# Patient Record
Sex: Female | Born: 1975 | Race: White | Hispanic: No | Smoking: Current every day smoker
Health system: Southern US, Community
[De-identification: ages and names within clinical notes are randomized; demographics above are authoritative.]

## PROBLEM LIST (undated history)

## (undated) DIAGNOSIS — M51369 Other intervertebral disc degeneration, lumbar region without mention of lumbar back pain or lower extremity pain: Secondary | ICD-10-CM

## (undated) DIAGNOSIS — J309 Allergic rhinitis, unspecified: Secondary | ICD-10-CM

## (undated) DIAGNOSIS — M5030 Other cervical disc degeneration, unspecified cervical region: Secondary | ICD-10-CM

## (undated) DIAGNOSIS — F419 Anxiety disorder, unspecified: Secondary | ICD-10-CM

## (undated) DIAGNOSIS — F329 Major depressive disorder, single episode, unspecified: Secondary | ICD-10-CM

## (undated) DIAGNOSIS — I456 Pre-excitation syndrome: Secondary | ICD-10-CM

## (undated) DIAGNOSIS — IMO0002 Reserved for concepts with insufficient information to code with codable children: Secondary | ICD-10-CM

## (undated) DIAGNOSIS — M419 Scoliosis, unspecified: Secondary | ICD-10-CM

## (undated) DIAGNOSIS — M5136 Other intervertebral disc degeneration, lumbar region: Secondary | ICD-10-CM

## (undated) DIAGNOSIS — F32A Depression, unspecified: Secondary | ICD-10-CM

## (undated) DIAGNOSIS — E785 Hyperlipidemia, unspecified: Secondary | ICD-10-CM

## (undated) DIAGNOSIS — G43909 Migraine, unspecified, not intractable, without status migrainosus: Secondary | ICD-10-CM

## (undated) DIAGNOSIS — M797 Fibromyalgia: Secondary | ICD-10-CM

## (undated) HISTORY — DX: Allergic rhinitis, unspecified: J30.9

## (undated) HISTORY — PX: OTHER SURGICAL HISTORY: SHX169

## (undated) HISTORY — DX: Major depressive disorder, single episode, unspecified: F32.9

## (undated) HISTORY — DX: Hyperlipidemia, unspecified: E78.5

## (undated) HISTORY — DX: Anxiety disorder, unspecified: F41.9

## (undated) HISTORY — PX: KNEE SURGERY: SHX244

## (undated) HISTORY — PX: HERNIA REPAIR: SHX51

## (undated) HISTORY — DX: Depression, unspecified: F32.A

## (undated) HISTORY — PX: EYE SURGERY: SHX253

## (undated) HISTORY — PX: TUBAL LIGATION: SHX77

---

## 2012-07-11 ENCOUNTER — Encounter (HOSPITAL_COMMUNITY): Payer: Self-pay | Admitting: Emergency Medicine

## 2012-07-11 ENCOUNTER — Emergency Department (HOSPITAL_COMMUNITY): Payer: Medicaid Other

## 2012-07-11 DIAGNOSIS — IMO0002 Reserved for concepts with insufficient information to code with codable children: Secondary | ICD-10-CM | POA: Insufficient documentation

## 2012-07-11 DIAGNOSIS — Y929 Unspecified place or not applicable: Secondary | ICD-10-CM | POA: Insufficient documentation

## 2012-07-11 DIAGNOSIS — R51 Headache: Secondary | ICD-10-CM | POA: Insufficient documentation

## 2012-07-11 DIAGNOSIS — S8990XA Unspecified injury of unspecified lower leg, initial encounter: Secondary | ICD-10-CM | POA: Insufficient documentation

## 2012-07-11 DIAGNOSIS — S99929A Unspecified injury of unspecified foot, initial encounter: Secondary | ICD-10-CM | POA: Insufficient documentation

## 2012-07-11 DIAGNOSIS — Z9889 Other specified postprocedural states: Secondary | ICD-10-CM | POA: Insufficient documentation

## 2012-07-11 DIAGNOSIS — F17200 Nicotine dependence, unspecified, uncomplicated: Secondary | ICD-10-CM | POA: Insufficient documentation

## 2012-07-11 DIAGNOSIS — R209 Unspecified disturbances of skin sensation: Secondary | ICD-10-CM | POA: Insufficient documentation

## 2012-07-11 DIAGNOSIS — Z91040 Latex allergy status: Secondary | ICD-10-CM | POA: Insufficient documentation

## 2012-07-11 DIAGNOSIS — G43909 Migraine, unspecified, not intractable, without status migrainosus: Secondary | ICD-10-CM | POA: Insufficient documentation

## 2012-07-11 DIAGNOSIS — Z8739 Personal history of other diseases of the musculoskeletal system and connective tissue: Secondary | ICD-10-CM | POA: Insufficient documentation

## 2012-07-11 DIAGNOSIS — Y9389 Activity, other specified: Secondary | ICD-10-CM | POA: Insufficient documentation

## 2012-07-11 DIAGNOSIS — Z8679 Personal history of other diseases of the circulatory system: Secondary | ICD-10-CM | POA: Insufficient documentation

## 2012-07-11 NOTE — ED Notes (Signed)
PT. REPORTS SON SAT ON HER LEFT KNEE THIS EVENING FELT SUDDEN PAIN AT LEFT KNEE WITH SLIGHT SWELLING .

## 2012-07-12 ENCOUNTER — Emergency Department (HOSPITAL_COMMUNITY)
Admission: EM | Admit: 2012-07-12 | Discharge: 2012-07-12 | Disposition: A | Discharge status: Home / Self Care | Payer: Medicaid Other | Attending: Emergency Medicine | Admitting: Emergency Medicine

## 2012-07-12 DIAGNOSIS — M25562 Pain in left knee: Secondary | ICD-10-CM

## 2012-07-12 DIAGNOSIS — G43909 Migraine, unspecified, not intractable, without status migrainosus: Secondary | ICD-10-CM

## 2012-07-12 HISTORY — DX: Fibromyalgia: M79.7

## 2012-07-12 HISTORY — DX: Migraine, unspecified, not intractable, without status migrainosus: G43.909

## 2012-07-12 HISTORY — DX: Pre-excitation syndrome: I45.6

## 2012-07-12 HISTORY — DX: Other cervical disc degeneration, unspecified cervical region: M50.30

## 2012-07-12 MED ORDER — HYDROCODONE-ACETAMINOPHEN 5-325 MG PO TABS: 1.0000 | ORAL_TABLET | ORAL | Status: DC | PRN

## 2012-07-12 MED ORDER — PROMETHAZINE HCL 25 MG/ML IJ SOLN
25.0000 mg | Freq: Once | INTRAMUSCULAR | Status: AC
  Administered 2012-07-12: 25 mg via INTRAMUSCULAR
  Filled 2012-07-12: qty 1

## 2012-07-12 MED ORDER — HYDROCODONE-ACETAMINOPHEN 5-325 MG PO TABS
2.0000 | ORAL_TABLET | Freq: Once | ORAL | Status: AC
  Administered 2012-07-12: 2 via ORAL
  Filled 2012-07-12: qty 2

## 2012-07-12 NOTE — ED Notes (Signed)
Pt dc to home.  Pt sts understanding to dc instructions. Pt ambulatory to exit without difficulty. Denies need for w/c. 

## 2012-07-12 NOTE — ED Provider Notes (Signed)
History     CSN: [id number]  Arrival date & time 07/11/12  2319   None     Chief Complaint  Patient presents with  . Knee Injury    (Consider location/radiation/quality/duration/timing/severity/associated sxs/prior treatment) HPI History provided by pt.   Pt presents w/ multiple complaints.  Has h/o 3 L knee surgeries in [place], where she recently relocated from.  Her son jumped on her while she was laying on her side with her knees together last night and it felt as though her L patella dislocated.  Has had severe pain ever since and is unable to bear weight.  Associated w/ tingling of lower leg.   Also c/o severe pain in top of head since yesterday.  Typical of her migraines.  Associated w/ photophobia.  Denies fever, dizziness, vision changes, N/V.  No relief w/ ibuprofen.  Denies head trauma.    Past Medical History  Diagnosis Date  . Fibromyalgia   . DDD (degenerative disc disease), cervical   . Migraine   . WPW (Wolff-Parkinson-White syndrome)     Past Surgical History  Procedure Laterality Date  . Knee surgery    . Cesarean section      No family history on file.  History  Substance Use Topics  . Smoking status: Current Every Day Smoker  . Smokeless tobacco: Not on file  . Alcohol Use: No    OB History   Grav Para Term Preterm Abortions TAB SAB Ect Mult Living                  Review of Systems  All other systems reviewed and are negative.    Allergies  Digoxin and related; Latex; and Toradol  Home Medications   Current Outpatient Rx  Name  Route  Sig  Dispense  Refill  . acetaminophen (TYLENOL) 325 MG tablet   Oral   Take 650 mg by mouth every 6 (six) hours as needed for pain (headache).         Marland Kitchen ibuprofen (ADVIL,MOTRIN) 200 MG tablet   Oral   Take 800 mg by mouth every 6 (six) hours as needed for pain or headache.         Marland Kitchen HYDROcodone-acetaminophen (NORCO/VICODIN) 5-325 MG per tablet   Oral   Take 1 tablet by mouth every 4 (four)  hours as needed for pain.   20 tablet   0     BP 140/91  Pulse 103  Temp(Src) 99.2 F (37.3 C) (Oral)  Resp 14  SpO2 99%  Physical Exam  Nursing note and vitals reviewed. Constitutional: She is oriented to person, place, and time. She appears well-developed and well-nourished.  Uncomfortable appearing  HENT:  Head: Normocephalic and atraumatic.  No tenderness of sinuses or temples.   Eyes:  Normal appearance  Neck: Normal range of motion. Neck supple. No rigidity. No Brudzinski's sign and no Kernig's sign noted.  No meningeal signs  Cardiovascular: Normal rate, regular rhythm and intact distal pulses.   Pulmonary/Chest: Effort normal and breath sounds normal.  Musculoskeletal: Normal range of motion.  L knee w/ multiple surgical scars anteriorly.  No edema, erythema or warmth of joint.  Diffuse ttp.  Pain w/ flexion past 30 deg.  No pain in knee w/ ROM of ankle.  2+ DP pulse and distal sensation intact.    Neurological: She is alert and oriented to person, place, and time. No sensory deficit. Coordination normal.  CN 3-12 intact.  No nystagmus.  5/5 and equal  upper and lower extremity strength.  No past pointing.    Skin: Skin is warm and dry. No rash noted.  Psychiatric: She has a normal mood and affect. Her behavior is normal.    ED Course  Procedures (including critical care time)  Labs Reviewed - No data to display Dg Knee Complete 4 Views Left  07/11/2012   *RADIOLOGY REPORT*  Clinical Data: Injured left knee.  History of prior surgery.  LEFT KNEE - COMPLETE 4+ VIEW  Comparison: None.  Findings: There are surgical changes with two screws in the proximal tibia.  The joint spaces are maintained.  Minimal degenerative changes.  No acute fracture or joint effusion.  IMPRESSION: No acute bony findings or significant degenerative changes. Postoperative changes involving the proximal tibia.   Original Report Authenticated By: Rudie Meyer, M.D.     1. Pain in left knee   2.  Migraine       MDM  36yo F, recently relocated from [place] where she was seeing a pain specialist, presents w/ multiple complaints.  Is experiencing a non-traumatic headache that is typical of her migraines.  On exam, well-appearing, afebrile, no focal neuro deficits or meningeal signs.  Also c/o injury to L knee.  H/o three surgeries on this knee.  No signs of joint infection, NV intact, xray neg for acute fx/dislocation.  Ortho tech placed in knee immobilizer and provided her with crutches.  Prescribed 20 vicodin and referred to ortho.  Return precautions discussed.           Otilio Miu, PA-C 07/12/12 [phone]

## 2012-07-12 NOTE — ED Notes (Signed)
PA at bedside.

## 2012-07-14 NOTE — ED Provider Notes (Signed)
Medical screening examination/treatment/procedure(s) were performed by non-physician practitioner and as supervising physician I was immediately available for consultation/collaboration.  Leetta Hendriks K Kylinn Shropshire-Rasch, MD 07/14/12 0240 

## 2012-09-11 ENCOUNTER — Inpatient Hospital Stay (HOSPITAL_COMMUNITY)
Admission: EM | Admit: 2012-09-11 | Discharge: 2012-09-15 | DRG: 103 | Disposition: A | Discharge status: Home / Self Care | Payer: Medicaid Other | Attending: Family Medicine | Admitting: Family Medicine

## 2012-09-11 ENCOUNTER — Encounter (HOSPITAL_COMMUNITY): Payer: Self-pay | Admitting: Emergency Medicine

## 2012-09-11 DIAGNOSIS — D72829 Elevated white blood cell count, unspecified: Secondary | ICD-10-CM | POA: Diagnosis present

## 2012-09-11 DIAGNOSIS — G43909 Migraine, unspecified, not intractable, without status migrainosus: Principal | ICD-10-CM | POA: Diagnosis present

## 2012-09-11 DIAGNOSIS — Z888 Allergy status to other drugs, medicaments and biological substances status: Secondary | ICD-10-CM

## 2012-09-11 DIAGNOSIS — R110 Nausea: Secondary | ICD-10-CM | POA: Diagnosis present

## 2012-09-11 DIAGNOSIS — F17200 Nicotine dependence, unspecified, uncomplicated: Secondary | ICD-10-CM | POA: Diagnosis present

## 2012-09-11 DIAGNOSIS — I456 Pre-excitation syndrome: Secondary | ICD-10-CM | POA: Diagnosis present

## 2012-09-11 DIAGNOSIS — IMO0001 Reserved for inherently not codable concepts without codable children: Secondary | ICD-10-CM | POA: Diagnosis present

## 2012-09-11 DIAGNOSIS — R52 Pain, unspecified: Secondary | ICD-10-CM

## 2012-09-11 DIAGNOSIS — Z720 Tobacco use: Secondary | ICD-10-CM

## 2012-09-11 MED ORDER — DIPHENHYDRAMINE HCL 50 MG/ML IJ SOLN
50.0000 mg | Freq: Once | INTRAMUSCULAR | Status: AC
  Administered 2012-09-11: 50 mg via INTRAVENOUS
  Filled 2012-09-11: qty 1

## 2012-09-11 MED ORDER — METOCLOPRAMIDE HCL 5 MG/ML IJ SOLN
10.0000 mg | Freq: Once | INTRAMUSCULAR | Status: AC
  Administered 2012-09-11: 10 mg via INTRAVENOUS
  Filled 2012-09-11: qty 2

## 2012-09-11 MED ORDER — SODIUM CHLORIDE 0.9 % IV BOLUS (SEPSIS)
1000.0000 mL | Freq: Once | INTRAVENOUS | Status: AC
  Administered 2012-09-11: 1000 mL via INTRAVENOUS

## 2012-09-11 MED ORDER — PROCHLORPERAZINE EDISYLATE 5 MG/ML IJ SOLN
10.0000 mg | Freq: Once | INTRAMUSCULAR | Status: AC
  Administered 2012-09-11: 10 mg via INTRAVENOUS
  Filled 2012-09-11: qty 2

## 2012-09-11 MED ORDER — MAGNESIUM SULFATE 40 MG/ML IJ SOLN
2.0000 g | Freq: Once | INTRAMUSCULAR | Status: AC
  Administered 2012-09-11: 2 g via INTRAVENOUS
  Filled 2012-09-11: qty 50

## 2012-09-11 MED ORDER — HYDROMORPHONE HCL PF 1 MG/ML IJ SOLN
1.0000 mg | Freq: Once | INTRAMUSCULAR | Status: AC
  Administered 2012-09-11: 1 mg via INTRAVENOUS
  Filled 2012-09-11: qty 1

## 2012-09-11 MED ORDER — ONDANSETRON HCL 4 MG/2ML IJ SOLN
4.0000 mg | Freq: Once | INTRAMUSCULAR | Status: AC
  Administered 2012-09-11: 4 mg via INTRAVENOUS
  Filled 2012-09-11: qty 2

## 2012-09-11 NOTE — ED Notes (Signed)
EDP at bedside  

## 2012-09-11 NOTE — ED Provider Notes (Signed)
History    CSN: [id number] Arrival date & time 09/11/12  Avon Gully  First MD Initiated Contact with Patient 09/11/12 1912     Chief Complaint  Patient presents with  . Headache   (Consider location/radiation/quality/duration/timing/severity/associated sxs/prior Treatment) HPI Comments: 37 yo female from [place], recently moved, hx of migraines, fibromyalgia, on pain management in [place], WPW presents with migraine headache for 5 days.  Similar to multiple previous, various triggers.  HA took over 12 hrs to reach maximal onset.  No fevers/ neck stiffness or injury.  Nothing improves, tried caffeine/ aleve.    Patient is a 37 y.o. female presenting with headaches. The history is provided by the patient.  Headache  Past Medical History  Diagnosis Date  . Fibromyalgia   . DDD (degenerative disc disease), cervical   . Migraine   . WPW (Wolff-Parkinson-White syndrome)    Past Surgical History  Procedure Laterality Date  . Knee surgery    . Cesarean section     History reviewed. No pertinent family history. History  Substance Use Topics  . Smoking status: Current Every Day Smoker  . Smokeless tobacco: Not on file  . Alcohol Use: No   OB History   Grav Para Term Preterm Abortions TAB SAB Ect Mult Living                 Review of Systems  Neurological: Positive for headaches.    Allergies  Digoxin and related; Latex; and Toradol  Home Medications   Current Outpatient Rx  Name  Route  Sig  Dispense  Refill  . DiphenhydrAMINE HCl (BENADRYL PO)   Oral   Take 1 tablet by mouth every 6 (six) hours as needed (itching).          BP 136/70  Pulse 82  Temp(Src) 98.4 F (36.9 C) (Oral)  Resp 16  SpO2 97% Physical Exam  Nursing note and vitals reviewed. Constitutional: She is oriented to person, place, and time. She appears well-developed and well-nourished.  HENT:  Head: Normocephalic and atraumatic.  Eyes: Conjunctivae are normal. Right eye exhibits no  discharge. Left eye exhibits no discharge.  Neck: Normal range of motion. Neck supple. No tracheal deviation present.  Cardiovascular: Normal rate and regular rhythm.   Pulmonary/Chest: Effort normal and breath sounds normal.  Abdominal: Soft. She exhibits no distension. There is no tenderness. There is no guarding.  Musculoskeletal: She exhibits no edema.  Neurological: She is alert and oriented to person, place, and time. She has normal strength. No cranial nerve deficit or sensory deficit. GCS eye subscore is 4. GCS verbal subscore is 5. GCS motor subscore is 6.  5+ strength in UE and LE with f/e at major joints. Sensation to palpation intact in UE and LE. CNs 2-12 grossly intact.  EOMFI.  PERRL.   Finger nose and coordination intact bilateral.   Visual fields intact to finger testing.   Skin: Skin is warm. No rash noted.  Psychiatric: She has a normal mood and affect.    ED Course  Procedures (including critical care time) Difficult IV, multiple attempts-- US guided. Emergency Ultrasound Study:   Angiocath insertion Performed by: Enid Skeens  Consent: Verbal consent obtained. Risks and benefits: risks, benefits and alternatives were discussed Time out: Immediately prior to procedure a "time out" was called to verify the correct patient, procedure, equipment, support staff and site/side marked as required.  Preparation: Patient was prepped and draped in the usual sterile fashion.  Vein Location: left deep  brachial/ AC patent, visualized Ultrasound Guided  Gauge: long 20 g  Normal blood return and flush without difficulty Patient tolerance: Patient tolerated the procedure well with no immediate complications.     Labs Reviewed  CBC WITH DIFFERENTIAL  BASIC METABOLIC PANEL  PREGNANCY, URINE   No results found. 1. Intractable headache   2. Inadequate pain control     MDM  HPI and exam not consistent with SAH or infectious.  Difficulty controlling pain in  ED. Pt explained she normally requires 5-6 mg dilaudid for HA.  Explained narcotics can decrease respirations And cause rebound HA.   Recheck, mild improvement.   Discussed with Dr Toniann Fail, observation placed, labs added.   Enid Skeens, MD 09/12/12 [phone]

## 2012-09-11 NOTE — ED Notes (Signed)
Pt c/o migraine HA x 1 week with nausea and photophobia

## 2012-09-12 ENCOUNTER — Encounter (HOSPITAL_COMMUNITY): Payer: Self-pay | Admitting: Internal Medicine

## 2012-09-12 ENCOUNTER — Inpatient Hospital Stay (HOSPITAL_COMMUNITY): Payer: Medicaid Other

## 2012-09-12 DIAGNOSIS — I456 Pre-excitation syndrome: Secondary | ICD-10-CM | POA: Diagnosis present

## 2012-09-12 DIAGNOSIS — R51 Headache: Secondary | ICD-10-CM

## 2012-09-12 DIAGNOSIS — Z720 Tobacco use: Secondary | ICD-10-CM | POA: Diagnosis present

## 2012-09-12 LAB — COMPREHENSIVE METABOLIC PANEL
ALT: 31 U/L (ref 0–35)
AST: 28 U/L (ref 0–37)
CO2: 22 mEq/L (ref 19–32)
Chloride: 102 mEq/L (ref 96–112)
Creatinine, Ser: 0.61 mg/dL (ref 0.50–1.10)
GFR calc non Af Amer: >90 mL/min (ref >90)
Glucose, Bld: 104 mg/dL — ABNORMAL HIGH (ref 70–99)
Sodium: 136 mEq/L (ref 135–145)
Total Bilirubin: 0.3 mg/dL (ref 0.3–1.2)

## 2012-09-12 LAB — CBC WITH DIFFERENTIAL/PLATELET
Basophils Absolute: 0.1 10*3/uL (ref 0.0–0.1)
Basophils Relative: 1 % (ref 0–1)
Hemoglobin: 13 g/dL (ref 12.0–15.0)
MCHC: 33.9 g/dL (ref 30.0–36.0)
Monocytes Relative: 4 % (ref 3–12)
Neutro Abs: 5.3 10*3/uL (ref 1.7–7.7)
Neutrophils Relative %: 58 % (ref 43–77)
Platelets: 208 10*3/uL (ref 150–400)

## 2012-09-12 LAB — BASIC METABOLIC PANEL
BUN: 4 mg/dL — ABNORMAL LOW (ref 6–23)
Chloride: 102 mEq/L (ref 96–112)
GFR calc Af Amer: >90 mL/min (ref >90)
Potassium: 4 mEq/L (ref 3.5–5.1)
Sodium: 136 mEq/L (ref 135–145)

## 2012-09-12 LAB — CBC
MCH: 29.3 pg (ref 26.0–34.0)
MCHC: 32.9 g/dL (ref 30.0–36.0)
Platelets: 199 10*3/uL (ref 150–400)

## 2012-09-12 LAB — POCT PREGNANCY, URINE: Preg Test, Ur: NEGATIVE

## 2012-09-12 MED ORDER — ONDANSETRON HCL 4 MG/2ML IJ SOLN
4.0000 mg | Freq: Four times a day (QID) | INTRAMUSCULAR | Status: DC | PRN
  Administered 2012-09-12 – 2012-09-14 (×8): 4 mg via INTRAVENOUS
  Filled 2012-09-12 (×9): qty 2

## 2012-09-12 MED ORDER — VALPROATE SODIUM 500 MG/5ML IV SOLN
500.0000 mg | Freq: Two times a day (BID) | INTRAVENOUS | Status: DC
  Filled 2012-09-12 (×2): qty 5

## 2012-09-12 MED ORDER — ONDANSETRON HCL 4 MG PO TABS
4.0000 mg | ORAL_TABLET | Freq: Four times a day (QID) | ORAL | Status: DC | PRN
  Administered 2012-09-14 – 2012-09-15 (×2): 4 mg via ORAL
  Filled 2012-09-12 (×2): qty 1

## 2012-09-12 MED ORDER — HYDROMORPHONE HCL PF 1 MG/ML IJ SOLN
1.0000 mg | INTRAMUSCULAR | Status: DC | PRN
  Administered 2012-09-12 – 2012-09-13 (×8): 1 mg via INTRAVENOUS
  Filled 2012-09-12 (×8): qty 1

## 2012-09-12 MED ORDER — ACETAMINOPHEN 650 MG RE SUPP: 650.0000 mg | Freq: Four times a day (QID) | RECTAL | Status: DC | PRN

## 2012-09-12 MED ORDER — BUTALBITAL-APAP-CAFFEINE 50-325-40 MG PO TABS
1.0000 | ORAL_TABLET | Freq: Once | ORAL | Status: AC
  Administered 2012-09-12: 1 via ORAL
  Filled 2012-09-12: qty 1

## 2012-09-12 MED ORDER — BUTALBITAL-APAP-CAFFEINE 50-325-40 MG PO TABS
1.0000 | ORAL_TABLET | Freq: Four times a day (QID) | ORAL | Status: DC | PRN
  Administered 2012-09-12 – 2012-09-15 (×9): 1 via ORAL
  Filled 2012-09-12 (×9): qty 1

## 2012-09-12 MED ORDER — ACETAMINOPHEN 325 MG PO TABS: 650.0000 mg | ORAL_TABLET | Freq: Four times a day (QID) | ORAL | Status: DC | PRN

## 2012-09-12 MED ORDER — VALPROATE SODIUM 500 MG/5ML IV SOLN
1000.0000 mg | Freq: Once | INTRAVENOUS | Status: AC
  Administered 2012-09-12: 1000 mg via INTRAVENOUS
  Filled 2012-09-12: qty 10

## 2012-09-12 MED ORDER — SODIUM CHLORIDE 0.9 % IV SOLN
INTRAVENOUS | Status: AC
  Administered 2012-09-12 (×2): via INTRAVENOUS

## 2012-09-12 NOTE — Progress Notes (Signed)
   CARE MANAGEMENT NOTE 09/12/2012  Patient:  Brooke [name], Brooke [name]   Account Number:  [id number]  Date Initiated:  09/12/2012  Documentation initiated by:  Jiles Crocker  Subjective/Objective Assessment:   ADMITTED WITH MIGRAINES/ NAUSEA     Action/Plan:   INDEPENDENT PRIOR TO ADMISSION; CM FOLLOWING FOR DCP   Anticipated DC Date:  09/14/2012   Anticipated DC Plan:  HOME/SELF CARE  In-house referral  Financial Counselor      DC Planning Services  CM consult          Status of service:  In process, will continue to follow Medicare Important Message given?  NA - LOS <3 / Initial given by admissions (If response is "NO", the following Medicare IM given date fields will be blank)  Per UR Regulation:  Reviewed for med. necessity/level of care/duration of stay  Comments:  09/12/2012- B Abid Bolla RN,BSN,MHA

## 2012-09-12 NOTE — ED Notes (Signed)
Pt returned from CT °

## 2012-09-12 NOTE — Progress Notes (Addendum)
Patient was seen and evaluated earlier this AM by my associate.  Patient reports feeling better at this moment.  Please refer to my associates H and P for further details regarding assessment and plan.  CT of head negative per reports  Discussed briefly with neurology. If patient is still having migraine headaches refractory to current pain regimen may consider Depakote.  Will reassess next am  Parry Po, Pamala Hurry  Was called in the evening as patient was still complaining of pain.  Depakote was administered per my discussion with Neurologist.  Should pain be persistent despite depakote administration will consult Neurologist in the AM.

## 2012-09-12 NOTE — ED Notes (Signed)
Pt waiting for CT before going to the floor.

## 2012-09-12 NOTE — H&P (Signed)
[facility] History and Physical  Brooke [name] ZOX:[id number] DOB: [birth year] DOA: 09/11/2012  Referring physician: ER physician. PCP: No PCP Per Patient  Chief Complaint: Headache.  HPI: Brooke [name] is a 37 y.o. female with history of migraine and fibromyalgia and WPW syndrome who has not been having her medications for last 9 months due to insurance issues presented to the ER because of persistent headache for the last 3 days with photophobia and nausea. Headache is mostly in the vertex of her head and patient states it is typical of her migraine. Despite giving patient multiple does of pain in his medications including Compazine and Reglan patient still has headache. Patient has been admitted for further management. Patient denies any blurred vision focal deficits any difficulty swallowing or speaking.  Review of Systems: As presented in the history of presenting illness, rest negative.  Past Medical History  Diagnosis Date  . Fibromyalgia   . DDD (degenerative disc disease), cervical   . Migraine   . WPW (Wolff-Parkinson-White syndrome)    Past Surgical History  Procedure Laterality Date  . Knee surgery    . Cesarean section     Social History:  reports that she has been smoking.  She does not have any smokeless tobacco history on file. She reports that she does not drink alcohol or use illicit drugs. Home. where does patient live-- Can do ADLs. Can patient participate in ADLs?  Allergies  Allergen Reactions  . Digoxin And Related Nausea And Vomiting    Elevated 'white count'  . Latex Hives and Swelling  . Toradol (Ketorolac Tromethamine) Other (See Comments)    Uncontrollable 'shakes'    Family History  Problem Relation Age of Onset  . Migraines Mother   . CAD Father   . Migraines Daughter       Prior to Admission medications   Medication Sig Start Date End Date Taking? Authorizing Provider  DiphenhydrAMINE HCl (BENADRYL PO) Take 1 tablet by mouth every  6 (six) hours as needed (itching).   Yes Historical Provider, MD   Physical Exam: Filed Vitals:   09/11/12 1855 09/11/12 2036 09/11/12 2230 09/11/12 2343  BP: 155/102 127/88 129/86 136/70  Pulse: 114 86 66 82  Temp: 98.4 F (36.9 C)     TempSrc: Oral     Resp: 20 16 16 16   SpO2: 98% 99% 97% 97%     General:  Well-developed and nourished.  Eyes: Anicteric no pallor.  ENT: No discharge from ears eyes nose mouth.  Neck: No mass felt. No neck rigidity.  Cardiovascular: S1-S2 heard.  Respiratory: No rhonchi or crepitations.  Abdomen: Soft nontender bowel sounds present.  Skin: No rash.  Musculoskeletal: No edema.  Psychiatric: Is normal.  Neurologic: Alert awake oriented to time place and person. Moves all extremities 5 x 5. No facial asymmetry. Tongue is midline.  Labs on Admission:  Basic Metabolic Panel: No results found for this basename: NA, K, CL, CO2, GLUCOSE, BUN, CREATININE, CALCIUM, MG, PHOS,  in the last 168 hours Liver Function Tests: No results found for this basename: AST, ALT, ALKPHOS, BILITOT, PROT, ALBUMIN,  in the last 168 hours No results found for this basename: LIPASE, AMYLASE,  in the last 168 hours No results found for this basename: AMMONIA,  in the last 168 hours CBC: No results found for this basename: WBC, NEUTROABS, HGB, HCT, MCV, PLT,  in the last 168 hours Cardiac Enzymes: No results found for this basename: CKTOTAL, CKMB, CKMBINDEX, TROPONINI,  in the  last 168 hours  BNP (last 3 results) No results found for this basename: PROBNP,  in the last 8760 hours CBG: No results found for this basename: GLUCAP,  in the last 168 hours  Radiological Exams on Admission: No results found.  Assessment/Plan Principal Problem:   Headache Active Problems:   WPW syndrome   Tobacco abuse   1. Headache - with history of migraine. CT head is pending. I have written one dose of Fioricet. Patient has been also placed on when necessary Dilaudid. If  headache persist may consult neurologist in a.m. 2. History of WPW syndrome status post failed ablation as per patient - presently rate controlled. 3. Tobacco abuse - tobacco cessation counseling requested. 4. History of chronic pain and fibromyalgia.    Code Status: Full code.  Family Communication: None.  Disposition Plan: Admit to inpatient.    KAKRAKANDY,ARSHAD N. [facility] Pager [phone].  If 7PM-7AM, please contact night-coverage [email or web address] Password [facility] 09/12/2012, 12:31 AM

## 2012-09-12 NOTE — ED Notes (Signed)
Pt transported to CT ?

## 2012-09-13 DIAGNOSIS — F17200 Nicotine dependence, unspecified, uncomplicated: Secondary | ICD-10-CM

## 2012-09-13 DIAGNOSIS — R52 Pain, unspecified: Secondary | ICD-10-CM

## 2012-09-13 DIAGNOSIS — I456 Pre-excitation syndrome: Secondary | ICD-10-CM

## 2012-09-13 MED ORDER — METHOCARBAMOL 100 MG/ML IJ SOLN
500.0000 mg | Freq: Four times a day (QID) | INTRAVENOUS | Status: DC | PRN
  Administered 2012-09-13 – 2012-09-14 (×3): 500 mg via INTRAVENOUS
  Filled 2012-09-13 (×5): qty 5

## 2012-09-13 MED ORDER — LORATADINE 10 MG PO TABS
10.0000 mg | ORAL_TABLET | Freq: Every day | ORAL | Status: DC | PRN
  Administered 2012-09-13: 10 mg via ORAL
  Filled 2012-09-13 (×2): qty 1

## 2012-09-13 MED ORDER — PROMETHAZINE HCL 25 MG/ML IJ SOLN
12.5000 mg | Freq: Once | INTRAMUSCULAR | Status: AC
  Administered 2012-09-13: 12.5 mg via INTRAVENOUS
  Filled 2012-09-13: qty 1

## 2012-09-13 MED ORDER — HYDROMORPHONE HCL PF 1 MG/ML IJ SOLN
INTRAMUSCULAR | Status: AC
  Filled 2012-09-13: qty 2

## 2012-09-13 MED ORDER — NAPROXEN 500 MG PO TABS
500.0000 mg | ORAL_TABLET | Freq: Three times a day (TID) | ORAL | Status: DC
  Administered 2012-09-13 – 2012-09-15 (×6): 500 mg via ORAL
  Filled 2012-09-13 (×10): qty 1

## 2012-09-13 MED ORDER — HYDROMORPHONE HCL PF 1 MG/ML IJ SOLN
2.0000 mg | INTRAMUSCULAR | Status: DC | PRN
  Administered 2012-09-13 – 2012-09-15 (×10): 2 mg via INTRAVENOUS
  Filled 2012-09-13 (×9): qty 2

## 2012-09-13 MED ORDER — MORPHINE SULFATE 2 MG/ML IJ SOLN
2.0000 mg | INTRAMUSCULAR | Status: DC | PRN
  Administered 2012-09-13: 2 mg via INTRAVENOUS
  Filled 2012-09-13: qty 1

## 2012-09-13 MED ORDER — LORATADINE 10 MG PO TABS: 10.0000 mg | ORAL_TABLET | Freq: Every day | ORAL | Status: DC

## 2012-09-13 NOTE — Progress Notes (Signed)
[facility] PROGRESS NOTE  Brooke [name] WUJ:[id number] DOB: 11-May-1975 DOA: 09/11/2012 PCP: No PCP Per Patient  Assessment/Plan: 1. Headache - with history of migraine. CT head is negative. Discussed with Neurologist yesterday and tried dose of depakote. Patient still complaining of HA as such consulted Neurology today 7/24 for further evaluation and recommendations.  2. History of WPW syndrome status post failed ablation as per patient - presently rate controlled. 3. Tobacco abuse - tobacco cessation counseling requested. 4. History of chronic pain and fibromyalgia. May be contributing to # 1. 5. Leukocytosis: suspect is from stress demarginalization, no fevers currently  Code Status: full Family Communication: No familiy at bedside. Disposition Plan: Pending further evaluation and recommendations from Neurology  Consultants:  Neurology  Procedures:  CT of head  Antibiotics:  None  HPI/Subjective: Patient had depakote yesterday but she reports that this did not help her pain out.  Still complaining of discomfort.  Fioricet and dilaudid on admission. Dilaudid discontinued today.   Objective: Filed Vitals:   09/12/12 1900 09/12/12 2141 09/13/12 0124 09/13/12 0605  BP: 115/78 141/82 150/78 113/62  Pulse: 80 78 69 67  Temp: 98.2 F (36.8 C) 98.5 F (36.9 C) 98.1 F (36.7 C) 97.7 F (36.5 C)  TempSrc: Oral Oral Oral Oral  Resp: 18 16 18 18   Height:      Weight:      SpO2: 97% 95% 97% 97%   No intake or output data in the 24 hours ending 09/13/12 0919 Filed Weights   09/12/12 0203  Weight: 86.274 kg (190 lb 3.2 oz)    Exam:   General:  Pt in NAD, looks uncomfortable  Cardiovascular: RRR, no MRG  Respiratory: CTA BL, no wheezes  Abdomen: soft, NT, ND  Musculoskeletal: no cyanosis or clubbing.   Data Reviewed: Basic Metabolic Panel:  Recent Labs Lab 09/11/12 2348 09/12/12 0535  NA 136 136  K 4.0 4.0  CL 102 102  CO2 24 22  GLUCOSE 86 104*   BUN 4* 4*  CREATININE 0.67 0.61  CALCIUM 8.6 8.8   Liver Function Tests:  Recent Labs Lab 09/12/12 0535  AST 28  ALT 31  ALKPHOS 90  BILITOT 0.3  PROT 7.0  ALBUMIN 3.4*   No results found for this basename: LIPASE, AMYLASE,  in the last 168 hours No results found for this basename: AMMONIA,  in the last 168 hours CBC:  Recent Labs Lab 09/11/12 2348 09/12/12 0535  WBC 9.0 11.1*  NEUTROABS 5.3  --   HGB 13.0 12.8  HCT 38.4 38.9  MCV 88.9 89.0  PLT 208 199   Cardiac Enzymes: No results found for this basename: CKTOTAL, CKMB, CKMBINDEX, TROPONINI,  in the last 168 hours BNP (last 3 results) No results found for this basename: PROBNP,  in the last 8760 hours CBG: No results found for this basename: GLUCAP,  in the last 168 hours  No results found for this or any previous visit (from the past 240 hour(s)).   Studies: Ct Head Wo Contrast  09/12/2012   *RADIOLOGY REPORT*  Clinical Data: Migraine headaches for 5 days.  History migraine headaches.  CT HEAD WITHOUT CONTRAST  Technique:  Contiguous axial images were obtained from the base of the skull through the vertex without contrast.  Comparison: None.  Findings: No acute to cortical infarct, hemorrhage, or mass lesion is present.  The ventricles are of normal size.  No significant extra-axial fluid collection is present.  Leftward nasal septal deviation is present.  The paranasal sinuses and mastoid air cells are clear.  IMPRESSION: Negative CT of the head.   Original Report Authenticated By: Marin Roberts, M.D.    Scheduled Meds:  Continuous Infusions:   Principal Problem:   Headache Active Problems:   WPW syndrome   Tobacco abuse    Time spent: > 35 minutes    Penny Pia  [facility] Pager [phone] If 7PM-7AM, please contact night-coverage at [email or web address], password [facility] 09/13/2012, 9:19 AM  LOS: 2 days

## 2012-09-13 NOTE — Progress Notes (Signed)
Pt. Upset; crying. Rates pain 10 out of 10; still c/o nausea. Not time for Fiorcet, Morphine, or Zofran; awaiting Robaxin from pharmacy. Per pt. Morphine did "nothing." Paged Onalee Hua, [place] with no response. Paged Dr. Cena Benton who requested Neuro be re-paged. Call placed to Dr. Roseanne Reno, new orders received. Dilaudid 2mg  given IVP. Pt. Remains upset about nausea situation; explained no orders received but will give Zofran as soon as it is time. Charge RN also in to speak with patient. Will monitor.

## 2012-09-13 NOTE — Consult Note (Signed)
NEURO HOSPITALIST CONSULT NOTE    Reason for Consult:Migraine headache  HPI:                                                                                                                                          Brooke [name] is an 37 y.o. female with a history of migraine headaches for >12 years who complains of having a headache for 4 days that she describes as similar to her past migraines .  The headache has been constant for 4 days and she describes it as pulsating on the top of her head, non-radiating, with light sensitivity and nausea.  She states prior to the onset of her headache she had an aura which she says was yawning, which is typical for her.  She recently moved here from [place] and was seen in [place] at the pain clinic where she was prescibed "morphine, Opana, zanaflex, mobic and topamax".  She has also tried "demerol and dilaudid" in the past, with no relief.  She states she has not been seen by a neurologist in [place] but has been seen by one in [place] "in the pain clinic".  She states in the past she has tried "botox, trigger point injections, birth control and caffeine" to prevent her headaches, and that they were not affective.  She states she stopped talking all of her medications this past December because she moved and did not have a doctor here. At this point has towel over eyes and stating only narcotics usually help her HA.   Past Medical History  Diagnosis Date  . Fibromyalgia   . DDD (degenerative disc disease), cervical   . Migraine   . WPW (Wolff-Parkinson-White syndrome)     Past Surgical History  Procedure Laterality Date  . Knee surgery    . Cesarean section      Family History  Problem Relation Age of Onset  . Migraines Mother   . CAD Father   . Migraines Daughter       Social History:  reports that she has been smoking.  She does not have any smokeless tobacco history on file. She reports that she does not drink alcohol or  use illicit drugs.  Allergies  Allergen Reactions  . Digoxin And Related Nausea And Vomiting    Elevated 'white count'  . Latex Hives and Swelling  . Toradol (Ketorolac Tromethamine) Other (See Comments)    Uncontrollable 'shakes'    MEDICATIONS:  Prior to Admission:  Prescriptions prior to admission  Medication Sig Dispense Refill  . DiphenhydrAMINE HCl (BENADRYL PO) Take 1 tablet by mouth every 6 (six) hours as needed (itching).       Scheduled:    ROS:                                                                                                                                       History obtained from the patient  General ROS: negative for - chills, fatigue, fever, night sweats, weight gain or weight loss Psychological ROS: negative for - behavioral disorder, hallucinations, memory difficulties, mood swings or suicidal ideation Ophthalmic ROS: negative for - blurry vision, double vision, eye pain or loss of vision ENT ROS: negative for - epistaxis, nasal discharge, oral lesions, sore throat, tinnitus or vertigo Allergy and Immunology ROS: negative for - hives or itchy/watery eyes Hematological and Lymphatic ROS: negative for - bleeding problems, bruising or swollen lymph nodes Endocrine ROS: negative for - galactorrhea, hair pattern changes, polydipsia/polyuria or temperature intolerance Respiratory ROS: negative for - cough, hemoptysis, shortness of breath or wheezing Cardiovascular ROS: negative for - chest pain, dyspnea on exertion, edema or irregular heartbeat Gastrointestinal ROS: negative for - abdominal pain, diarrhea, hematemesis, nausea/vomiting or stool incontinence Genito-Urinary ROS: negative for - dysuria, hematuria, incontinence or urinary frequency/urgency Musculoskeletal ROS: negative for - joint swelling or muscular weakness Neurological ROS: as  noted in HPI Dermatological ROS: negative for rash and skin lesion changes   Blood pressure 126/84, pulse 80, temperature 98.2 F (36.8 C), temperature source Oral, resp. rate 18, height 5\' 4"  (1.626 m), weight 190 lb 3.2 oz (86.274 kg), SpO2 99.00%.   Neurologic Examination:                                                                                                      Mental Status: Alert, oriented, thought content appropriate.  Speech fluent without evidence of aphasia.  Able to follow 3 step commands without difficulty. Cranial Nerves: II: Discs flat bilaterally; Visual fields grossly normal, pupils equal, round, reactive to light and accommodation III,IV, VI: ptosis not present, extra-ocular motions intact bilaterally V,VII: smile symmetric, facial light touch sensation normal bilaterally VIII: hearing normal bilaterally IX,X: gag reflex present XI: bilateral shoulder shrug XII: midline tongue extension Motor: Right : Upper extremity   5/5    Left:     Upper extremity   5/5  Lower extremity   5/5     Lower  extremity   5/5 Tone and bulk:normal tone throughout; no atrophy noted Sensory: Pinprick and light touch intact throughout, bilaterally Deep Tendon Reflexes:  Right: Upper Extremity   Left: Upper extremity   biceps (C-5 to C-6) 2/4   biceps (C-5 to C-6) 2/4 tricep (C7) 2/4    triceps (C7) 2/4 Brachioradialis (C6) 2/4  Brachioradialis (C6) 2/4  Lower Extremity Lower Extremity  quadriceps (L-2 to L-4) 2/4   quadriceps (L-2 to L-4) 2/4 Achilles (S1) 2/4   Achilles (S1) 2/4  Plantars: Right: downgoing   Left: downgoing Cerebellar: normal finger-to-nose,  normal heel-to-shin test Gait: Deferred CV: pulses palpable throughout    No results found for this basename: cbc, bmp, coags, chol, tri, ldl, hga1c    Results for orders placed during the hospital encounter of 09/11/12 (from the past 48 hour(s))  CBC WITH DIFFERENTIAL     Status: None   Collection Time     09/11/12 11:48 PM      Result Value Range   WBC 9.0  4.0 - 10.5 K/uL   RBC 4.32  3.87 - 5.11 MIL/uL   Hemoglobin 13.0  12.0 - 15.0 g/dL   HCT 16.1  09.6 - 04.5 %   MCV 88.9  78.0 - 100.0 fL   MCH 30.1  26.0 - 34.0 pg   MCHC 33.9  30.0 - 36.0 g/dL   RDW 40.9  81.1 - 91.4 %   Platelets 208  150 - 400 K/uL   Neutrophils Relative % 58  43 - 77 %   Neutro Abs 5.3  1.7 - 7.7 K/uL   Lymphocytes Relative 36  12 - 46 %   Lymphs Abs 3.3  0.7 - 4.0 K/uL   Monocytes Relative 4  3 - 12 %   Monocytes Absolute 0.4  0.1 - 1.0 K/uL   Eosinophils Relative 1  0 - 5 %   Eosinophils Absolute 0.1  0.0 - 0.7 K/uL   Basophils Relative 1  0 - 1 %   Basophils Absolute 0.1  0.0 - 0.1 K/uL  BASIC METABOLIC PANEL     Status: Abnormal   Collection Time    09/11/12 11:48 PM      Result Value Range   Sodium 136  135 - 145 mEq/L   Potassium 4.0  3.5 - 5.1 mEq/L   Chloride 102  96 - 112 mEq/L   CO2 24  19 - 32 mEq/L   Glucose, Bld 86  70 - 99 mg/dL   BUN 4 (*) 6 - 23 mg/dL   Creatinine, Ser 7.82  0.50 - 1.10 mg/dL   Calcium 8.6  8.4 - 95.6 mg/dL   GFR calc non Af Amer >90  >90 mL/min   GFR calc Af Amer >90  >90 mL/min   Comment:            The eGFR has been calculated     using the CKD EPI equation.     This calculation has not been     validated in all clinical     situations.     eGFR's persistently     <90 mL/min signify     possible Chronic Kidney Disease.  POCT PREGNANCY, URINE     Status: None   Collection Time    09/12/12  1:08 AM      Result Value Range   Preg Test, Ur NEGATIVE  NEGATIVE   Comment:            THE  SENSITIVITY OF THIS     METHODOLOGY IS >24 mIU/mL  COMPREHENSIVE METABOLIC PANEL     Status: Abnormal   Collection Time    09/12/12  5:35 AM      Result Value Range   Sodium 136  135 - 145 mEq/L   Potassium 4.0  3.5 - 5.1 mEq/L   Chloride 102  96 - 112 mEq/L   CO2 22  19 - 32 mEq/L   Glucose, Bld 104 (*) 70 - 99 mg/dL   BUN 4 (*) 6 - 23 mg/dL   Creatinine, Ser 2.13  0.50  - 1.10 mg/dL   Calcium 8.8  8.4 - 08.6 mg/dL   Total Protein 7.0  6.0 - 8.3 g/dL   Albumin 3.4 (*) 3.5 - 5.2 g/dL   AST 28  0 - 37 U/L   ALT 31  0 - 35 U/L   Alkaline Phosphatase 90  39 - 117 U/L   Total Bilirubin 0.3  0.3 - 1.2 mg/dL   GFR calc non Af Amer >90  >90 mL/min   GFR calc Af Amer >90  >90 mL/min   Comment:            The eGFR has been calculated     using the CKD EPI equation.     This calculation has not been     validated in all clinical     situations.     eGFR's persistently     <90 mL/min signify     possible Chronic Kidney Disease.  CBC     Status: Abnormal   Collection Time    09/12/12  5:35 AM      Result Value Range   WBC 11.1 (*) 4.0 - 10.5 K/uL   RBC 4.37  3.87 - 5.11 MIL/uL   Hemoglobin 12.8  12.0 - 15.0 g/dL   HCT 57.8  46.9 - 62.9 %   MCV 89.0  78.0 - 100.0 fL   MCH 29.3  26.0 - 34.0 pg   MCHC 32.9  30.0 - 36.0 g/dL   RDW 52.8  41.3 - 24.4 %   Platelets 199  150 - 400 K/uL    Ct Head Wo Contrast  09/12/2012   *RADIOLOGY REPORT*  Clinical Data: Migraine headaches for 5 days.  History migraine headaches.  CT HEAD WITHOUT CONTRAST  Technique:  Contiguous axial images were obtained from the base of the skull through the vertex without contrast.  Comparison: None.  Findings: No acute to cortical infarct, hemorrhage, or mass lesion is present.  The ventricles are of normal size.  No significant extra-axial fluid collection is present.  Leftward nasal septal deviation is present.  The paranasal sinuses and mastoid air cells are clear.  IMPRESSION: Negative CT of the head.   Original Report Authenticated By: Marin Roberts, M.D.     Assessment/Plan: 37 yo female with chronic history of migraines, previously treated by pain clinic with Opana and Morphine.  She recently moved from [place] to [place] and has had a 4 day history of migraine headache. As of now: -Dilaudid, depakote, reglan, benadryl, fioricet, phenergren, compazine, zofran have not been affective per  patient -Recommend:  1. Naproxen 500mg  q 8 hours po 2. Robaxin 500mg  q 6 hours prn IV 3. Morphine 2mg  q 4 hours IV 4. Will continue to follow  Felicie Morn PA-C [facility] [phone]  09/13/2012, 11:09 AM  I personally participated in this patient's evaluation and management, including formulating the above clinical impression and  management recommendations.  Venetia Maxon M.D. [facility] [phone]

## 2012-09-14 MED ORDER — LORAZEPAM 1 MG PO TABS
1.0000 mg | ORAL_TABLET | Freq: Two times a day (BID) | ORAL | Status: DC
  Administered 2012-09-14 (×2): 1 mg via ORAL
  Filled 2012-09-14 (×3): qty 1

## 2012-09-14 NOTE — Progress Notes (Signed)
Subjective: Patient continues to experience headaches of moderate to severe intensity, currently at 8/10. Nausea is better with Phenergan. Feel she is improving slowly with respect to headache management.  Objective: Current vital signs: BP 145/77  Pulse 79  Temp(Src) 97.7 F (36.5 C) (Oral)  Resp 16  Ht 5\' 4"  (1.626 m)  Wt 86.274 kg (190 lb 3.2 oz)  BMI 32.63 kg/m2  SpO2 98%  Neurologic Exam: Alert and in no apparent acute distress. Patient had her eye pad in the bed with her and also had television on. She was well oriented to time as well as place. Affect was depressed. Face was symmetrical and speech was normal. Patient moved extremities equally with normal symmetrical strength.  Medications: I have reviewed the patient's current medications.  Assessment/Plan: 37 year old lady with continued intractable headache, likely mixed headache type with vascular as well as muscle contraction (tension) components.  Recommend continuing current symptomatic management with Dilaudid IV as well as Robaxin and anti-inflammatory management with Naprosyn. Head and antianxiety agent, Xanax or Ativan and low-dose daily.  We will continue to follow this patient with you.  C.R. Roseanne Reno, MD [facility]  [phone]  09/14/2012  11:35 AM

## 2012-09-14 NOTE — Care Management Note (Signed)
    Page 1 of 1   09/14/2012     3:58:59 PM   CARE MANAGEMENT NOTE 09/14/2012  Patient:  Brooke [name], Brooke [name]   Account Number:  [id number]  Date Initiated:  09/12/2012  Documentation initiated by:  Jiles Crocker  Subjective/Objective Assessment:   ADMITTED WITH MIGRAINES/ NAUSEA     Action/Plan:   INDEPENDENT PRIOR TO ADMISSION; CM FOLLOWING FOR DCP   Anticipated DC Date:  09/14/2012   Anticipated DC Plan:  HOME/SELF CARE  In-house referral  Financial Counselor      DC Planning Services  CM consult      Choice offered to / List presented to:             Status of service:  In process, will continue to follow Medicare Important Message given?  NA - LOS <3 / Initial given by admissions (If response is "NO", the following Medicare IM given date fields will be blank) Date Medicare IM given:   Date Additional Medicare IM given:    Discharge Disposition:    Per UR Regulation:  Reviewed for med. necessity/level of care/duration of stay  If discussed at Long Length of Stay Meetings, dates discussed:    Comments:  09/14/12 1550 Elmer Bales RN, MSN, CM-  Met with patient regarding medication concerns.  Pt states that she no longer has issues now that her Medicaid has started.  Pt is aware that she needs to contact her assigned PCP on the back of her Medicaid card to establish care.  09/13/12 1330  Elmer Bales RN, MSN, CM-  Spoke with patient's RN, who reports that patient has insurance/medication concerns.  Pt's RN to update CM regarding when patient feels well enough to discuss.    09/12/2012- B CHANDLER RN,BSN,MHA

## 2012-09-14 NOTE — Progress Notes (Signed)
[facility] PROGRESS NOTE  Brooke [name] ZOX:[id number] DOB: 1975/08/30 DOA: 09/11/2012 PCP: No PCP Per Patient  Assessment/Plan: 1. Headache - with history of migraine. CT head is negative. Consulted neurology.  Dr. Roseanne Reno on board.  Currently will add anti anxiety medication. Had discussion with patient to indicate that our goal is to ameliorate her pain and not take it completely away.  2. History of WPW syndrome status post failed ablation as per patient - presently rate controlled. 3. Tobacco abuse - tobacco cessation counseling requested. 4. History of chronic pain and fibromyalgia. Most likely contributing to # 1. 5. Leukocytosis: suspect is from stress demarginalization, no fevers currently  Code Status: full Family Communication: No familiy at bedside. Disposition Plan: Pending further evaluation and recommendations from Neurology  Consultants:  Neurology  Procedures:  CT of head  Antibiotics:  None  HPI/Subjective: Pain is improved but patient reports its still difficult to tolerate. No acute issues reported overnight.  Objective: Filed Vitals:   09/13/12 1851 09/13/12 2212 09/14/12 0540 09/14/12 1527  BP: 125/69 121/79 145/77 112/57  Pulse: 83 77 79 73  Temp: 98.3 F (36.8 C) 97.8 F (36.6 C) 97.7 F (36.5 C)   TempSrc: Oral Oral Oral   Resp: 18 18 16 18   Height:      Weight:      SpO2: 98% 98% 98% 98%    Intake/Output Summary (Last 24 hours) at 09/14/12 1554 Last data filed at 09/13/12 1853  Gross per 24 hour  Intake    240 ml  Output      0 ml  Net    240 ml   Filed Weights   09/12/12 0203  Weight: 86.274 kg (190 lb 3.2 oz)    Exam:   General:  Pt in NAD, looks uncomfortable  Cardiovascular: RRR, no MRG  Respiratory: CTA BL, no wheezes  Abdomen: soft, NT, ND  Musculoskeletal: no cyanosis or clubbing.   Data Reviewed: Basic Metabolic Panel:  Recent Labs Lab 09/11/12 2348 09/12/12 0535  NA 136 136  K 4.0 4.0  CL 102  102  CO2 24 22  GLUCOSE 86 104*  BUN 4* 4*  CREATININE 0.67 0.61  CALCIUM 8.6 8.8   Liver Function Tests:  Recent Labs Lab 09/12/12 0535  AST 28  ALT 31  ALKPHOS 90  BILITOT 0.3  PROT 7.0  ALBUMIN 3.4*   No results found for this basename: LIPASE, AMYLASE,  in the last 168 hours No results found for this basename: AMMONIA,  in the last 168 hours CBC:  Recent Labs Lab 09/11/12 2348 09/12/12 0535  WBC 9.0 11.1*  NEUTROABS 5.3  --   HGB 13.0 12.8  HCT 38.4 38.9  MCV 88.9 89.0  PLT 208 199   Cardiac Enzymes: No results found for this basename: CKTOTAL, CKMB, CKMBINDEX, TROPONINI,  in the last 168 hours BNP (last 3 results) No results found for this basename: PROBNP,  in the last 8760 hours CBG: No results found for this basename: GLUCAP,  in the last 168 hours  No results found for this or any previous visit (from the past 240 hour(s)).   Studies: No results found.  Scheduled Meds: . LORazepam  1 mg Oral BID  . naproxen  500 mg Oral TID WC   Continuous Infusions:   Principal Problem:   Headache Active Problems:   WPW syndrome   Tobacco abuse    Time spent: > 35 minutes    Nahiara Kretzschmar, [facility]  [facility] [employer]  [phone] If 7PM-7AM, please contact night-coverage at [email or web address], password [facility] 09/14/2012, 3:54 PM  LOS: 3 days

## 2012-09-15 LAB — CBC
Platelets: 210 10*3/uL (ref 150–400)
RDW: 13.4 % (ref 11.5–15.5)
WBC: 7.3 10*3/uL (ref 4.0–10.5)

## 2012-09-15 MED ORDER — PROMETHAZINE HCL 12.5 MG PO TABS: 12.5000 mg | ORAL_TABLET | Freq: Four times a day (QID) | ORAL | Status: DC | PRN

## 2012-09-15 MED ORDER — LORAZEPAM 1 MG PO TABS: 1.0000 mg | ORAL_TABLET | Freq: Two times a day (BID) | ORAL | Status: DC

## 2012-09-15 MED ORDER — BUTALBITAL-APAP-CAFFEINE 50-325-40 MG PO TABS: 1.0000 | ORAL_TABLET | Freq: Four times a day (QID) | ORAL | Status: DC | PRN

## 2012-09-15 MED ORDER — CYCLOBENZAPRINE HCL 5 MG PO TABS: 5.0000 mg | ORAL_TABLET | Freq: Three times a day (TID) | ORAL | Status: DC | PRN

## 2012-09-15 MED ORDER — NAPROXEN 500 MG PO TABS: 500.0000 mg | ORAL_TABLET | Freq: Three times a day (TID) | ORAL | Status: DC

## 2012-09-15 NOTE — Progress Notes (Signed)
Subjective: Headache intensity is only slightly less than yesterday. Nausea is controlled with Phenergan. No significant overnight adverse events reported.  Objective: Current vital signs: BP 113/72  Pulse 78  Temp(Src) 97.8 F (36.6 C) (Oral)  Resp 16  Ht 5\' 4"  (1.626 m)  Wt 86.274 kg (190 lb 3.2 oz)  BMI 32.63 kg/m2  SpO2 97%  Neurologic Exam: Alert and in no apparent distress. Affect appears to be depressed. She had no complaints other than continued headache.  Medications: I have reviewed the patient's current medications.  Assessment/Plan: Intractable headache with only slight improvement with parenteral analgesic medications and muscle relaxant, as well as Naprosyn and antianxiety agent. At this point I believe this patient has reached maximum medical improvement. Also believe that symptoms are at least in part embellished.  Recommending discontinuing parenteral medications. Further hospitalization is not likely to be of significant benefit. She may continue analgesic medication as well as muscle relaxant, anti-inflammatory agent and antianxiety agent on an outpatient basis.  C.R. Roseanne Reno, MD [facility] [phone]  09/15/2012  9:09 AM

## 2012-09-15 NOTE — Progress Notes (Signed)
Health education given, educate about each prescription and gave to the patient. IV removed, pt has no questions. Will roll her down in wheelchair. No concerns.  [place]

## 2012-09-15 NOTE — Discharge Summary (Signed)
Physician Discharge Summary  Brooke [name] ZOX:[id number] DOB: [birth year] DOA: 09/11/2012  PCP: No PCP Per Patient  Admit date: 09/11/2012 Discharge date: 09/15/2012  Time spent: > 35 minutes  Recommendations for Outpatient Follow-up:  1. Set up appointment with pain specialist for further evaluation and recommendations. 2. Please see recommendations from neurologist listed above regarding Migraine headaches  Discharge Diagnoses:  Principal Problem:   Headache Active Problems:   WPW syndrome   Tobacco abuse   Discharge Condition: stable  Diet recommendation: low sodium heart healthy  Filed Weights   09/12/12 0203  Weight: 86.274 kg (190 lb 3.2 oz)    History of present illness:  From original HPI: Brooke [name] is a 37 y.o. female with history of migraine and fibromyalgia and WPW syndrome who has not been having her medications for last 9 months due to insurance issues presented to the ER because of persistent headache for the last 3 days with photophobia and nausea. Headache is mostly in the vertex of her head and patient states it is typical of her migraine. Despite giving patient multiple does of pain in his medications including Compazine and Reglan patient still has headache. Patient has been admitted for further management. Patient denies any blurred vision focal deficits any difficulty swallowing or speaking.   Hospital Course:  1. Headache - with history of migraine. CT head is negative. Consulted neurology. Dr. Roseanne Reno on board while patient in house and he recommended the following:  Intractable headache with only slight improvement with parenteral analgesic medications and muscle relaxant, as well as Naprosyn and antianxiety agent. At this point I believe this patient has reached maximum medical improvement. Also believe that symptoms are at least in part embellished.  Recommending discontinuing parenteral medications. Further hospitalization is not likely to be of  significant benefit. She may continue analgesic medication as well as muscle relaxant, anti-inflammatory agent and antianxiety agent on an outpatient basis.  - I Agree with above statement and given hospital course agree that further hospitalization would not likely be of significant benefit.  Will discharge on naproxen (antiinflammatory), Flexeril (muscle relaxant), Lorazepam (antianxiety agent).  Will avoid opiods as currently not recommended by neurologist as indicated above.  2. History of WPW syndrome status post failed ablation as per patient - presently rate controlled.   3. Tobacco abuse - tobacco cessation counseling requested.   4. History of chronic pain and fibromyalgia. Most likely contributing to # 1.   5. Leukocytosis: suspect is from stress demarginalization, no fevers while in house.  Resolved without antibiotics.  Procedures:  None  Consultations:  Neurology: Dr. Roseanne Reno  Discharge Exam: Filed Vitals:   09/14/12 0540 09/14/12 1527 09/14/12 2200 09/15/12 0536  BP: 145/77 112/57 126/82 113/72  Pulse: 79 73 76 78  Temp: 97.7 F (36.5 C)  97.9 F (36.6 C) 97.8 F (36.6 C)  TempSrc: Oral  Oral Oral  Resp: 16 18 18 16   Height:      Weight:      SpO2: 98% 98% 96% 97%    General: Pt in NAD, Alert and Awake Cardiovascular: RRR, no MRG Respiratory: CTA BL, no wheezes  Discharge Instructions  Discharge Orders   Future Orders Complete By Expires     Call MD for:  extreme fatigue  As directed     Call MD for:  severe uncontrolled pain  As directed     Call MD for:  temperature >100.4  As directed     Diet - low sodium heart healthy  As directed     Discharge instructions  As directed     Comments:      Please be sure to follow up with your primary care physician so that you can get further assistance managing your chronic pain.  I would recommend a pain specialist given your history.    Increase activity slowly  As directed         Medication List          BENADRYL PO  Take 1 tablet by mouth every 6 (six) hours as needed (itching).     butalbital-acetaminophen-caffeine 50-325-40 MG per tablet  Commonly known as:  FIORICET, ESGIC  Take 1 tablet by mouth every 6 (six) hours as needed.     cyclobenzaprine 5 MG tablet  Commonly known as:  FLEXERIL  Take 1 tablet (5 mg total) by mouth 3 (three) times daily as needed (Migraine headaches).     LORazepam 1 MG tablet  Commonly known as:  ATIVAN  Take 1 tablet (1 mg total) by mouth 2 (two) times daily.     naproxen 500 MG tablet  Commonly known as:  NAPROSYN  Take 1 tablet (500 mg total) by mouth 3 (three) times daily with meals.     promethazine 12.5 MG tablet  Commonly known as:  PHENERGAN  Take 1 tablet (12.5 mg total) by mouth every 6 (six) hours as needed for nausea.       Allergies  Allergen Reactions  . Digoxin And Related Nausea And Vomiting    Elevated 'white count'  . Latex Hives and Swelling  . Toradol (Ketorolac Tromethamine) Other (See Comments)    Uncontrollable 'shakes'      The results of significant diagnostics from this hospitalization (including imaging, microbiology, ancillary and laboratory) are listed below for reference.    Significant Diagnostic Studies: Ct Head Wo Contrast  09/12/2012   *RADIOLOGY REPORT*  Clinical Data: Migraine headaches for 5 days.  History migraine headaches.  CT HEAD WITHOUT CONTRAST  Technique:  Contiguous axial images were obtained from the base of the skull through the vertex without contrast.  Comparison: None.  Findings: No acute to cortical infarct, hemorrhage, or mass lesion is present.  The ventricles are of normal size.  No significant extra-axial fluid collection is present.  Leftward nasal septal deviation is present.  The paranasal sinuses and mastoid air cells are clear.  IMPRESSION: Negative CT of the head.   Original Report Authenticated By: Marin Roberts, M.D.    Microbiology: No results found for this or any  previous visit (from the past 240 hour(s)).   Labs: Basic Metabolic Panel:  Recent Labs Lab 09/11/12 2348 09/12/12 0535  NA 136 136  K 4.0 4.0  CL 102 102  CO2 24 22  GLUCOSE 86 104*  BUN 4* 4*  CREATININE 0.67 0.61  CALCIUM 8.6 8.8   Liver Function Tests:  Recent Labs Lab 09/12/12 0535  AST 28  ALT 31  ALKPHOS 90  BILITOT 0.3  PROT 7.0  ALBUMIN 3.4*   No results found for this basename: LIPASE, AMYLASE,  in the last 168 hours No results found for this basename: AMMONIA,  in the last 168 hours CBC:  Recent Labs Lab 09/11/12 2348 09/12/12 0535 09/15/12 0505  WBC 9.0 11.1* 7.3  NEUTROABS 5.3  --   --   HGB 13.0 12.8 12.5  HCT 38.4 38.9 37.8  MCV 88.9 89.0 89.2  PLT 208 199 210   Cardiac Enzymes: No results found  for this basename: CKTOTAL, CKMB, CKMBINDEX, TROPONINI,  in the last 168 hours BNP: BNP (last 3 results) No results found for this basename: PROBNP,  in the last 8760 hours CBG: No results found for this basename: GLUCAP,  in the last 168 hours     Signed:  Penny Pia  [facility] 09/15/2012, 9:34 AM

## 2012-11-14 ENCOUNTER — Emergency Department (HOSPITAL_COMMUNITY)
Admission: EM | Admit: 2012-11-14 | Discharge: 2012-11-14 | Disposition: A | Discharge status: Home / Self Care | Payer: Medicaid Other | Attending: Emergency Medicine | Admitting: Emergency Medicine

## 2012-11-14 ENCOUNTER — Encounter (HOSPITAL_COMMUNITY): Payer: Self-pay | Admitting: Emergency Medicine

## 2012-11-14 DIAGNOSIS — F17200 Nicotine dependence, unspecified, uncomplicated: Secondary | ICD-10-CM | POA: Insufficient documentation

## 2012-11-14 DIAGNOSIS — G43909 Migraine, unspecified, not intractable, without status migrainosus: Secondary | ICD-10-CM | POA: Insufficient documentation

## 2012-11-14 DIAGNOSIS — Z91040 Latex allergy status: Secondary | ICD-10-CM | POA: Insufficient documentation

## 2012-11-14 DIAGNOSIS — Z8739 Personal history of other diseases of the musculoskeletal system and connective tissue: Secondary | ICD-10-CM | POA: Insufficient documentation

## 2012-11-14 DIAGNOSIS — Z8679 Personal history of other diseases of the circulatory system: Secondary | ICD-10-CM | POA: Insufficient documentation

## 2012-11-14 DIAGNOSIS — Z79899 Other long term (current) drug therapy: Secondary | ICD-10-CM | POA: Insufficient documentation

## 2012-11-14 MED ORDER — PROCHLORPERAZINE EDISYLATE 5 MG/ML IJ SOLN
10.0000 mg | Freq: Once | INTRAMUSCULAR | Status: AC
  Administered 2012-11-14: 10 mg via INTRAVENOUS
  Filled 2012-11-14: qty 2

## 2012-11-14 MED ORDER — DEXAMETHASONE SODIUM PHOSPHATE 10 MG/ML IJ SOLN
10.0000 mg | Freq: Once | INTRAMUSCULAR | Status: AC
  Administered 2012-11-14: 10 mg via INTRAVENOUS
  Filled 2012-11-14: qty 1

## 2012-11-14 MED ORDER — DIPHENHYDRAMINE HCL 50 MG/ML IJ SOLN
25.0000 mg | Freq: Once | INTRAMUSCULAR | Status: AC
  Administered 2012-11-14: 25 mg via INTRAVENOUS
  Filled 2012-11-14: qty 1

## 2012-11-14 NOTE — ED Notes (Signed)
Pt. reports migraine headache with nausea onset last night unrelieved by OTC Exedrin.

## 2012-11-14 NOTE — ED Provider Notes (Signed)
CSN: [id number]     Arrival date & time 11/14/12  0056 History   First MD Initiated Contact with Patient 11/14/12 0141     Chief Complaint  Patient presents with  . Migraine   (Consider location/radiation/quality/duration/timing/severity/associated sxs/prior Treatment) HPI Comments: She complains of a headache which began last night. Patient does report a history of migraines with similar features. Patient reports moderate to severe throbbing headache that has gradually worsened since it started. It is similar to previous migraines.  Patient is a 37 y.o. female presenting with migraines.  Migraine Associated symptoms include headaches.    Past Medical History  Diagnosis Date  . Fibromyalgia   . DDD (degenerative disc disease), cervical   . Migraine   . WPW (Wolff-Parkinson-White syndrome)    Past Surgical History  Procedure Laterality Date  . Knee surgery    . Cesarean section    . Tubal ligation     Family History  Problem Relation Age of Onset  . Migraines Mother   . CAD Father   . Migraines Daughter    History  Substance Use Topics  . Smoking status: Current Every Day Smoker  . Smokeless tobacco: Not on file  . Alcohol Use: No   OB History   Grav Para Term Preterm Abortions TAB SAB Ect Mult Living                 Review of Systems  Constitutional: Negative for fever.  Neurological: Positive for headaches.  All other systems reviewed and are negative.    Allergies  Digoxin and related; Latex; Other; and Toradol  Home Medications   Current Outpatient Rx  Name  Route  Sig  Dispense  Refill  . aspirin-acetaminophen-caffeine (EXCEDRIN MIGRAINE) 250-250-65 MG per tablet   Oral   Take 2 tablets by mouth every 6 (six) hours as needed for pain.         Marland Kitchen LORazepam (ATIVAN) 1 MG tablet   Oral   Take 1 tablet (1 mg total) by mouth 2 (two) times daily.   15 tablet   0   . promethazine (PHENERGAN) 12.5 MG tablet   Oral   Take 1 tablet (12.5 mg total) by  mouth every 6 (six) hours as needed for nausea.   30 tablet   0    BP 142/94  Pulse 87  Temp(Src) 98.7 F (37.1 C) (Oral)  Resp 14  SpO2 97%  LMP 10/05/2012 Physical Exam  Constitutional: She is oriented to person, place, and time. She appears well-developed and well-nourished. No distress.  HENT:  Head: Normocephalic and atraumatic.  Right Ear: Hearing normal.  Left Ear: Hearing normal.  Nose: Nose normal.  Mouth/Throat: Oropharynx is clear and moist and mucous membranes are normal.  Eyes: Conjunctivae and EOM are normal. Pupils are equal, round, and reactive to light.  Neck: Normal range of motion. Neck supple.  Cardiovascular: Regular rhythm, S1 normal and S2 normal.  Exam reveals no gallop and no friction rub.   No murmur heard. Pulmonary/Chest: Effort normal and breath sounds normal. No respiratory distress. She exhibits no tenderness.  Abdominal: Soft. Normal appearance and bowel sounds are normal. There is no hepatosplenomegaly. There is no tenderness. There is no rebound, no guarding, no tenderness at McBurney's point and negative Murphy's sign. No hernia.  Musculoskeletal: Normal range of motion.  Neurological: She is alert and oriented to person, place, and time. She has normal strength. No cranial nerve deficit or sensory deficit. Coordination normal. GCS eye subscore  is 4. GCS verbal subscore is 5. GCS motor subscore is 6.  Skin: Skin is warm, dry and intact. No rash noted. No cyanosis.  Psychiatric: She has a normal mood and affect. Her speech is normal and behavior is normal. Thought content normal.    ED Course  Procedures (including critical care time) Labs Review Labs Reviewed - No data to display Imaging Review No results found.  MDM  Diagnosis: Migraine  Patient presents to the ER for headache. She reports a history of recurrent migraine headaches. Patient states that this headache began last night. She reports a throbbing frontal headache with  photophobia, nausea but no vomiting. This is similar to previous migraines. No fever, neck pain or stiffness. Headache began gradually. She has taken Excedrin Migraine without improvement.  This headache is similar to previous headaches, does not require any imaging workup.  Gilda Crease, MD 11/14/12 [phone]

## 2012-12-16 ENCOUNTER — Encounter (HOSPITAL_COMMUNITY): Payer: Self-pay | Admitting: Emergency Medicine

## 2012-12-16 ENCOUNTER — Emergency Department (HOSPITAL_COMMUNITY)
Admission: EM | Admit: 2012-12-16 | Discharge: 2012-12-17 | Disposition: A | Discharge status: Home / Self Care | Payer: Medicaid Other | Attending: Emergency Medicine | Admitting: Emergency Medicine

## 2012-12-16 DIAGNOSIS — E669 Obesity, unspecified: Secondary | ICD-10-CM | POA: Insufficient documentation

## 2012-12-16 DIAGNOSIS — Z8679 Personal history of other diseases of the circulatory system: Secondary | ICD-10-CM | POA: Insufficient documentation

## 2012-12-16 DIAGNOSIS — G43909 Migraine, unspecified, not intractable, without status migrainosus: Secondary | ICD-10-CM | POA: Insufficient documentation

## 2012-12-16 DIAGNOSIS — Z91040 Latex allergy status: Secondary | ICD-10-CM | POA: Insufficient documentation

## 2012-12-16 DIAGNOSIS — F17200 Nicotine dependence, unspecified, uncomplicated: Secondary | ICD-10-CM | POA: Insufficient documentation

## 2012-12-16 DIAGNOSIS — M62830 Muscle spasm of back: Secondary | ICD-10-CM

## 2012-12-16 DIAGNOSIS — M5380 Other specified dorsopathies, site unspecified: Secondary | ICD-10-CM | POA: Insufficient documentation

## 2012-12-16 DIAGNOSIS — IMO0001 Reserved for inherently not codable concepts without codable children: Secondary | ICD-10-CM | POA: Insufficient documentation

## 2012-12-16 NOTE — ED Notes (Addendum)
C/o lower back pain since jumping at [employer] for her son's birthday last week.  Pt states she has fibromyalgia and degenerative disc disease.

## 2012-12-17 MED ORDER — HYDROMORPHONE HCL PF 1 MG/ML IJ SOLN
1.0000 mg | Freq: Once | INTRAMUSCULAR | Status: AC
  Administered 2012-12-17: 1 mg via INTRAVENOUS
  Filled 2012-12-17: qty 1

## 2012-12-17 MED ORDER — DIAZEPAM 5 MG/ML IJ SOLN
5.0000 mg | Freq: Once | INTRAMUSCULAR | Status: AC
  Administered 2012-12-17: 5 mg via INTRAVENOUS
  Filled 2012-12-17: qty 2

## 2012-12-17 MED ORDER — HYDROCODONE-ACETAMINOPHEN 5-325 MG PO TABS: 1.0000 | ORAL_TABLET | Freq: Four times a day (QID) | ORAL | Status: DC | PRN

## 2012-12-17 MED ORDER — METHOCARBAMOL 500 MG PO TABS: 500.0000 mg | ORAL_TABLET | Freq: Two times a day (BID) | ORAL | Status: DC

## 2012-12-17 NOTE — ED Notes (Signed)
NP made aware of pt pain and requesting more pain meds.

## 2012-12-17 NOTE — ED Provider Notes (Signed)
CSN: [id number]     Arrival date & time 12/16/12  2245 History   First MD Initiated Contact with Patient 12/16/12 2334     Chief Complaint  Patient presents with  . Back Pain   (Consider location/radiation/quality/duration/timing/severity/associated sxs/prior Treatment) HPI Comments: Patient states, a week ago.  She was jumping on trampoline.  Her back started hurting her.  Its been painful ever, since, low lumbar muscular skilled toe pain yesterday radiating to both arms, making them now.  This has resolved.  She, states she is taking over-the-counter Advil use hot compresses, with no relief of her pain.  She denies any numbness or tingling in her legs, loss of bowel or bladder control.  She is able to ambulate, but it is painful  Patient is a 37 y.o. female presenting with back pain. The history is provided by the patient.  Back Pain Location:  Lumbar spine Quality:  Aching Radiates to:  R shoulder and L shoulder (Yesterday pain radiated to her arms) Pain severity:  Severe Pain is:  Same all the time Onset quality:  Sudden Timing:  Constant Progression:  Worsening Chronicity:  Recurrent Context comment:  Jumping on a trampoline Relieved by:  Nothing Worsened by:  Movement Ineffective treatments:  NSAIDs Associated symptoms: no dysuria, no fever, no numbness and no weakness     Past Medical History  Diagnosis Date  . Fibromyalgia   . DDD (degenerative disc disease), cervical   . Migraine   . WPW (Wolff-Parkinson-White syndrome)    Past Surgical History  Procedure Laterality Date  . Knee surgery    . Cesarean section    . Tubal ligation     Family History  Problem Relation Age of Onset  . Migraines Mother   . CAD Father   . Migraines Daughter    History  Substance Use Topics  . Smoking status: Current Every Day Smoker  . Smokeless tobacco: Not on file  . Alcohol Use: No   OB History   Grav Para Term Preterm Abortions TAB SAB Ect Mult Living                  Review of Systems  Constitutional: Negative for fever.  Genitourinary: Negative for dysuria.  Musculoskeletal: Positive for back pain. Negative for neck stiffness.  Skin: Negative for rash.  Neurological: Negative for dizziness, weakness and numbness.  All other systems reviewed and are negative.    Allergies  Digoxin and related; Latex; Other; and Toradol  Home Medications   Current Outpatient Rx  Name  Route  Sig  Dispense  Refill  . aspirin-acetaminophen-caffeine (EXCEDRIN MIGRAINE) 250-250-65 MG per tablet   Oral   Take 2 tablets by mouth every 6 (six) hours as needed for pain.         Marland Kitchen ibuprofen (ADVIL,MOTRIN) 200 MG tablet   Oral   Take 200 mg by mouth every 6 (six) hours as needed for pain (pain).         Marland Kitchen HYDROcodone-acetaminophen (NORCO/VICODIN) 5-325 MG per tablet   Oral   Take 1 tablet by mouth every 6 (six) hours as needed for pain.   12 tablet   0   . methocarbamol (ROBAXIN) 500 MG tablet   Oral   Take 1 tablet (500 mg total) by mouth 2 (two) times daily.   20 tablet   0    BP 133/94  Pulse 96  Temp(Src) 98.3 F (36.8 C) (Oral)  Resp 18  Wt 184 lb 4.8 oz (  83.598 kg)  BMI 31.62 kg/m2  SpO2 99%  LMP 12/15/2012 Physical Exam  Nursing note and vitals reviewed. Constitutional: She is oriented to person, place, and time. She appears well-nourished.  Obese  HENT:  Head: Normocephalic.  Neck: Normal range of motion.  Cardiovascular: Normal rate and regular rhythm.   Pulmonary/Chest: Effort normal.  Musculoskeletal: She exhibits no edema and no tenderness.       Back:  Area of muscle spasm  Neurological: She is alert and oriented to person, place, and time.  Skin: No rash noted.    ED Course  Procedures (including critical care time) Labs Review Labs Reviewed - No data to display Imaging Review No results found.  EKG Interpretation   None       MDM   1. Muscle spasm of back     She has been given IM for IM, Dilaudid.  She  will be discharged home with Robaxin, and hydrocodone    Arman Filter, NP 12/17/12 [phone]

## 2012-12-18 NOTE — ED Provider Notes (Signed)
Medical screening examination/treatment/procedure(s) were performed by non-physician practitioner and as supervising physician I was immediately available for consultation/collaboration.  EKG Interpretation   None         Laray Anger, DO 12/18/12 0701

## 2013-02-12 ENCOUNTER — Emergency Department (HOSPITAL_COMMUNITY)
Admission: EM | Admit: 2013-02-12 | Discharge: 2013-02-12 | Disposition: A | Discharge status: Home / Self Care | Payer: Medicaid Other | Attending: Emergency Medicine | Admitting: Emergency Medicine

## 2013-02-12 ENCOUNTER — Encounter (HOSPITAL_COMMUNITY): Payer: Self-pay | Admitting: Emergency Medicine

## 2013-02-12 ENCOUNTER — Emergency Department (HOSPITAL_COMMUNITY): Payer: Medicaid Other

## 2013-02-12 DIAGNOSIS — I456 Pre-excitation syndrome: Secondary | ICD-10-CM | POA: Insufficient documentation

## 2013-02-12 DIAGNOSIS — Y9301 Activity, walking, marching and hiking: Secondary | ICD-10-CM | POA: Insufficient documentation

## 2013-02-12 DIAGNOSIS — M5030 Other cervical disc degeneration, unspecified cervical region: Secondary | ICD-10-CM | POA: Insufficient documentation

## 2013-02-12 DIAGNOSIS — S93401A Sprain of unspecified ligament of right ankle, initial encounter: Secondary | ICD-10-CM

## 2013-02-12 DIAGNOSIS — S93409A Sprain of unspecified ligament of unspecified ankle, initial encounter: Secondary | ICD-10-CM | POA: Insufficient documentation

## 2013-02-12 DIAGNOSIS — Z91040 Latex allergy status: Secondary | ICD-10-CM | POA: Insufficient documentation

## 2013-02-12 DIAGNOSIS — Y929 Unspecified place or not applicable: Secondary | ICD-10-CM | POA: Insufficient documentation

## 2013-02-12 DIAGNOSIS — Z888 Allergy status to other drugs, medicaments and biological substances status: Secondary | ICD-10-CM | POA: Insufficient documentation

## 2013-02-12 DIAGNOSIS — Z79899 Other long term (current) drug therapy: Secondary | ICD-10-CM | POA: Insufficient documentation

## 2013-02-12 DIAGNOSIS — F17200 Nicotine dependence, unspecified, uncomplicated: Secondary | ICD-10-CM | POA: Insufficient documentation

## 2013-02-12 DIAGNOSIS — IMO0001 Reserved for inherently not codable concepts without codable children: Secondary | ICD-10-CM | POA: Insufficient documentation

## 2013-02-12 DIAGNOSIS — W108XXA Fall (on) (from) other stairs and steps, initial encounter: Secondary | ICD-10-CM | POA: Insufficient documentation

## 2013-02-12 DIAGNOSIS — Z8669 Personal history of other diseases of the nervous system and sense organs: Secondary | ICD-10-CM | POA: Insufficient documentation

## 2013-02-12 MED ORDER — HYDROCODONE-ACETAMINOPHEN 5-325 MG PO TABS
1.0000 | ORAL_TABLET | Freq: Once | ORAL | Status: AC
  Administered 2013-02-12: 1 via ORAL
  Filled 2013-02-12: qty 1

## 2013-02-12 MED ORDER — HYDROCODONE-ACETAMINOPHEN 5-325 MG PO TABS: 1.0000 | ORAL_TABLET | Freq: Four times a day (QID) | ORAL | Status: DC | PRN

## 2013-02-12 MED ORDER — IBUPROFEN 800 MG PO TABS: 800.0000 mg | ORAL_TABLET | Freq: Three times a day (TID) | ORAL | Status: DC

## 2013-02-12 NOTE — Progress Notes (Signed)
Orthopedic Tech Progress Note Patient Details:  Brooke [name] Nov 22, 1975 [id number] ASO and crutches fit. Application tolerated well.  Ortho Devices Type of Ortho Device: ASO;Crutches Ortho Device/Splint Location: Right LE Ortho Device/Splint Interventions: Application   Brooke [name] 02/12/2013, 2:04 AM

## 2013-02-12 NOTE — ED Notes (Signed)
Pt. slipped and injured her right ankle yesterday afternoon , presents with pain /swelling at right ankle.

## 2013-02-12 NOTE — ED Provider Notes (Signed)
Medical screening examination/treatment/procedure(s) were performed by non-physician practitioner and as supervising physician I was immediately available for consultation/collaboration.  EKG Interpretation   None         Sharnette Kitamura M Ryann Leavitt, MD 02/12/13 0842 

## 2013-02-12 NOTE — ED Notes (Signed)
Ortho paged for ankle ASO and crutches.

## 2013-02-12 NOTE — ED Notes (Signed)
Pt given 1 vicodin.  Unable to scan due to computer malfunctioning during administration.

## 2013-02-12 NOTE — ED Provider Notes (Signed)
CSN: [id number]     Arrival date & time 02/12/13  0031 History   First MD Initiated Contact with Patient 02/12/13 0120     Chief Complaint  Patient presents with  . Ankle Pain   (Consider location/radiation/quality/duration/timing/severity/associated sxs/prior Treatment) HPI  37 year old female presents complaining of right ankle injury. Patient states she slipped and injured her R ankle yesterday afternoon.  Incident happened when patient was walking down a flight of stair and misstepped on the last step. Report acute onset of sharp achy pain to her right ankle. She was able to bear weight afterwards but now pain has been increasingly worse with increased swelling. Denies any significant pain to the foot or knee. Denies any other injury. Has taken ibuprofen with minimal relief. No abnormal bleeding.    Past Medical History  Diagnosis Date  . Fibromyalgia   . DDD (degenerative disc disease), cervical   . Migraine   . WPW (Wolff-Parkinson-White syndrome)    Past Surgical History  Procedure Laterality Date  . Knee surgery    . Cesarean section    . Tubal ligation     Family History  Problem Relation Age of Onset  . Migraines Mother   . CAD Father   . Migraines Daughter    History  Substance Use Topics  . Smoking status: Current Every Day Smoker  . Smokeless tobacco: Not on file  . Alcohol Use: No   OB History   Grav Para Term Preterm Abortions TAB SAB Ect Mult Living                 Review of Systems  Constitutional: Negative for fever.  Musculoskeletal: Positive for arthralgias and joint swelling.  Skin: Negative for rash and wound.  Neurological: Negative for numbness.    Allergies  Digoxin and related; Latex; Other; and Toradol  Home Medications   Current Outpatient Rx  Name  Route  Sig  Dispense  Refill  . aspirin-acetaminophen-caffeine (EXCEDRIN MIGRAINE) 250-250-65 MG per tablet   Oral   Take 2 tablets by mouth every 6 (six) hours as needed for pain.          Marland Kitchen HYDROcodone-acetaminophen (NORCO/VICODIN) 5-325 MG per tablet   Oral   Take 1 tablet by mouth every 6 (six) hours as needed for pain.   12 tablet   0   . ibuprofen (ADVIL,MOTRIN) 200 MG tablet   Oral   Take 200 mg by mouth every 6 (six) hours as needed for pain (pain).         . methocarbamol (ROBAXIN) 500 MG tablet   Oral   Take 1 tablet (500 mg total) by mouth 2 (two) times daily.   20 tablet   0    BP 144/86  Pulse 109  Temp(Src) 97.8 F (36.6 C) (Oral)  Resp 20  Wt 181 lb 1 oz (82.129 kg)  SpO2 99%  LMP 01/27/2013 Physical Exam  Nursing note and vitals reviewed. Constitutional: She appears well-developed and well-nourished. No distress.  HENT:  Head: Atraumatic.  Eyes: Conjunctivae are normal.  Neck: Neck supple.  Musculoskeletal: She exhibits tenderness (R ankle: significant tenderness to lateral malleolus with decreased ROM 2/2 pain.  pedal pulse intact, brisk cap refill.  no break in skin.  normal R foot and R knee.).  Neurological: She is alert.  Skin: No rash noted.  Psychiatric: She has a normal mood and affect.    ED Course  Procedures (including critical care time)  1:44 AM Pt with R  ankle injury.  Xray neg for acute fx/dislocation. Likely grade II sprain as suggested by moderate swelling.  Will provide ASO, crutches, RICE therapy, pain medication and ortho referral as needed.    Labs Review Labs Reviewed - No data to display Imaging Review Dg Ankle Complete Right  02/12/2013   CLINICAL DATA:  Rolled ankle, now with pain and swelling on lateral side.  EXAM: RIGHT ANKLE - COMPLETE 3+ VIEW  COMPARISON:  None.  FINDINGS: Diffuse soft tissue swelling is seen about the ankle, most prominent at the lateral malleolus. No acute fracture or dislocation. The ankle mortise remains approximated. Distal tibiofibular syndesmosis is intact. Talar dome is intact. Posterior and plantar calcaneal enthesophytes are noted. Osseous mineralization is normal. No  radiopaque foreign body.  IMPRESSION: Diffuse soft tissue swelling with no acute fracture or dislocation.   Electronically Signed   By: Rise Mu M.D.   On: 02/12/2013 01:12    EKG Interpretation   None       MDM   1. Right ankle sprain, initial encounter    BP 144/86  Pulse 109  Temp(Src) 97.8 F (36.6 C) (Oral)  Resp 20  Wt 181 lb 1 oz (82.129 kg)  SpO2 99%  LMP 01/27/2013  I have reviewed nursing notes and vital signs. I personally reviewed the imaging tests through PACS system  I reviewed available ER/hospitalization records thought the EMR     Fayrene Helper, [place] 02/12/13 0148

## 2013-02-12 NOTE — ED Notes (Signed)
Ortho at bedside.

## 2013-05-29 ENCOUNTER — Encounter (HOSPITAL_COMMUNITY): Payer: Self-pay | Admitting: Emergency Medicine

## 2013-05-29 ENCOUNTER — Emergency Department (HOSPITAL_COMMUNITY)
Admission: EM | Admit: 2013-05-29 | Discharge: 2013-05-29 | Disposition: A | Discharge status: Home / Self Care | Payer: Medicaid Other | Attending: Emergency Medicine | Admitting: Emergency Medicine

## 2013-05-29 ENCOUNTER — Emergency Department (HOSPITAL_COMMUNITY): Payer: Medicaid Other

## 2013-05-29 DIAGNOSIS — Z91040 Latex allergy status: Secondary | ICD-10-CM | POA: Insufficient documentation

## 2013-05-29 DIAGNOSIS — Z8739 Personal history of other diseases of the musculoskeletal system and connective tissue: Secondary | ICD-10-CM | POA: Insufficient documentation

## 2013-05-29 DIAGNOSIS — G8918 Other acute postprocedural pain: Secondary | ICD-10-CM | POA: Insufficient documentation

## 2013-05-29 DIAGNOSIS — Z9889 Other specified postprocedural states: Secondary | ICD-10-CM | POA: Insufficient documentation

## 2013-05-29 DIAGNOSIS — Z8679 Personal history of other diseases of the circulatory system: Secondary | ICD-10-CM | POA: Insufficient documentation

## 2013-05-29 DIAGNOSIS — K5900 Constipation, unspecified: Secondary | ICD-10-CM | POA: Insufficient documentation

## 2013-05-29 DIAGNOSIS — F17200 Nicotine dependence, unspecified, uncomplicated: Secondary | ICD-10-CM | POA: Insufficient documentation

## 2013-05-29 DIAGNOSIS — I456 Pre-excitation syndrome: Secondary | ICD-10-CM | POA: Insufficient documentation

## 2013-05-29 DIAGNOSIS — R109 Unspecified abdominal pain: Secondary | ICD-10-CM | POA: Insufficient documentation

## 2013-05-29 DIAGNOSIS — R110 Nausea: Secondary | ICD-10-CM | POA: Insufficient documentation

## 2013-05-29 LAB — CBC WITH DIFFERENTIAL/PLATELET
BASOS ABS: 0 10*3/uL (ref 0.0–0.1)
BASOS PCT: 0 % (ref 0–1)
EOS ABS: 0.1 10*3/uL (ref 0.0–0.7)
Eosinophils Relative: 1 % (ref 0–5)
HCT: 42 % (ref 36.0–46.0)
Hemoglobin: 13.8 g/dL (ref 12.0–15.0)
LYMPHS ABS: 4.1 10*3/uL — AB (ref 0.7–4.0)
Lymphocytes Relative: 34 % (ref 12–46)
MCH: 29.6 pg (ref 26.0–34.0)
MCHC: 32.9 g/dL (ref 30.0–36.0)
MCV: 90.1 fL (ref 78.0–100.0)
Monocytes Absolute: 0.5 10*3/uL (ref 0.1–1.0)
Monocytes Relative: 4 % (ref 3–12)
NEUTROS PCT: 61 % (ref 43–77)
Neutro Abs: 7.5 10*3/uL (ref 1.7–7.7)
PLATELETS: 206 10*3/uL (ref 150–400)
RBC: 4.66 MIL/uL (ref 3.87–5.11)
RDW: 12.9 % (ref 11.5–15.5)
WBC: 12.2 10*3/uL — ABNORMAL HIGH (ref 4.0–10.5)

## 2013-05-29 LAB — URINALYSIS, ROUTINE W REFLEX MICROSCOPIC
Bilirubin Urine: NEGATIVE
Glucose, UA: NEGATIVE mg/dL
KETONES UR: NEGATIVE mg/dL
Nitrite: NEGATIVE
PROTEIN: NEGATIVE mg/dL
Specific Gravity, Urine: 1.027 (ref 1.005–1.030)
UROBILINOGEN UA: 0.2 mg/dL (ref 0.0–1.0)
pH: 8 (ref 5.0–8.0)

## 2013-05-29 LAB — BASIC METABOLIC PANEL
BUN: 12 mg/dL (ref 6–23)
CALCIUM: 9.4 mg/dL (ref 8.4–10.5)
CO2: 23 mEq/L (ref 19–32)
Chloride: 100 mEq/L (ref 96–112)
Creatinine, Ser: 0.68 mg/dL (ref 0.50–1.10)
GLUCOSE: 91 mg/dL (ref 70–99)
POTASSIUM: 3.3 meq/L — AB (ref 3.7–5.3)
SODIUM: 140 meq/L (ref 137–147)

## 2013-05-29 LAB — URINE MICROSCOPIC-ADD ON

## 2013-05-29 LAB — I-STAT CG4 LACTIC ACID, ED: LACTIC ACID, VENOUS: 1.32 mmol/L (ref 0.5–2.2)

## 2013-05-29 MED ORDER — IOHEXOL 300 MG/ML  SOLN
50.0000 mL | Freq: Once | INTRAMUSCULAR | Status: AC | PRN
  Administered 2013-05-29: 50 mL via ORAL

## 2013-05-29 MED ORDER — SODIUM CHLORIDE 0.9 % IV BOLUS (SEPSIS)
1000.0000 mL | Freq: Once | INTRAVENOUS | Status: AC
  Administered 2013-05-29: 1000 mL via INTRAVENOUS

## 2013-05-29 MED ORDER — HYDROMORPHONE HCL PF 1 MG/ML IJ SOLN
1.0000 mg | Freq: Once | INTRAMUSCULAR | Status: AC
  Administered 2013-05-29: 1 mg via INTRAVENOUS
  Filled 2013-05-29: qty 1

## 2013-05-29 MED ORDER — IOHEXOL 300 MG/ML  SOLN
100.0000 mL | Freq: Once | INTRAMUSCULAR | Status: AC | PRN
  Administered 2013-05-29: 100 mL via INTRAVENOUS

## 2013-05-29 MED ORDER — PROMETHAZINE HCL 25 MG PO TABS: 25.0000 mg | ORAL_TABLET | Freq: Four times a day (QID) | ORAL | Status: DC | PRN

## 2013-05-29 MED ORDER — ONDANSETRON HCL 4 MG/2ML IJ SOLN
4.0000 mg | Freq: Once | INTRAMUSCULAR | Status: AC
  Administered 2013-05-29: 4 mg via INTRAVENOUS
  Filled 2013-05-29: qty 2

## 2013-05-29 NOTE — ED Notes (Signed)
Patient called out stating her IV had infiltrated. Patient left upper arm swollen. IV site D/C'd. Warm compress applied.

## 2013-05-29 NOTE — ED Notes (Signed)
Per EMS: Pt had surgery on Sunday at [facility] for herniated bowel.  Came home yesterday and took off the bandage today.  Pt states that she thinks her wound may be infected.  Incision is not red or draining.

## 2013-05-29 NOTE — ED Provider Notes (Signed)
CSN: [id number]     Arrival date & time 05/29/13  1704 History  This chart was scribed for non-physician practitioner Fayrene HelperBowie Safiatou Islam, PA-C working with Juliet RudeNathan R. Rubin PayorPickering, MD by Danella Maiersaroline Early, ED Scribe. This patient was seen in room WTR6/WTR6 and the patient's care was started at 5:54 PM.    Chief Complaint  Patient presents with  . Post-op Problem  . Wound Check   The history is provided by the patient. No language interpreter was used.   HPI Comments: Forrestine HimHolly Prell is a 38 y.o. female who presents to the Emergency Department complaining of a possible post-op infection. She had a laparoscopic herniorraphy 4 days ago by Dr Stana BuntingAnnameni at [facility]. She reports pus draining from the area and states she thinks it is infected. She was given Dilaudid 2mg  30 tablets and experienced relief for the first few days but is no longer getting relief. She reports nausea but no vomiting or diarrhea. She has not had a BM for 4-5 days. She has not passed gas. She has been urinating normally. She denies fever chills CP SOB. She called the surgeon who said to call her PCP but she does not have a PCP. She has a follow up appointment with the surgery next week.    Past Medical History  Diagnosis Date  . Fibromyalgia   . DDD (degenerative disc disease), cervical   . Migraine   . WPW (Wolff-Parkinson-White syndrome)    Past Surgical History  Procedure Laterality Date  . Knee surgery    . Cesarean section    . Tubal ligation     Family History  Problem Relation Age of Onset  . Migraines Mother   . CAD Father   . Migraines Daughter    History  Substance Use Topics  . Smoking status: Current Every Day Smoker  . Smokeless tobacco: Not on file  . Alcohol Use: No   OB History   Grav Para Term Preterm Abortions TAB SAB Ect Mult Living                 Review of Systems  Constitutional: Negative for fever and chills.  Respiratory: Negative for shortness of breath.   Cardiovascular: Negative for chest  pain.  Gastrointestinal: Positive for nausea, abdominal pain and constipation. Negative for vomiting and diarrhea.  Skin: Positive for wound.      Allergies  Digoxin and related; Latex; Other; and Toradol  Home Medications   Current Outpatient Rx  Name  Route  Sig  Dispense  Refill  . HYDROmorphone (DILAUDID) 2 MG tablet   Oral   Take 2 mg by mouth every 4 (four) hours as needed for severe pain.          BP 127/76  Pulse 80  Temp(Src) 98.6 F (37 C) (Oral)  Resp 16  SpO2 100% Physical Exam  Nursing note and vitals reviewed. Constitutional: She is oriented to person, place, and time. She appears well-developed and well-nourished. No distress.  HENT:  Head: Normocephalic and atraumatic.  Eyes: EOM are normal.  Neck: Neck supple. No tracheal deviation present.  Cardiovascular: Normal rate.   Pulmonary/Chest: Effort normal. No respiratory distress.  Abdominal: Soft. She exhibits no distension. There is tenderness (moderate throughout).  Abdomen with decreased bowel sounds. well healing surgical wound with no obvious pus drainage, no redness.  Musculoskeletal: Normal range of motion.  Neurological: She is alert and oriented to person, place, and time.  Skin: Skin is warm and dry.  Psychiatric: She  has a normal mood and affect. Her behavior is normal.    ED Course  Procedures (including critical care time) Medications  HYDROmorphone (DILAUDID) injection 1 mg (not administered)  sodium chloride 0.9 % bolus 1,000 mL (not administered)  ondansetron (ZOFRAN) injection 4 mg (not administered)   DIAGNOSTIC STUDIES: Oxygen Saturation is 100% on RA, normal by my interpretation.    COORDINATION OF CARE: 6:14 PM- Discussed with Dr Rubin Payor and pt will be moved to main side for eval including abdominal and pelvic CT to rule out infection or post-surgical complication. Pt agrees to plan.    Labs Review Labs Reviewed - No data to display Imaging Review No results  found.   EKG Interpretation None      MDM   Final diagnoses:  None    BP 127/76  Pulse 80  Temp(Src) 98.6 F (37 C) (Oral)  Resp 16  SpO2 100%  I personally performed the services described in this documentation, which was scribed in my presence. The recorded information has been reviewed and is accurate.    Fayrene Helper, PA-C 05/29/13 1820

## 2013-05-29 NOTE — Progress Notes (Signed)
   CARE MANAGEMENT ED NOTE 05/29/2013  Patient:  Brooke [name],Brooke [name]   Account Number:  [id number]  Date Initiated:  05/29/2013  Documentation initiated by:  Radford PaxFERRERO,Birdella Sippel  Subjective/Objective Assessment:   Patient presents to Ed with possible wound infection post herniated bowel surgery.     Subjective/Objective Assessment Detail:     Action/Plan:   Action/Plan Detail:   Anticipated DC Date:       Status Recommendation to Physician:   Result of Recommendation:    Other ED Services  Consult Working Plan    DC Planning Services  Other  PCP issues    Choice offered to / List presented to:            Status of service:  Completed, signed off  ED Comments:   ED Comments Detail:  Patient confirms her pcp is located at [facility] [place].  Patient has her first appointment Tuesday of next week but cannot remember the name of the physician.  System updated.

## 2013-05-29 NOTE — ED Notes (Signed)
Pt. Made aware for the need of urine. 

## 2013-05-29 NOTE — ED Provider Notes (Signed)
Medical screening examination/treatment/procedure(s) were conducted as a shared visit with non-physician practitioner(s) and myself.  I personally evaluated the patient during the encounter.   EKG Interpretation None     Patient with abdominal pain post hernia repair. Diffuse pain. No fevers. No purulent drainage per patient and family member, over they were worried that part of the dressing looks like it could be infection. The wound is overall well-appearing. CT scan done and showed just fluid collection, which may just be postoperative fluid collection. Discussed with Dr. Johna SheriffHoxworth, and he will help arrange followup in the urgent clinic in the next 2 days. Patient will call tomorrow  Juliet Rudeathan R. Rubin PayorPickering, MD 05/29/13 2342

## 2013-06-14 ENCOUNTER — Emergency Department (HOSPITAL_COMMUNITY): Payer: Medicaid Other

## 2013-06-14 ENCOUNTER — Emergency Department (HOSPITAL_COMMUNITY)
Admission: EM | Admit: 2013-06-14 | Discharge: 2013-06-14 | Disposition: A | Discharge status: Home / Self Care | Payer: Medicaid Other | Attending: Emergency Medicine | Admitting: Emergency Medicine

## 2013-06-14 ENCOUNTER — Encounter (HOSPITAL_COMMUNITY): Payer: Self-pay | Admitting: Emergency Medicine

## 2013-06-14 DIAGNOSIS — F17200 Nicotine dependence, unspecified, uncomplicated: Secondary | ICD-10-CM | POA: Insufficient documentation

## 2013-06-14 DIAGNOSIS — J159 Unspecified bacterial pneumonia: Secondary | ICD-10-CM | POA: Insufficient documentation

## 2013-06-14 DIAGNOSIS — Z91040 Latex allergy status: Secondary | ICD-10-CM | POA: Insufficient documentation

## 2013-06-14 DIAGNOSIS — IMO0002 Reserved for concepts with insufficient information to code with codable children: Secondary | ICD-10-CM

## 2013-06-14 DIAGNOSIS — M5030 Other cervical disc degeneration, unspecified cervical region: Secondary | ICD-10-CM | POA: Insufficient documentation

## 2013-06-14 DIAGNOSIS — J189 Pneumonia, unspecified organism: Secondary | ICD-10-CM

## 2013-06-14 DIAGNOSIS — Z3202 Encounter for pregnancy test, result negative: Secondary | ICD-10-CM | POA: Insufficient documentation

## 2013-06-14 DIAGNOSIS — Z79899 Other long term (current) drug therapy: Secondary | ICD-10-CM | POA: Insufficient documentation

## 2013-06-14 DIAGNOSIS — Z8679 Personal history of other diseases of the circulatory system: Secondary | ICD-10-CM | POA: Insufficient documentation

## 2013-06-14 DIAGNOSIS — Z8739 Personal history of other diseases of the musculoskeletal system and connective tissue: Secondary | ICD-10-CM | POA: Insufficient documentation

## 2013-06-14 DIAGNOSIS — Y849 Medical procedure, unspecified as the cause of abnormal reaction of the patient, or of later complication, without mention of misadventure at the time of the procedure: Secondary | ICD-10-CM | POA: Insufficient documentation

## 2013-06-14 LAB — COMPREHENSIVE METABOLIC PANEL
ALBUMIN: 3.3 g/dL — AB (ref 3.5–5.2)
ALK PHOS: 113 U/L (ref 39–117)
ALT: 16 U/L (ref 0–35)
AST: 14 U/L (ref 0–37)
BILIRUBIN TOTAL: 0.4 mg/dL (ref 0.3–1.2)
BUN: 6 mg/dL (ref 6–23)
CHLORIDE: 103 meq/L (ref 96–112)
CO2: 20 mEq/L (ref 19–32)
CREATININE: 0.65 mg/dL (ref 0.50–1.10)
Calcium: 8.9 mg/dL (ref 8.4–10.5)
GFR calc non Af Amer: >90 mL/min (ref >90)
GLUCOSE: 121 mg/dL — AB (ref 70–99)
POTASSIUM: 3.5 meq/L — AB (ref 3.7–5.3)
Sodium: 139 mEq/L (ref 137–147)
Total Protein: 7.5 g/dL (ref 6.0–8.3)

## 2013-06-14 LAB — CBC WITH DIFFERENTIAL/PLATELET
BASOS ABS: 0 10*3/uL (ref 0.0–0.1)
BASOS PCT: 0 % (ref 0–1)
EOS ABS: 0.2 10*3/uL (ref 0.0–0.7)
Eosinophils Relative: 2 % (ref 0–5)
HEMATOCRIT: 41.2 % (ref 36.0–46.0)
HEMOGLOBIN: 13.4 g/dL (ref 12.0–15.0)
LYMPHS ABS: 2.6 10*3/uL (ref 0.7–4.0)
LYMPHS PCT: 34 % (ref 12–46)
MCH: 29.5 pg (ref 26.0–34.0)
MCHC: 32.5 g/dL (ref 30.0–36.0)
MCV: 90.7 fL (ref 78.0–100.0)
MONOS PCT: 5 % (ref 3–12)
Monocytes Absolute: 0.4 10*3/uL (ref 0.1–1.0)
NEUTROS ABS: 4.5 10*3/uL (ref 1.7–7.7)
Neutrophils Relative %: 59 % (ref 43–77)
Platelets: 187 10*3/uL (ref 150–400)
RBC: 4.54 MIL/uL (ref 3.87–5.11)
RDW: 12.9 % (ref 11.5–15.5)
WBC: 7.7 10*3/uL (ref 4.0–10.5)

## 2013-06-14 LAB — URINALYSIS, ROUTINE W REFLEX MICROSCOPIC
Bilirubin Urine: NEGATIVE
Glucose, UA: NEGATIVE mg/dL
Hgb urine dipstick: NEGATIVE
Ketones, ur: NEGATIVE mg/dL
Nitrite: NEGATIVE
Protein, ur: NEGATIVE mg/dL
Specific Gravity, Urine: 1.012 (ref 1.005–1.030)
Urobilinogen, UA: 0.2 mg/dL (ref 0.0–1.0)
pH: 6 (ref 5.0–8.0)

## 2013-06-14 LAB — PREGNANCY, URINE: Preg Test, Ur: NEGATIVE

## 2013-06-14 LAB — URINE MICROSCOPIC-ADD ON

## 2013-06-14 LAB — I-STAT CG4 LACTIC ACID, ED: Lactic Acid, Venous: 2.09 mmol/L (ref 0.5–2.2)

## 2013-06-14 MED ORDER — SODIUM CHLORIDE 0.9 % IV BOLUS (SEPSIS)
1000.0000 mL | Freq: Once | INTRAVENOUS | Status: AC
  Administered 2013-06-14: 1000 mL via INTRAVENOUS

## 2013-06-14 MED ORDER — ONDANSETRON HCL 4 MG/2ML IJ SOLN
4.0000 mg | Freq: Once | INTRAMUSCULAR | Status: AC
  Administered 2013-06-14: 4 mg via INTRAVENOUS
  Filled 2013-06-14: qty 2

## 2013-06-14 MED ORDER — MORPHINE SULFATE 4 MG/ML IJ SOLN
4.0000 mg | Freq: Once | INTRAMUSCULAR | Status: AC
  Administered 2013-06-14: 4 mg via INTRAVENOUS
  Filled 2013-06-14: qty 1

## 2013-06-14 MED ORDER — PIPERACILLIN-TAZOBACTAM 3.375 G IVPB 30 MIN
3.3750 g | Freq: Once | INTRAVENOUS | Status: AC
  Administered 2013-06-14: 3.375 g via INTRAVENOUS
  Filled 2013-06-14: qty 50

## 2013-06-14 MED ORDER — HYDROMORPHONE HCL PF 1 MG/ML IJ SOLN
1.0000 mg | Freq: Once | INTRAMUSCULAR | Status: AC
  Administered 2013-06-14: 1 mg via INTRAVENOUS
  Filled 2013-06-14: qty 1

## 2013-06-14 MED ORDER — IOHEXOL 300 MG/ML  SOLN
80.0000 mL | Freq: Once | INTRAMUSCULAR | Status: AC | PRN
  Administered 2013-06-14: 80 mL via INTRAVENOUS

## 2013-06-14 MED ORDER — LEVOFLOXACIN 750 MG PO TABS: 750.0000 mg | ORAL_TABLET | Freq: Every day | ORAL | Status: DC

## 2013-06-14 MED ORDER — LEVOFLOXACIN IN D5W 750 MG/150ML IV SOLN: 750.0000 mg | Freq: Once | INTRAVENOUS | Status: DC

## 2013-06-14 MED ORDER — OXYCODONE-ACETAMINOPHEN 5-325 MG PO TABS: 2.0000 | ORAL_TABLET | ORAL | Status: DC | PRN

## 2013-06-14 MED ORDER — IOHEXOL 300 MG/ML  SOLN
25.0000 mL | INTRAMUSCULAR | Status: AC
  Administered 2013-06-14 (×2): 25 mL via ORAL

## 2013-06-14 NOTE — ED Notes (Signed)
Patient transferred with mask on.

## 2013-06-14 NOTE — ED Notes (Addendum)
General Surgery MD Janee Mornhompson at the bedside.

## 2013-06-14 NOTE — ED Notes (Signed)
Per EMS, Patient had a hernia repair about three weeks ago. This past week she started having a cough and congestion. Patient states, "I think I tore something down there." EMS stated they noticed drainage coming from the surgical sight. Patient is Alert and Oriented. Per EMS: 120/92, 94 HR, 100 % on RA.

## 2013-06-14 NOTE — ED Notes (Addendum)
Dr. Yao at the bedside. 

## 2013-06-14 NOTE — ED Notes (Signed)
Patient returned from CT

## 2013-06-14 NOTE — Consult Note (Signed)
Reason for Consult:Possible wound infection Referring Physician: [name] is an 38 y.o. female.  HPI: Patient underwent emergency umbilical hernia repair with mesh while she was out of town the Friday before Easter, April 3. She complains of a cough for about a week. She developed swelling in her surgical site. She came to the emergency room for evaluation of her cough and we are asked to evaluate her abdomen. She underwent CT scan abdomen and pelvis which raised the question of a surgical site abscess.  Past Medical History  Diagnosis Date  . Fibromyalgia   . DDD (degenerative disc disease), cervical   . Migraine   . WPW (Wolff-Parkinson-White syndrome)     Past Surgical History  Procedure Laterality Date  . Knee surgery    . Cesarean section    . Tubal ligation      Family History  Problem Relation Age of Onset  . Migraines Mother   . CAD Father   . Migraines Daughter     Social History:  reports that she has been smoking.  She does not have any smokeless tobacco history on file. She reports that she does not drink alcohol or use illicit drugs.  Allergies:  Allergies  Allergen Reactions  . Digoxin And Related Nausea And Vomiting    Elevated 'white count'  . Latex Hives and Swelling  . Other     Any nasal sprays cause serious migranes  . Toradol [Ketorolac Tromethamine] Other (See Comments)    Uncontrollable 'shakes'    Medications: Prior to Admission:  (Not in a hospital admission)  Results for orders placed during the hospital encounter of 06/14/13 (from the past 48 hour(s))  CBC WITH DIFFERENTIAL     Status: None   Collection Time    06/14/13 12:38 PM      Result Value Ref Range   WBC 7.7  4.0 - 10.5 K/uL   RBC 4.54  3.87 - 5.11 MIL/uL   Hemoglobin 13.4  12.0 - 15.0 g/dL   HCT 41.2  36.0 - 46.0 %   MCV 90.7  78.0 - 100.0 fL   MCH 29.5  26.0 - 34.0 pg   MCHC 32.5  30.0 - 36.0 g/dL   RDW 12.9  11.5 - 15.5 %   Platelets 187  150 - 400 K/uL    Neutrophils Relative % 59  43 - 77 %   Lymphocytes Relative 34  12 - 46 %   Monocytes Relative 5  3 - 12 %   Eosinophils Relative 2  0 - 5 %   Basophils Relative 0  0 - 1 %   Neutro Abs 4.5  1.7 - 7.7 K/uL   Lymphs Abs 2.6  0.7 - 4.0 K/uL   Monocytes Absolute 0.4  0.1 - 1.0 K/uL   Eosinophils Absolute 0.2  0.0 - 0.7 K/uL   Basophils Absolute 0.0  0.0 - 0.1 K/uL   WBC Morphology ATYPICAL LYMPHOCYTES    COMPREHENSIVE METABOLIC PANEL     Status: Abnormal   Collection Time    06/14/13 12:38 PM      Result Value Ref Range   Sodium 139  137 - 147 mEq/L   Potassium 3.5 (*) 3.7 - 5.3 mEq/L   Chloride 103  96 - 112 mEq/L   CO2 20  19 - 32 mEq/L   Glucose, Bld 121 (*) 70 - 99 mg/dL   BUN 6  6 - 23 mg/dL   Creatinine, Ser 0.65  0.50 - 1.10 mg/dL   Calcium 8.9  8.4 - 10.5 mg/dL   Total Protein 7.5  6.0 - 8.3 g/dL   Albumin 3.3 (*) 3.5 - 5.2 g/dL   AST 14  0 - 37 U/L   ALT 16  0 - 35 U/L   Alkaline Phosphatase 113  39 - 117 U/L   Total Bilirubin 0.4  0.3 - 1.2 mg/dL   GFR calc non Af Amer >90  >90 mL/min   GFR calc Af Amer >90  >90 mL/min   Comment: (NOTE)     The eGFR has been calculated using the CKD EPI equation.     This calculation has not been validated in all clinical situations.     eGFR's persistently <90 mL/min signify possible Chronic Kidney     Disease.  I-STAT CG4 LACTIC ACID, ED     Status: None   Collection Time    06/14/13 12:51 PM      Result Value Ref Range   Lactic Acid, Venous 2.09  0.5 - 2.2 mmol/L  URINALYSIS, ROUTINE W REFLEX MICROSCOPIC     Status: Abnormal   Collection Time    06/14/13  2:47 PM      Result Value Ref Range   Color, Urine YELLOW  YELLOW   APPearance CLEAR  CLEAR   Specific Gravity, Urine 1.012  1.005 - 1.030   pH 6.0  5.0 - 8.0   Glucose, UA NEGATIVE  NEGATIVE mg/dL   Hgb urine dipstick NEGATIVE  NEGATIVE   Bilirubin Urine NEGATIVE  NEGATIVE   Ketones, ur NEGATIVE  NEGATIVE mg/dL   Protein, ur NEGATIVE  NEGATIVE mg/dL   Urobilinogen,  UA 0.2  0.0 - 1.0 mg/dL   Nitrite NEGATIVE  NEGATIVE   Leukocytes, UA TRACE (*) NEGATIVE  URINE MICROSCOPIC-ADD ON     Status: Abnormal   Collection Time    06/14/13  2:47 PM      Result Value Ref Range   Squamous Epithelial / LPF FEW (*) RARE   WBC, UA 0-2  <3 WBC/hpf   Bacteria, UA FEW (*) RARE   Urine-Other MUCOUS PRESENT    PREGNANCY, URINE     Status: None   Collection Time    06/14/13  2:48 PM      Result Value Ref Range   Preg Test, Ur NEGATIVE  NEGATIVE   Comment:            THE SENSITIVITY OF THIS     METHODOLOGY IS >20 mIU/mL.    Dg Chest 2 View  06/14/2013   CLINICAL DATA:  HERNIA  EXAM: CHEST  2 VIEW  COMPARISON:  None.  FINDINGS: The heart size and mediastinal contours are within normal limits. Both lungs are clear. The visualized skeletal structures are unremarkable.  IMPRESSION: No active cardiopulmonary disease.   Electronically Signed   By: Margaree Mackintosh M.D.   On: 06/14/2013 13:28   Ct Abdomen Pelvis W Contrast  06/14/2013   CLINICAL DATA:  Umbilical hernia repair three weeks ago, evaluate for periumbilical abscess  EXAM: CT ABDOMEN AND PELVIS WITH CONTRAST  TECHNIQUE: Multidetector CT imaging of the abdomen and pelvis was performed using the standard protocol following bolus administration of intravenous contrast.  CONTRAST:  65m OMNIPAQUE IOHEXOL 300 MG/ML  SOLN  COMPARISON:  05/29/2013  FINDINGS: Mild tree-in-bud nodularity in the right middle lobe (series 4/image 3), suspicious for infectious bronchiolitis.  3.0 cm subcapsular hemangioma in the anterior segment right hepatic lobe (series 2/  image 26).  Spleen, pancreas, and adrenal glands are within normal limits.  Status post cholecystectomy. No intrahepatic or extrahepatic ductal dilatation. Common duct measures 8 mm.  Kidneys are within normal limits.  No hydronephrosis.  No evidence of bowel obstruction.  Normal appendix.  No evidence of abdominal aortic aneurysm.  No abdominopelvic ascites.  No suspicious  abdominopelvic lymphadenopathy.  Uterus and bilateral ovaries are unremarkable.  Bladder is within normal limits.  4.5 x 6.9 x 5.4 cm periumbilical fluid collection (series 2/image 52), with associated thickened enhancing rim and inflammatory changes in the surrounding subcutaneous fat (series 2/image 50), compatible with postoperative abscess. This now reflects a more well were gain as collection when compared to the prior study.  Stable mild inflammatory/postsurgical changes within the mesenteric fat beneath the midline anterior abdominal wall (series 2/image 56).  Mild degenerative changes at L3-4.  IMPRESSION: 6.9 cm postoperative abscess in the periumbilical region, as above.  Mild tree-in-bud nodularity in the right middle lobe, incompletely visualized, suspicious for infectious bronchiolitis.   Electronically Signed   By: Julian Hy M.D.   On: 06/14/2013 16:18    Review of Systems  Constitutional: Positive for fever and chills.  HENT: Negative.   Respiratory: Positive for cough.   Cardiovascular: Negative.   Gastrointestinal: Negative for nausea and vomiting.  Genitourinary: Negative.   Musculoskeletal: Negative.   Skin: Negative.   Neurological: Negative.   Endo/Heme/Allergies: Negative.   Psychiatric/Behavioral: Negative.    Blood pressure 103/63, pulse 68, temperature 98.6 F (37 C), resp. rate 18, last menstrual period 05/26/2013, SpO2 99.00%. Physical Exam  Constitutional: She appears well-developed and well-nourished.  HENT:  Head: Normocephalic.  Neck: No tracheal deviation present.  Cardiovascular: Normal rate and normal heart sounds.   Respiratory: Breath sounds normal. No stridor. No respiratory distress.  GI:  Supra-and local incision with small central opening, fullness above this, area was probed and a large amount of serous fluid was evacuated. I placed quarter inch iodoform packing and a bulky gauze dressing.    Assessment/Plan: Seroma status post umbilical  hernia repair without evidence of infection. Leave packing in and change gauze dressing daily. This will drain a lot. I sent a culture in the emergency room. Please have the patient call our office at 9 AM Monday, [phone], for an appointment in our urgent clinic that afternoon. Oral antibiotics as planned for pulmonary issues will be fine.  Zenovia Jarred 06/14/2013, 6:48 PM

## 2013-06-14 NOTE — ED Notes (Signed)
Pt returned from X-ray.  

## 2013-06-14 NOTE — Discharge Instructions (Signed)
Take levaquin for a week.   Take percocet as prescribed for pain. Do NOT drive with it.   Follow up with surgery clinic next week.   Return to ER if you have fever, severe pain, purulent drainage from wound.

## 2013-06-14 NOTE — ED Notes (Signed)
Patient placed on Droplet Precautions.

## 2013-06-14 NOTE — ED Provider Notes (Signed)
CSN: [id number]     Arrival date & time 06/14/13  1204 History   First MD Initiated Contact with Patient 06/14/13 1212     Chief Complaint  Patient presents with  . Hernia     (Consider location/radiation/quality/duration/timing/severity/associated sxs/prior Treatment) The history is provided by the patient.  Brooke [name] is a 38 y.o. female hx of fibromyalgia, WPW, recent hernia repair here with purulent drainage from the surgical site. She had umbilical hernia repair done at [facility] before Easter earlier this month. Came in on 4/8 for swelling around the area and CT showed small fluid collection and surgery was consulted and thought it was likely normal postop changes. She has been coughing for several days. Has some subjective chills. She also notes that there is more purulent discharge from the wound. Hasn't seen a [facility] in town yet.    Past Medical History  Diagnosis Date  . Fibromyalgia   . DDD (degenerative disc disease), cervical   . Migraine   . WPW (Wolff-Parkinson-White syndrome)    Past Surgical History  Procedure Laterality Date  . Knee surgery    . Cesarean section    . Tubal ligation     Family History  Problem Relation Age of Onset  . Migraines Mother   . CAD Father   . Migraines Daughter    History  Substance Use Topics  . Smoking status: Current Every Day Smoker  . Smokeless tobacco: Not on file  . Alcohol Use: No   OB History   Grav Para Term Preterm Abortions TAB SAB Ect Mult Living                 Review of Systems  Respiratory: Positive for cough.   Skin: Positive for wound.  All other systems reviewed and are negative.     Allergies  Digoxin and related; Latex; Other; and Toradol  Home Medications   Prior to Admission medications   Medication Sig Start Date End Date Taking? Authorizing Provider  guaiFENesin (ROBITUSSIN) 100 MG/5ML SOLN Take 200 mg by mouth every 4 (four) hours as needed for cough or to loosen phlegm.   Yes  Historical Provider, MD  hydrOXYzine (ATARAX/VISTARIL) 25 MG tablet Take 25 mg by mouth at bedtime.   Yes Historical Provider, MD   BP 114/84  Pulse 99  Temp(Src) 98.6 F (37 C)  Resp 16  SpO2 97%  LMP 05/26/2013 Physical Exam  Nursing note and vitals reviewed. Constitutional: She is oriented to person, place, and time.  Uncomfortable   HENT:  Head: Normocephalic.  Mouth/Throat: Oropharynx is clear and moist.  Eyes: Conjunctivae are normal. Pupils are equal, round, and reactive to light.  Neck: Normal range of motion. Neck supple.  Cardiovascular: Normal rate, regular rhythm and normal heart sounds.   Pulmonary/Chest: Effort normal and breath sounds normal. No respiratory distress. She has no wheezes. She has no rales.  Abdominal: Soft. Bowel sounds are normal.  Umbilical hernia site with erythema. There is minimal purulent discharge from the wound site. + fluctuance.   Musculoskeletal: Normal range of motion. She exhibits no edema.  Neurological: She is alert and oriented to person, place, and time. No cranial nerve deficit. Coordination normal.  Skin: Skin is warm and dry.  Psychiatric: She has a normal mood and affect. Her behavior is normal. Judgment and thought content normal.    ED Course  Procedures (including critical care time)  Angiocath insertion Performed by: Richardean Canalavid H Roe Wilner  Consent: Verbal consent obtained. Risks  and benefits: risks, benefits and alternatives were discussed Time out: Immediately prior to procedure a "time out" was called to verify the correct patient, procedure, equipment, support staff and site/side marked as required.  Preparation: Patient was prepped and draped in the usual sterile fashion.  Vein Location: R EJ  Ultrasound Guided  Gauge: 20   Normal blood return and flush without difficulty Patient tolerance: Patient tolerated the procedure well with no immediate complications.    Labs Review Labs Reviewed  COMPREHENSIVE METABOLIC  PANEL - Abnormal; Notable for the following:    Potassium 3.5 (*)    Glucose, Bld 121 (*)    Albumin 3.3 (*)    All other components within normal limits  URINALYSIS, ROUTINE W REFLEX MICROSCOPIC - Abnormal; Notable for the following:    Leukocytes, UA TRACE (*)    All other components within normal limits  URINE MICROSCOPIC-ADD ON - Abnormal; Notable for the following:    Squamous Epithelial / LPF FEW (*)    Bacteria, UA FEW (*)    All other components within normal limits  CBC WITH DIFFERENTIAL  PREGNANCY, URINE  I-STAT CG4 LACTIC ACID, ED    Imaging Review Dg Chest 2 View  06/14/2013   CLINICAL DATA:  HERNIA  EXAM: CHEST  2 VIEW  COMPARISON:  None.  FINDINGS: The heart size and mediastinal contours are within normal limits. Both lungs are clear. The visualized skeletal structures are unremarkable.  IMPRESSION: No active cardiopulmonary disease.   Electronically Signed   By: Salome Holmes M.D.   On: 06/14/2013 13:28   Ct Abdomen Pelvis W Contrast  06/14/2013   CLINICAL DATA:  Umbilical hernia repair three weeks ago, evaluate for periumbilical abscess  EXAM: CT ABDOMEN AND PELVIS WITH CONTRAST  TECHNIQUE: Multidetector CT imaging of the abdomen and pelvis was performed using the standard protocol following bolus administration of intravenous contrast.  CONTRAST:  80mL OMNIPAQUE IOHEXOL 300 MG/ML  SOLN  COMPARISON:  05/29/2013  FINDINGS: Mild tree-in-bud nodularity in the right middle lobe (series 4/image 3), suspicious for infectious bronchiolitis.  3.0 cm subcapsular hemangioma in the anterior segment right hepatic lobe (series 2/ image 26).  Spleen, pancreas, and adrenal glands are within normal limits.  Status post cholecystectomy. No intrahepatic or extrahepatic ductal dilatation. Common duct measures 8 mm.  Kidneys are within normal limits.  No hydronephrosis.  No evidence of bowel obstruction.  Normal appendix.  No evidence of abdominal aortic aneurysm.  No abdominopelvic ascites.  No  suspicious abdominopelvic lymphadenopathy.  Uterus and bilateral ovaries are unremarkable.  Bladder is within normal limits.  4.5 x 6.9 x 5.4 cm periumbilical fluid collection (series 2/image 52), with associated thickened enhancing rim and inflammatory changes in the surrounding subcutaneous fat (series 2/image 50), compatible with postoperative abscess. This now reflects a more well were gain as collection when compared to the prior study.  Stable mild inflammatory/postsurgical changes within the mesenteric fat beneath the midline anterior abdominal wall (series 2/image 56).  Mild degenerative changes at L3-4.  IMPRESSION: 6.9 cm postoperative abscess in the periumbilical region, as above.  Mild tree-in-bud nodularity in the right middle lobe, incompletely visualized, suspicious for infectious bronchiolitis.   Electronically Signed   By: Charline Bills M.D.   On: 06/14/2013 16:18     EKG Interpretation None      MDM   Final diagnoses:  None    Brooke [name] is a 38 y.o. female here with possible abscess around surgical site. Will repeat CT, do sepsis  workup.   4:41 PM WBC 7.7. Increased abscess on CT. I called surgery for eval. Also possible pneumonia on xray, given zosyn.   6:49 PM Surgery at bedside. Drained the abscess. He feels that its likely seroma and doesn't require abx. She will f/u with surgery clinic next week. Given that she has pneumonia, will give levaquin.     Richardean Canalavid H [place]ath Madding, MD 06/14/13 [phone]

## 2013-06-14 NOTE — ED Notes (Addendum)
Dr. Silverio LayYao at the bedside placing an US IV in the right External Jugular. Patient verbalized understanding and consent before IV was placed.

## 2013-06-14 NOTE — ED Notes (Signed)
Dr. Yao at the bedside. 

## 2013-06-17 ENCOUNTER — Encounter (INDEPENDENT_AMBULATORY_CARE_PROVIDER_SITE_OTHER): Payer: Self-pay | Admitting: General Surgery

## 2013-06-17 ENCOUNTER — Ambulatory Visit (INDEPENDENT_AMBULATORY_CARE_PROVIDER_SITE_OTHER): Payer: Medicaid Other | Admitting: General Surgery

## 2013-06-17 VITALS — BP 114/72 | HR 71 | Temp 97.4°F | Resp 18 | Ht 64.0 in | Wt 175.6 lb

## 2013-06-17 DIAGNOSIS — T8140XA Infection following a procedure, unspecified, initial encounter: Secondary | ICD-10-CM

## 2013-06-17 DIAGNOSIS — T8149XA Infection following a procedure, other surgical site, initial encounter: Secondary | ICD-10-CM

## 2013-06-17 LAB — WOUND CULTURE: Special Requests: NORMAL

## 2013-06-17 MED ORDER — OXYCODONE-ACETAMINOPHEN 5-325 MG PO TABS: 1.0000 | ORAL_TABLET | ORAL | Status: DC | PRN

## 2013-06-17 NOTE — Progress Notes (Signed)
Chief complaint: Wound swelling and drainage  History: Patient underwent emergency umbilical hernia repair without mesh at [place] 4 weeks ago. She developed increasing swelling at her incision recently that was aggravated by pneumonia and coughing as well. She began having drainage from the wound and presented to the emergency department 3 days ago. Her wound was opened and packed with gauze and she was started on oral antibiotics and arranged followup in our office. She is having a fair amount of pain at the incision site. There is less swelling. The original bandages in place.  Past Medical History  Diagnosis Date  . Fibromyalgia   . DDD (degenerative disc disease), cervical   . Migraine   . WPW (Wolff-Parkinson-White syndrome)    Past Surgical History  Procedure Laterality Date  . Knee surgery    . Cesarean section    . Tubal ligation     Current Outpatient Prescriptions  Medication Sig Dispense Refill  . guaiFENesin (ROBITUSSIN) 100 MG/5ML SOLN Take 200 mg by mouth every 4 (four) hours as needed for cough or to loosen phlegm.      . hydrOXYzine (ATARAX/VISTARIL) 25 MG tablet Take 25 mg by mouth at bedtime.      Marland Kitchen. levofloxacin (LEVAQUIN) 750 MG tablet Take 1 tablet (750 mg total) by mouth daily. X 7 days  7 tablet  0  . oxyCODONE-acetaminophen (PERCOCET) 5-325 MG per tablet Take 1-2 tablets by mouth every 4 (four) hours as needed.  30 tablet  0   No current facility-administered medications for this visit.   Allergies  Allergen Reactions  . Digoxin And Related Nausea And Vomiting    Elevated 'white count'  . Latex Hives and Swelling  . Other     Any nasal sprays cause serious migranes  . Toradol [Ketorolac Tromethamine] Other (See Comments)    Uncontrollable 'shakes'   Exam: BP 114/72  Pulse 71  Temp(Src) 97.4 F (36.3 C) (Temporal)  Resp 18  Ht 5\' 4"  (1.626 m)  Wt 175 lb 9.6 oz (79.652 kg)  BMI 30.13 kg/m2  LMP 05/26/2013 General: Moderately obese  Caucasian female no distress Abdomen: Generally soft and nontender. There is a 4-5 cm umbilical incision that is packed open in the center. Some purulent drainage. No erythema or significant swelling. The packing is removed. I cleaned the wound with a cotton-tipped applicator and repacked with iodoform gauze.  Assessment and plan: Postoperative seroma/wound infection and umbilical hernia repair. No mesh used by patient history. She will remove the packing in 2 days and then change gauze dressings daily. Continue antibiotics until they're gone. I refilled her prescription for Percocet. She will call for any increasing redness or drainage or other concerns otherwise I will see her back in about 10 days.

## 2013-06-18 ENCOUNTER — Telehealth (HOSPITAL_BASED_OUTPATIENT_CLINIC_OR_DEPARTMENT_OTHER): Payer: Self-pay | Admitting: Emergency Medicine

## 2013-06-18 NOTE — Progress Notes (Signed)
ED Antimicrobial Stewardship Positive Culture Follow Up   Brooke [name] is an 38 y.o. female who [facility] with a chief complaint of  Chief Complaint  Patient presents with  . Hernia    Recent Results (from the past 720 hour(s))  WOUND CULTURE     Status: None   Collection Time    06/14/13  6:51 PM      Result Value Ref Range Status   Specimen Description WOUND UMBILICUS   Final   Special Requests Normal   Final   Gram Stain     Final   Value: FEW WBC PRESENT, PREDOMINANTLY PMN     NO SQUAMOUS EPITHELIAL CELLS SEEN     RARE GRAM POSITIVE COCCI IN PAIRS     [facility]formed at [facility]   Culture     Final   Value: MODERATE STAPHYLOCOCCUS AUREUS     Note: RIFAMPIN AND GENTAMICIN SHOULD NOT BE USED AS SINGLE DRUGS FOR TREATMENT OF STAPH INFECTIONS.     Performed at [facility]   Report Status 06/17/2013 FINAL   Final   Organism ID, Bacteria STAPHYLOCOCCUS AUREUS   Final    [x]  Treated with Levaquin, organism intermediate to prescribed antimicrobial  New antibiotic prescription: continue Levaquin for pulmonary issues, and add Keflex 500mg , take 1 capsule four times daily for 10 days  Flow Manager instructed to fax[facility]  ED Provider: Emilia BeckKaitlyn Szekalski PA-C  Shelba FlakeNathan E. Achilles Dunkope, PharmD Clinical Pharmacist - Resident Pager: [phone] Pharmacy: [phone] 06/18/2013 1:39 PM

## 2013-06-18 NOTE — Telephone Encounter (Signed)
Post ED Visit - Positive Culture Follow-up: Successful Patient Follow-Up  Culture assessed and recommendations reviewed by: []  Brooke [name], Pharm.D., BCPS []  Brooke [name], [address] BCPS []  Brooke [name], [address] BCPS []  Brooke [name], [address] BCPS, AAHIVP []  Brooke [name], Pharm.D., BCPS, AAHIVP [x]  Brooke [name], Pharm.D.  Positive wound culture  []  Patient discharged without antimicrobial prescription and treatment is now indicated [x]  Organism is intermediate to prescribed ED discharge antimicrobial []  Patient with positive blood cultures  Changes discussed with ED provider: Emilia BeckKaitlyn Szekalski PA-C New antibiotic prescription: Continue Levaquin and add Keflex 500 mg QUD x 10 days    [facility] Macklyn [name] 06/18/2013, 12:45 PM

## 2013-06-19 ENCOUNTER — Telehealth (HOSPITAL_BASED_OUTPATIENT_CLINIC_OR_DEPARTMENT_OTHER): Payer: Self-pay

## 2013-06-19 NOTE — Telephone Encounter (Signed)
Pt informed of Dx and need for addl tx.  Rx called in and left on VM @ [facility]  [phone].  Cx result faxed to [facility] [phone] Attn Dr B. Janee Mornhompson

## 2013-06-20 ENCOUNTER — Encounter (INDEPENDENT_AMBULATORY_CARE_PROVIDER_SITE_OTHER): Payer: Self-pay | Admitting: Surgery

## 2013-06-20 ENCOUNTER — Ambulatory Visit (INDEPENDENT_AMBULATORY_CARE_PROVIDER_SITE_OTHER): Payer: Medicaid Other | Admitting: Surgery

## 2013-06-20 VITALS — BP 128/88 | HR 77 | Temp 97.8°F | Resp 16 | Ht 64.0 in | Wt 176.8 lb

## 2013-06-20 DIAGNOSIS — T8140XA Infection following a procedure, unspecified, initial encounter: Secondary | ICD-10-CM

## 2013-06-20 DIAGNOSIS — T8149XA Infection following a procedure, other surgical site, initial encounter: Secondary | ICD-10-CM

## 2013-06-20 MED ORDER — TRAMADOL HCL 50 MG PO TABS: 50.0000 mg | ORAL_TABLET | Freq: Two times a day (BID) | ORAL | Status: DC | PRN

## 2013-06-20 MED ORDER — FLUCONAZOLE 100 MG PO TABS: 100.0000 mg | ORAL_TABLET | Freq: Every day | ORAL | Status: DC

## 2013-06-20 NOTE — Progress Notes (Signed)
Chief complaint: Wound swelling and drainage  History: Patient underwent emergency umbilical hernia repair without mesh [facility] [place] ago. She developed increasing swelling at her incision recently that was aggravated by pneumonia and coughing as well. She began having drainage from the wound and presented to the emergency department 6 days ago. Her wound was opened and packed with gauze and she was started on oral antibiotics and arranged followup in our office. She is having a fair amount of pain at the incision site. There is less swelling. She saw Dr Johna SheriffHoxworth 3 days ago.  She wanted her wound checked due to pain and drainage. No fever.   Past Medical History  Diagnosis Date  . Fibromyalgia   . DDD (degenerative disc disease), cervical   . Migraine   . WPW (Wolff-Parkinson-White syndrome)    Past Surgical History  Procedure Laterality Date  . Knee surgery    . Cesarean section    . Tubal ligation     Current Outpatient Prescriptions  Medication Sig Dispense Refill  . guaiFENesin (ROBITUSSIN) 100 MG/5ML SOLN Take 200 mg by mouth every 4 (four) hours as needed for cough or to loosen phlegm.      . hydrOXYzine (ATARAX/VISTARIL) 25 MG tablet Take 25 mg by mouth at bedtime.      Marland Kitchen. levofloxacin (LEVAQUIN) 750 MG tablet Take 1 tablet (750 mg total) by mouth daily. X 7 days  7 tablet  0  . oxyCODONE-acetaminophen (PERCOCET) 5-325 MG per tablet Take 1-2 tablets by mouth every 4 (four) hours as needed.  30 tablet  0  . fluconazole (DIFLUCAN) 100 MG tablet Take 1 tablet (100 mg total) by mouth daily. Take one daily for 5 days  5 tablet  0  . traMADol (ULTRAM) 50 MG tablet Take 1 tablet (50 mg total) by mouth 2 (two) times daily as needed.  20 tablet  0   No current facility-administered medications for this visit.   Allergies  Allergen Reactions  . Digoxin And Related Nausea And Vomiting    Elevated 'white count'  . Latex Hives and Swelling  . Other     Any nasal sprays  cause serious migranes  . Toradol [Ketorolac Tromethamine] Other (See Comments)    Uncontrollable 'shakes'   Exam: BP 128/88  Pulse 77  Temp(Src) 97.8 F (36.6 C)  Resp 16  Ht 5\' 4"  (1.626 m)  Wt 176 lb 12.8 oz (80.196 kg)  BMI 30.33 kg/m2  LMP 05/26/2013 General: Moderately obese Caucasian female no distress Abdomen: Generally soft and nontender. There is a 4-5 cm umbilical incision that is  open in the center. no purulent drainage. No erythema or significant swelling.   Assessment and plan: Postoperative seroma/wound infection and umbilical hernia repair. No mesh used by patient history.   change gauze dressings daily. Continue antibiotics until they're gone.  She will call for any increasing redness or drainage or other concerns otherwise  Dr Johna SheriffHoxworth see her back in about   7 days.

## 2013-06-20 NOTE — Patient Instructions (Signed)
KEEP AREA DRY AND COVER WITH GAUZE. SHOWER AND CLEAN WITH DIAL SOAP. RETURN NEXT WEEK TO SEE DR HOXWORTH.

## 2013-06-26 ENCOUNTER — Telehealth (INDEPENDENT_AMBULATORY_CARE_PROVIDER_SITE_OTHER): Payer: Self-pay | Admitting: General Surgery

## 2013-06-26 DIAGNOSIS — T8149XA Infection following a procedure, other surgical site, initial encounter: Secondary | ICD-10-CM

## 2013-06-26 NOTE — Telephone Encounter (Signed)
Norco OK but I am not in office until Fri and someone will have to write it.  Would use abdominal binder to try to minimize or eliminate tape

## 2013-06-26 NOTE — Telephone Encounter (Signed)
Patient called in wanting a refill on pain medication. Denies N/V, fever, chills.  Pain is at level 8 on 0-10 scale.  It is still a stabbing, burning, and constant pain.  Asked the patient if she would be okay trying to reduce the medication from percocet to [facility].  She explained this would be fine.  She also wanted the physician to know that she is still having to bandage the wound, and she is now having an allergic reaction to the tape.  She has broken into a rash around the tape.  She informed me that she has switched to paper tape and it still does not seem to be helping. Informed her that I would send this message to the physician and as soon as we receive a response, we would let her know.

## 2013-06-27 ENCOUNTER — Other Ambulatory Visit (INDEPENDENT_AMBULATORY_CARE_PROVIDER_SITE_OTHER): Payer: Self-pay | Admitting: General Surgery

## 2013-06-27 DIAGNOSIS — T8149XA Infection following a procedure, other surgical site, initial encounter: Secondary | ICD-10-CM

## 2013-06-27 MED ORDER — HYDROCODONE-ACETAMINOPHEN 5-325 MG PO TABS: 1.0000 | ORAL_TABLET | Freq: Four times a day (QID) | ORAL | Status: DC | PRN

## 2013-06-27 NOTE — Telephone Encounter (Signed)
Informed patient that her Rx will be ready for pickup today after 3:00 at our front desk.  Also informed her that she could use an abdominal binder to help reduce the rash.  She verbalized understanding of both these things.

## 2013-06-27 NOTE — Addendum Note (Signed)
Addended by: Ignacia MarvelMOFFITT, KENDALL on: 06/27/2013 04:04 PM   Modules accepted: Orders, Medications

## 2013-06-27 NOTE — Addendum Note (Signed)
Addended by: Ethlyn GallerySPILLERS, Latronda Spink on: 06/27/2013 03:59 PM   Modules accepted: Orders, Medications

## 2013-07-09 ENCOUNTER — Encounter (INDEPENDENT_AMBULATORY_CARE_PROVIDER_SITE_OTHER): Payer: Medicaid Other | Admitting: General Surgery

## 2013-07-18 ENCOUNTER — Encounter (INDEPENDENT_AMBULATORY_CARE_PROVIDER_SITE_OTHER): Payer: Self-pay | Admitting: General Surgery

## 2013-08-26 ENCOUNTER — Encounter (HOSPITAL_COMMUNITY): Payer: Self-pay | Admitting: Emergency Medicine

## 2013-08-26 ENCOUNTER — Emergency Department (HOSPITAL_COMMUNITY): Payer: Medicaid Other

## 2013-08-26 ENCOUNTER — Emergency Department (HOSPITAL_COMMUNITY)
Admission: EM | Admit: 2013-08-26 | Discharge: 2013-08-26 | Disposition: A | Discharge status: Home / Self Care | Payer: Medicaid Other | Attending: Emergency Medicine | Admitting: Emergency Medicine

## 2013-08-26 DIAGNOSIS — Z91040 Latex allergy status: Secondary | ICD-10-CM | POA: Insufficient documentation

## 2013-08-26 DIAGNOSIS — R519 Headache, unspecified: Secondary | ICD-10-CM

## 2013-08-26 DIAGNOSIS — Z79899 Other long term (current) drug therapy: Secondary | ICD-10-CM | POA: Insufficient documentation

## 2013-08-26 DIAGNOSIS — M5030 Other cervical disc degeneration, unspecified cervical region: Secondary | ICD-10-CM | POA: Diagnosis not present

## 2013-08-26 DIAGNOSIS — Z8679 Personal history of other diseases of the circulatory system: Secondary | ICD-10-CM | POA: Diagnosis not present

## 2013-08-26 DIAGNOSIS — M5412 Radiculopathy, cervical region: Secondary | ICD-10-CM

## 2013-08-26 DIAGNOSIS — F17200 Nicotine dependence, unspecified, uncomplicated: Secondary | ICD-10-CM | POA: Diagnosis not present

## 2013-08-26 DIAGNOSIS — M79602 Pain in left arm: Secondary | ICD-10-CM

## 2013-08-26 DIAGNOSIS — R079 Chest pain, unspecified: Secondary | ICD-10-CM | POA: Diagnosis present

## 2013-08-26 DIAGNOSIS — G8929 Other chronic pain: Secondary | ICD-10-CM | POA: Insufficient documentation

## 2013-08-26 DIAGNOSIS — R51 Headache: Secondary | ICD-10-CM

## 2013-08-26 DIAGNOSIS — G43909 Migraine, unspecified, not intractable, without status migrainosus: Secondary | ICD-10-CM | POA: Insufficient documentation

## 2013-08-26 DIAGNOSIS — M542 Cervicalgia: Secondary | ICD-10-CM

## 2013-08-26 LAB — BASIC METABOLIC PANEL
Anion gap: 16 — ABNORMAL HIGH (ref 5–15)
BUN: 7 mg/dL (ref 6–23)
CO2: 20 mEq/L (ref 19–32)
Calcium: 9.1 mg/dL (ref 8.4–10.5)
Chloride: 103 mEq/L (ref 96–112)
Creatinine, Ser: 0.62 mg/dL (ref 0.50–1.10)
GFR calc Af Amer: >90 mL/min (ref >90)
GFR calc non Af Amer: >90 mL/min (ref >90)
Glucose, Bld: 94 mg/dL (ref 70–99)
Potassium: 4.3 mEq/L (ref 3.7–5.3)
Sodium: 139 mEq/L (ref 137–147)

## 2013-08-26 LAB — CBC
HCT: 41.1 % (ref 36.0–46.0)
Hemoglobin: 13.5 g/dL (ref 12.0–15.0)
MCH: 29.3 pg (ref 26.0–34.0)
MCHC: 32.8 g/dL (ref 30.0–36.0)
MCV: 89.3 fL (ref 78.0–100.0)
Platelets: 193 10*3/uL (ref 150–400)
RBC: 4.6 MIL/uL (ref 3.87–5.11)
RDW: 14 % (ref 11.5–15.5)
WBC: 8 10*3/uL (ref 4.0–10.5)

## 2013-08-26 LAB — I-STAT TROPONIN, ED: Troponin i, poc: 0 ng/mL (ref 0.00–0.08)

## 2013-08-26 MED ORDER — DIAZEPAM 5 MG PO TABS: 5.0000 mg | ORAL_TABLET | Freq: Two times a day (BID) | ORAL | Status: DC

## 2013-08-26 MED ORDER — DIPHENHYDRAMINE HCL 25 MG PO CAPS
25.0000 mg | ORAL_CAPSULE | Freq: Once | ORAL | Status: AC
  Administered 2013-08-26: 25 mg via ORAL
  Filled 2013-08-26: qty 1

## 2013-08-26 MED ORDER — DIAZEPAM 5 MG PO TABS
5.0000 mg | ORAL_TABLET | Freq: Once | ORAL | Status: AC
  Administered 2013-08-26: 5 mg via ORAL
  Filled 2013-08-26: qty 1

## 2013-08-26 MED ORDER — HYDROMORPHONE HCL PF 1 MG/ML IJ SOLN
1.0000 mg | Freq: Once | INTRAMUSCULAR | Status: AC
  Administered 2013-08-26: 1 mg via INTRAVENOUS
  Filled 2013-08-26: qty 1

## 2013-08-26 MED ORDER — METOCLOPRAMIDE HCL 5 MG/ML IJ SOLN
10.0000 mg | Freq: Once | INTRAMUSCULAR | Status: AC
  Administered 2013-08-26: 10 mg via INTRAVENOUS
  Filled 2013-08-26: qty 2

## 2013-08-26 MED ORDER — SODIUM CHLORIDE 0.9 % IV BOLUS (SEPSIS)
1000.0000 mL | Freq: Once | INTRAVENOUS | Status: AC
  Administered 2013-08-26: 1000 mL via INTRAVENOUS

## 2013-08-26 MED ORDER — OXYCODONE-ACETAMINOPHEN 5-325 MG PO TABS
2.0000 | ORAL_TABLET | Freq: Once | ORAL | Status: AC
  Administered 2013-08-26: 2 via ORAL
  Filled 2013-08-26: qty 2

## 2013-08-26 MED ORDER — TRAMADOL HCL 50 MG PO TABS: 50.0000 mg | ORAL_TABLET | Freq: Four times a day (QID) | ORAL | Status: DC | PRN

## 2013-08-26 MED ORDER — ASPIRIN 81 MG PO CHEW
324.0000 mg | CHEWABLE_TABLET | Freq: Once | ORAL | Status: AC
  Administered 2013-08-26: 324 mg via ORAL
  Filled 2013-08-26 (×2): qty 4

## 2013-08-26 NOTE — Discharge Instructions (Signed)
Cervical Sprain A cervical sprain is when the tissues (ligaments) that hold the neck bones in place stretch or tear. HOME CARE   Put ice on the injured area.  Put ice in a plastic bag.  Place a towel between your skin and the bag.  Leave the ice on for 15-20 minutes, 3-4 times a day.  You may have been given a collar to wear. This collar keeps your neck from moving while you heal.  Do not take the collar off unless told by your doctor.  If you have long hair, keep it outside of the collar.  Ask your doctor before changing the position of your collar. You may need to change its position over time to make it more comfortable.  If you are allowed to take off the collar for cleaning or bathing, follow your doctor's instructions on how to do it safely.  Keep your collar clean by wiping it with mild soap and water. Dry it completely. If the collar has removable pads, remove them every 1-2 days to hand wash them with soap and water. Allow them to air dry. They should be dry before you wear them in the collar.  Do not drive while wearing the collar.  Only take medicine as told by your doctor.  Keep all doctor visits as told.  Keep all physical therapy visits as told.  Adjust your work station so that you have good posture while you work.  Avoid positions and activities that make your problems worse.  Warm up and stretch before being active. GET HELP IF:  Your pain is not controlled with medicine.  You cannot take less pain medicine over time as planned.  Your activity level does not improve as expected. GET HELP RIGHT AWAY IF:   You are bleeding.  Your stomach is upset.  You have an allergic reaction to your medicine.  You develop new problems that you cannot explain.  You lose feeling (become numb) or you cannot move any part of your body (paralysis).  You have tingling or weakness in any part of your body.  Your symptoms get worse. Symptoms include:  Pain,  soreness, stiffness, puffiness (swelling), or a burning feeling in your neck.  Pain when your neck is touched.  Shoulder or upper back pain.  Limited ability to move your neck.  Headache.  Dizziness.  Your hands or arms feel week, lose feeling, or tingle.  Muscle spasms.  Difficulty swallowing or chewing. MAKE SURE YOU:   Understand these instructions.  Will watch your condition.  Will get help right away if you are not doing well or get worse. Document Released: 07/27/2007 Document Revised: 10/10/2012 Document Reviewed: 08/15/2012 [facility] Patient Information 2015 [place], [place]. This information is not intended to replace advice given to you by your health care provider. Make sure you discuss any questions you have with your health care provider.  Cervical Radiculopathy Cervical radiculopathy means a nerve in the neck is pinched or bruised. This can cause pain or loss of feeling (numbness) that runs from your neck to your arm and fingers. HOME CARE   Put ice on the injured or painful area.  Put ice in a plastic bag.  Place a towel between your skin and the bag.  Leave the ice on for 15-20 minutes, 03-04 times a day, or as told by your doctor.  If ice does not help, you can try using heat. Take a warm shower or bath, or use a hot water bottle as  told by your doctor.  You may try a gentle neck and shoulder massage.  Use a flat pillow when you sleep.  Only take medicines as told by your doctor.  Keep all physical therapy visits as told by your doctor.  If you are given a soft collar, wear it as told by your doctor. GET HELP RIGHT AWAY IF:   Your pain gets worse and is not controlled with medicine.  You lose feeling or feel weak in your hand, arm, face, or leg.  You have a fever or stiff neck.  You cannot control when you poop or pee (incontinence).  You have trouble with walking, balance, or speaking. MAKE SURE YOU:   Understand these  instructions.  Will watch your condition.  Will get help right away if you are not doing well or get worse. Document Released: 01/27/2011 Document Revised: 05/02/2011 Document Reviewed: 01/27/2011 [facility] Patient Information 2015 [place], [place]. This information is not intended to replace advice given to you by your health care provider. Make sure you discuss any questions you have with your health care provider.

## 2013-08-26 NOTE — ED Notes (Signed)
Phlebotomist unable to get blood.

## 2013-08-26 NOTE — ED Provider Notes (Signed)
  Medical screening examination/treatment/procedure(s) were conducted as a shared visit with non-physician practitioner(s) or resident and myself. I personally evaluated the patient during the encounter and agree with the findings and plan unless otherwise indicated.  I have personally reviewed any xrays and/ or EKG's with the provider and I agree with interpretation.  Patient with history of degenerative cervical disc disease, migraine, fibromyalgia presents with left arm pain and left chest pain and 8:00 this morning. Pain started after helping lift her mother and mild radiation down left arm similar to previous cervical disc disease. Patient denies weakness urinary bladder changes. Patient has diffuse body pain similar to her fibromyalgia. No cardiac history and patient denies cardiac risk factors except for smoking. On exam patient has paraspinal cervical tenderness worse with range of motion no significant midline tenderness. 5 post right bilateral upper extremities with abduction of shoulders, flexion of elbows, extension of wrist and abduction of fingers. Neurovascularly intact with strong pulses distally bilateral. Lungs clear heart regular rate and rhythm. Clinically patient has worsening pain since lifting her mother likely musculoskeletal however with atypical chest pain is nonexertional and no diaphoresis or nausea plan for screening troponin and EKG with outpatient followup. Patient has migraine gradual onset similar previous plan for pain medicines.  Emergency Ultrasound Study:  Angiocath insertion Performed by: Enid SkeensZAVITZ, Lisandra Mathisen M  Consent: Verbal consent obtained. Risks and benefits: risks, benefits and alternatives were discussed Immediately prior to procedure the correct patient, procedure, equipment, support staff and site/side marked as needed.  Indication: difficult IV access  Preparation: Patient was prepped and draped in the usual sterile fashion.  Vein Location: right basilic vein  was visualized during assessment for potential access sites and was found to be patent/ easily compressed with linear ultrasound. The needle was visualized with real-time ultrasound and guided into the vein.  Gauge: 18 g  Image saved and stored.  Normal blood return. Patient tolerance: Patient tolerated the procedure well with no immediate complications. Diffuse body pain/fibromyalgia, atypical chest pain, degenerative disc disease, neck pain         Enid SkeensJoshua M Aleisha Paone, MD 08/26/13 [phone]

## 2013-08-26 NOTE — ED Notes (Signed)
Pt. Was watching tv and developed lt. Sided chest pain, non-radiating.  Pt. Also is beginning to have Migraine, (Hx of Migraines.  )  Pt. Has pain with movement and palpation.  Skin is p/w/d.  Denies any sob or nausea

## 2013-08-26 NOTE — ED Provider Notes (Signed)
CSN: [id number]     Arrival date & time 08/26/13  0927 History   First MD Initiated Contact with Patient 08/26/13 0930     Chief Complaint  Patient presents with  . Chest Pain     (Consider location/radiation/quality/duration/timing/severity/associated sxs/prior Treatment) HPI Pt is a 38yo female with hx of fibromyalgia, DDD in cervical spine, migraines and WPW presenting to ED c/o sudden onset left sided chest pain, sharp in nature, radiating down left arm.  Pain is 9/10. Pt states she was lying down watching television when it started, however, report lifting her mother off the ground from a fall around 7:30/8AM this morning.  Pt states she is unsure if she "pulled a muscle or something."  Pt reports going to pain management for chronic neck pain and scheduled for facet joint injections for first time on 7/11.  Pt also c/o gradual onset worsening diffuse headache, consistent with her previous migraines.  Pain is constant, aching, moderate in severity at this time.  Denies fever, n/v/d. Denies cough, congestion, SOB.  Reports taking her daily medications for chronic pain including flexeril and norco w/o relief.    Past Medical History  Diagnosis Date  . Fibromyalgia   . DDD (degenerative disc disease), cervical   . Migraine   . WPW (Wolff-Parkinson-White syndrome)    Past Surgical History  Procedure Laterality Date  . Knee surgery    . Cesarean section    . Tubal ligation     Family History  Problem Relation Age of Onset  . Migraines Mother   . CAD Father   . Migraines Daughter    History  Substance Use Topics  . Smoking status: Current Every Day Smoker  . Smokeless tobacco: Not on file  . Alcohol Use: No   OB History   Grav Para Term Preterm Abortions TAB SAB Ect Mult Living                 Review of Systems  Constitutional: Negative for fever and chills.  Respiratory: Negative for cough and shortness of breath.   Cardiovascular: Positive for chest pain. Negative for  palpitations and leg swelling.  Gastrointestinal: Negative for nausea, vomiting, abdominal pain and diarrhea.  Musculoskeletal: Positive for myalgias and neck pain. Negative for back pain and neck stiffness.  Neurological: Positive for numbness ( "tingling") and headaches. Negative for dizziness, syncope, weakness and light-headedness.  All other systems reviewed and are negative.     Allergies  Digoxin and related; Latex; Other; and Toradol  Home Medications   Prior to Admission medications   Medication Sig Start Date End Date Taking? Authorizing Provider  aspirin EC 325 MG tablet Take 325 mg by mouth once as needed (for chest pain).   Yes Historical Provider, MD  butalbital-acetaminophen-caffeine (FIORICET, ESGIC) 50-325-40 MG per tablet Take 1 tablet by mouth 2 (two) times daily as needed for headache.   Yes Historical Provider, MD  cyclobenzaprine (FLEXERIL) 10 MG tablet Take 10 mg by mouth 3 (three) times daily as needed for muscle spasms.   Yes Historical Provider, MD  HYDROcodone-acetaminophen (NORCO/VICODIN) 5-325 MG per tablet Take 1 tablet by mouth every 6 (six) hours as needed for moderate pain.   Yes Historical Provider, MD  diazepam (VALIUM) 5 MG tablet Take 1 tablet (5 mg total) by mouth 2 (two) times daily. 08/26/13   Junius Finner, PA-C  traMADol (ULTRAM) 50 MG tablet Take 1 tablet (50 mg total) by mouth every 6 (six) hours as needed. 08/26/13  Junius FinnerErin O'Malley, PA-C   BP 100/61  Pulse 76  Temp(Src) 97.7 F (36.5 C) (Oral)  Resp 18  Ht 5\' 5"  (1.651 m)  Wt 173 lb (78.472 kg)  BMI 28.79 kg/m2  SpO2 99%  LMP 07/27/2013 Physical Exam  Nursing note and vitals reviewed. Constitutional: She is oriented to person, place, and time. She appears well-developed and well-nourished.  Lying in exam bed, tearful, appears uncomfortable.  HENT:  Head: Normocephalic and atraumatic.  Eyes: Conjunctivae are normal. No scleral icterus.  Neck: Normal range of motion.  Cardiovascular:  Normal rate, regular rhythm and normal heart sounds.   Pulmonary/Chest: Effort normal and breath sounds normal. No respiratory distress. She has no wheezes. She has no rales. She exhibits no tenderness.  Abdominal: Soft. Bowel sounds are normal. She exhibits no distension and no mass. There is no tenderness. There is no rebound and no guarding.  Musculoskeletal: Normal range of motion. She exhibits tenderness. She exhibits no edema.  FROM all extremities. 5/5 grip strength bilaterally.   Neurological: She is alert and oriented to person, place, and time. She has normal strength. No cranial nerve deficit or sensory deficit. She displays a negative Romberg sign. GCS eye subscore is 4. GCS verbal subscore is 5. GCS motor subscore is 6.  Reflex Scores:      Tricep reflexes are 2+ on the left side.      Bicep reflexes are 2+ on the left side.      Patellar reflexes are 2+ on the left side. Sensation to light and sharp touch in tact bilaterally.  Skin: Skin is warm and dry.    ED Course  Procedures (including critical care time) Labs Review Labs Reviewed  BASIC METABOLIC PANEL - Abnormal; Notable for the following:    Anion gap 16 (*)    All other components within normal limits  CBC  I-STAT TROPOININ, ED    Imaging Review Dg Chest 2 View  08/26/2013   CLINICAL DATA:  Chest pain.  EXAM: CHEST  2 VIEW  COMPARISON:  PA and lateral chest 06/14/2013.  FINDINGS: Lungs are clear. Heart size is normal. No pneumothorax or pleural effusion. Cholecystectomy clips are noted.  IMPRESSION: No acute disease.   Electronically Signed   By: Drusilla Kannerhomas  Dalessio M.D.   On: 08/26/2013 11:17     EKG Interpretation None      MDM   Final diagnoses:  Neck pain  Headache, unspecified headache type  Left arm pain  Cervical radiculopathy     Pt is a 38yo female with hx of chronic neck pain presenting to ED c/o left sided chest pain and neck pain radiating into left arm, as well as associated headache that was  gradual in onset. Feels similar to previous headaches.  Pt does report picking up her mother from the floor earlier this morning prior to onset of symptoms.  Chest pain is atypical for ACS.  Low risk for major cardiac event via HEART score. Pain sounds more radicular in nature.  On exa,/ 5/5 grip strength bilaterally with sensation in tact. Pain improved with pain medications, however required multiple doses of pain medication while in ED. Cardiac workup: EKG, troponin, CXR: unremarkable.  CBC and BMP: unremarkable. Will discharge home to f/u as scheduled with pain management and PCP. Advised pt to avoid heavy lifting or sudden movements with neck or arms.  Home care instructions provided for musculoskeletal pain. Return precautions provided. Pt verbalized understanding and agreement with tx plan.  Junius FinnerErin O'Malley, PA-C 08/26/13 [id number]

## 2013-11-27 ENCOUNTER — Emergency Department (HOSPITAL_COMMUNITY)
Admission: EM | Admit: 2013-11-27 | Discharge: 2013-11-28 | Disposition: A | Discharge status: Home / Self Care | Payer: Medicaid Other | Attending: Emergency Medicine | Admitting: Emergency Medicine

## 2013-11-27 ENCOUNTER — Encounter (HOSPITAL_COMMUNITY): Payer: Self-pay | Admitting: Emergency Medicine

## 2013-11-27 DIAGNOSIS — Z91040 Latex allergy status: Secondary | ICD-10-CM | POA: Diagnosis not present

## 2013-11-27 DIAGNOSIS — G43909 Migraine, unspecified, not intractable, without status migrainosus: Secondary | ICD-10-CM | POA: Insufficient documentation

## 2013-11-27 DIAGNOSIS — Y9289 Other specified places as the place of occurrence of the external cause: Secondary | ICD-10-CM | POA: Diagnosis not present

## 2013-11-27 DIAGNOSIS — M797 Fibromyalgia: Secondary | ICD-10-CM | POA: Diagnosis not present

## 2013-11-27 DIAGNOSIS — M5030 Other cervical disc degeneration, unspecified cervical region: Secondary | ICD-10-CM | POA: Diagnosis not present

## 2013-11-27 DIAGNOSIS — T63441A Toxic effect of venom of bees, accidental (unintentional), initial encounter: Secondary | ICD-10-CM | POA: Diagnosis not present

## 2013-11-27 DIAGNOSIS — Y9389 Activity, other specified: Secondary | ICD-10-CM | POA: Diagnosis not present

## 2013-11-27 DIAGNOSIS — Z720 Tobacco use: Secondary | ICD-10-CM | POA: Diagnosis not present

## 2013-11-27 DIAGNOSIS — Z79899 Other long term (current) drug therapy: Secondary | ICD-10-CM | POA: Diagnosis not present

## 2013-11-27 NOTE — ED Notes (Signed)
Pt reports she got bit by red wasp Sunday. Since then, area has becoming increasingly larger and more red. Pt c/o of itching to area. She has been taking benadryl at home

## 2013-11-27 NOTE — ED Provider Notes (Signed)
CSN: [id number]     Arrival date & time 11/27/13  2035 History  This chart was scribed for non-physician practitioner, Ladona Mow, PA-C working with Toy Cookey, MD by Greggory Stallion, ED scribe. This patient was seen in room TR06C/TR06C and the patient's care was started at 11:43 PM.   Chief Complaint  Patient presents with  . Insect Bite   The history is provided by the patient. No language interpreter was used.   HPI Comments: Brooke [name] is a 38 y.o. female who presents to the Emergency Department complaining of a red wasp sting to her right thigh that occurred 3 days ago. States the area has gotten increasingly red and swollen. Reports significant itching around the area. Pt has taken benadryl with no relief. Denies fever, nausea, emesis, hoarseness, shortness of breath, dizziness, weakness, allergy to bees.  Past Medical History  Diagnosis Date  . Fibromyalgia   . DDD (degenerative disc disease), cervical   . Migraine   . WPW (Wolff-Parkinson-White syndrome)    Past Surgical History  Procedure Laterality Date  . Knee surgery    . Cesarean section    . Tubal ligation     Family History  Problem Relation Age of Onset  . Migraines Mother   . CAD Father   . Migraines Daughter    History  Substance Use Topics  . Smoking status: Current Every Day Smoker  . Smokeless tobacco: Not on file  . Alcohol Use: No   OB History   Grav Para Term Preterm Abortions TAB SAB Ect Mult Living                 Review of Systems  Constitutional: Negative for fever.  HENT: Negative for trouble swallowing and voice change.   Respiratory: Negative for choking, chest tightness and shortness of breath.   Gastrointestinal: Negative for nausea and vomiting.  Skin: Positive for color change.  All other systems reviewed and are negative.  Allergies  Digoxin and related; Latex; Other; and Toradol  Home Medications   Prior to Admission medications   Medication Sig Start Date End Date  Taking? Authorizing Provider  butalbital-acetaminophen-caffeine (FIORICET, ESGIC) 50-325-40 MG per tablet Take 1 tablet by mouth 2 (two) times daily as needed for headache.   Yes Historical Provider, MD  cyclobenzaprine (FLEXERIL) 10 MG tablet Take 10 mg by mouth 2 (two) times daily.   Yes Historical Provider, MD  diphenhydrAMINE (BENADRYL) 25 mg capsule Take 25 mg by mouth every 6 (six) hours as needed for itching.   Yes Historical Provider, MD  HYDROcodone-acetaminophen (NORCO/VICODIN) 5-325 MG per tablet Take 1 tablet by mouth every 6 (six) hours as needed for moderate pain.   Yes Historical Provider, MD  nortriptyline (PAMELOR) 50 MG capsule Take 50 mg by mouth at bedtime.   Yes Historical Provider, MD  propranolol ER (INDERAL LA) 120 MG 24 hr capsule Take 120 mg by mouth daily.   Yes Historical Provider, MD  hydrocortisone cream 1 % Apply to affected area 2 times daily 11/28/13   Monte Fantasia, PA-C   BP 109/75  Pulse 93  Temp(Src) 98.3 F (36.8 C) (Oral)  Resp 16  Ht 5\' 5"  (1.651 m)  Wt 171 lb (77.565 kg)  BMI 28.46 kg/m2  SpO2 97%  LMP 11/19/2013  Physical Exam  Nursing note and vitals reviewed. Constitutional: She is oriented to person, place, and time. She appears well-developed and well-nourished. No distress.  HENT:  Head: Normocephalic and atraumatic.  Eyes: Conjunctivae  and EOM are normal.  Neck: Neck supple. No tracheal deviation present.  Cardiovascular: Normal rate.   Pulmonary/Chest: Effort normal. No respiratory distress.  Musculoskeletal: Normal range of motion.  Neurological: She is alert and oriented to person, place, and time.  Skin: Skin is warm and dry.     Psychiatric: She has a normal mood and affect. Her behavior is normal.    ED Course  Procedures (including critical care time)  DIAGNOSTIC STUDIES: Oxygen Saturation is 98% on RA, normal by my interpretation.    COORDINATION OF CARE: 11:45 PM-Discussed treatment plan which includes Claritin,  hydrocortisone cream and continuing benadryl with pt at bedside and pt agreed to plan.   Labs Review Labs Reviewed - No data to display  Imaging Review No results found.   EKG Interpretation None      MDM   Final diagnoses:  Bee sting, accidental or unintentional, initial encounter   Patient here for evaluation of a sting from a wasp that occurred yesterday. Patient reports ongoing pruritus, redness to the area does stain despite using Benadryl at home. Patient exam consistent with a bee sting on her leg, no obvious abscess, infection. No hives, shortness of breath, sore throat, hoarseness, nausea, vomiting, signs or symptoms of anaphylaxis. Patient denying having any allergy to bees. Plan for discharge, prescription with hydrocortisone cream to help with pruritus and mild redness/swelling to bee sting. I encouraged patient to follow up with primary care physician, and return to the ER should her symptoms persist, change, worsen or should she have a questions or concerns.  BP 109/75  Pulse 93  Temp(Src) 98.3 F (36.8 C) (Oral)  Resp 16  Ht 5\' 5"  (1.651 m)  Wt 171 lb (77.565 kg)  BMI 28.46 kg/m2  SpO2 97%  LMP 11/19/2013  Signed,  Ladona MowJoe Lamont Tant, PA-C 4:11 AM  I personally performed the services described in this documentation, which was scribed in my presence. The recorded information has been reviewed and is accurate.  Monte FantasiaJoseph W Finnegan Gatta, PA-C 11/28/13 [phone]

## 2013-11-28 MED ORDER — HYDROCORTISONE 1 % EX CREA: TOPICAL_CREAM | CUTANEOUS | Status: DC

## 2013-11-28 NOTE — Discharge Instructions (Signed)

## 2013-11-28 NOTE — ED Provider Notes (Signed)
Medical screening examination/treatment/procedure(s) were performed by non-physician practitioner and as supervising physician I was immediately available for consultation/collaboration.  Megan Docherty, MD 11/28/13 0922 

## 2013-12-24 ENCOUNTER — Encounter: Payer: Self-pay | Admitting: Neurology

## 2013-12-24 ENCOUNTER — Ambulatory Visit (INDEPENDENT_AMBULATORY_CARE_PROVIDER_SITE_OTHER): Payer: Medicaid Other | Admitting: Neurology

## 2013-12-24 VITALS — BP 115/80 | HR 78 | Ht 65.0 in | Wt 170.0 lb

## 2013-12-24 DIAGNOSIS — G43909 Migraine, unspecified, not intractable, without status migrainosus: Secondary | ICD-10-CM

## 2013-12-24 MED ORDER — DIVALPROEX SODIUM ER 500 MG PO TB24: 500.0000 mg | ORAL_TABLET | Freq: Every day | ORAL | Status: DC

## 2013-12-24 MED ORDER — SUMATRIPTAN SUCCINATE 6 MG/0.5ML ~~LOC~~ SOLN: 6.0000 mg | SUBCUTANEOUS | Status: DC | PRN

## 2013-12-24 NOTE — Progress Notes (Signed)
PATIENT: Brooke [name] DOB: [birth year]  HISTORICAL  Brooke [name] a 38 year old right-handed female, referred by her primary care physician Dr. Julio Sicks for evaluation of diffuse body achy pain  She carries a diagnosis of chronic migraine, fibromyalgia, since 2011,She complains of chronic neck pain, low back pain, joints pain, radiating pain to her left arm,  She moved from [place] to [place] in November 2013, has been under the [facility] pain management. Over the years, she has tried different medications, currently taking nortriptyline 50 mg every night, Inderal ER 120 mg every night as headache prevention  Previously has tried and failed Topamax, Botox injection, trigger point injection, which actually made her headache worse,while she was in [place], she reported frequent emergency room visit, 3 times each months, receiving Dilaudid, She was also treated with Opana, fentanyl patch, Demerol in the past. she is now currently taking Norco,from pain management  She reported long-standing history of migraine, her typical migraine or right retro-orbital area severe pounding headache with associated light noise sensitivity, resting helps, lasting few hours,  But over past 1 months, she had daily moderate to severe headaches, spreading to occipital, upper nuchal regions  She has tried Fioricet, Flexeril, Norco,without help, previously she has tried different triptan's, including Imitrex, Zomig, Maxalt, without helping,  She complains of few years history of bilatelr feet parestheisa.increased after barium weight reported abnormal EMG nerve conduction study from outside facility in May 2015 consistent with peripheral neuropathy  We have reviewed MRI of cervical, and lumbar spine at [facility] in June 2015, there was evidence of multilevel degenerative disc disease, there was no significant canal, or foraminal stenosis  REVIEW OF SYSTEMS: Full 14 system review of systems  performed and notable only for light sensitivity, blurred vision, apnea, memory loss, dizziness, headaches, tremor, agitation, depression, anxiety  ALLERGIES: Allergies  Allergen Reactions  . Digoxin And Related Nausea And Vomiting    Elevated 'white count'  . Latex Hives and Swelling  . Other     Any nasal sprays cause serious migranes  . Toradol [Ketorolac Tromethamine] Other (See Comments)    Uncontrollable 'shakes'    HOME MEDICATIONS: Current Outpatient Prescriptions on File Prior to Visit  Medication Sig Dispense Refill  . butalbital-acetaminophen-caffeine (FIORICET, ESGIC) 50-325-40 MG per tablet Take 1 tablet by mouth 2 (two) times daily as needed for headache.    . cyclobenzaprine (FLEXERIL) 10 MG tablet Take 10 mg by mouth 2 (two) times daily.    . diphenhydrAMINE (BENADRYL) 25 mg capsule Take 25 mg by mouth every 6 (six) hours as needed for itching.    Marland Kitchen HYDROcodone-acetaminophen (NORCO/VICODIN) 5-325 MG per tablet Take 1 tablet by mouth every 6 (six) hours as needed for moderate pain.    . hydrocortisone cream 1 % Apply to affected area 2 times daily (Patient taking differently: 1 application as needed. Apply to affected area 2 times daily) 15 g 0  . nortriptyline (PAMELOR) 50 MG capsule Take 50 mg by mouth at bedtime.    . propranolol ER (INDERAL LA) 120 MG 24 hr capsule Take 120 mg by mouth daily.     No current facility-administered medications on file prior to visit.    PAST MEDICAL HISTORY: Past Medical History  Diagnosis Date  . Fibromyalgia   . DDD (degenerative disc disease), cervical   . Migraine   . WPW (Wolff-Parkinson-White syndrome)   . Depression   . Anxiety     PAST SURGICAL HISTORY: Past Surgical History  Procedure Laterality Date  .  Knee surgery Bilateral     Right Knee x 1 - Left x3  . Cesarean section    . Tubal ligation    . Eye surgery Bilateral   . Ears Bilateral   . Hernia repair    . Vein removed Left     arm    FAMILY  HISTORY: Family History  Problem Relation Age of Onset  . Migraines Mother   . CAD Father   . Migraines Daughter     SOCIAL HISTORY:  History   Social History  . Marital Status: Divorced    Spouse Name: N/A    Number of Children: 2  . Years of Education: 12   Occupational History    Not working   Social History Main Topics  . Smoking status: Current Every Day Smoker  . Smokeless tobacco: Never Used  . Alcohol Use: No  . Drug Use: No  . Sexual Activity: Not on file   Other Topics Concern  . Not on file   Social History Narrative   Patient lives at home with her two children and her mother.   Unemployed. - Patient trying to get social security.   Education high school.   Right handed.   Caffeine soda's four soda's daily.     PHYSICAL EXAM   Filed Vitals:   12/24/13 1040  BP: 115/80  Pulse: 78  Height: 5\' 5"  (1.651 m)  Weight: 170 lb (77.111 kg)    Not recorded      Body mass index is 28.29 kg/(m^2).   Generalized: In no acute distress  Neck: Supple, no carotid bruits   Cardiac: Regular rate rhythm  Pulmonary: Clear to auscultation bilaterally  Musculoskeletal: No deformity  Neurological examination  Mentation: Alert oriented to time, place, history taking, and causual conversation,depressed looking middle-aged female  Cranial nerve II-XII: Pupils were equal round reactive to light. Extraocular movements were full.  Visual field were full on confrontational test. Bilateral fundi were sharp.  Facial sensation and strength were normal. Hearing was intact to finger rubbing bilaterally. Uvula tongue midline.  Head turning and shoulder shrug and were normal and symmetric.Tongue protrusion into cheek strength was normal.  Motor: Normal tone, bulk and strength.  Sensory: length dependent decreased to fine touch, pinprick to above ankle, absent vibratory sensation at toes, and proprioception at toes.  Coordination: Normal finger to nose, heel-to-shin  bilaterally there was no truncal ataxia  Gait: Rising up from seated position without assistance, normal stance, without trunk ataxia, moderate stride, good arm swing, smooth turning, able to perform tiptoe, and heel walking without difficulty.   Romberg signs: Negative  Deep tendon reflexes: Brachioradialis 2/2, biceps 2/2, triceps 2/2, patellar 2/2, Achilles trace, plantar responses were flexor bilaterally.   DIAGNOSTIC DATA (LABS, IMAGING, TESTING) - I reviewed patient records, labs, notes, testing and imaging myself where available.  Lab Results  Component Value Date   WBC 8.0 08/26/2013   HGB 13.5 08/26/2013   HCT 41.1 08/26/2013   MCV 89.3 08/26/2013   PLT 193 08/26/2013      Component Value Date/Time   NA 139 08/26/2013 1040   K 4.3 08/26/2013 1040   CL 103 08/26/2013 1040   CO2 20 08/26/2013 1040   GLUCOSE 94 08/26/2013 1040   BUN 7 08/26/2013 1040   CREATININE 0.62 08/26/2013 1040   CALCIUM 9.1 08/26/2013 1040   PROT 7.5 06/14/2013 1238   ALBUMIN 3.3* 06/14/2013 1238   AST 14 06/14/2013 1238   ALT 16 06/14/2013  1238   ALKPHOS 113 06/14/2013 1238   BILITOT 0.4 06/14/2013 1238   GFRNONAA >90 08/26/2013 1040   GFRAA >90 08/26/2013 1040   ASSESSMENT AND PLAN  Brooke [name] is a 38 y.o. female with past medical history of fibromyalgia, chronic neck, and low back pain, now presenting with one month history of persistent headaches,  Essentially normal neurological examinations, With exception of decreased vibratory sensation at her toes  1, migraine, fibromyalgia, has been on preventive medications Inderal, nortriptyline, will add on Depakote ER 500 mg every night 2. Imitrex subcutaneous injection as needed 3. She has evidence of peripheral neuropathy, will get outside EMG nerve conduction study report, laboratory evaluation from primary care physician 4. Return to clinic in 3 months    Brooke [name], M.D. Ph.D.  [facility] [address],  [address] [place], [place] [address] [phone]

## 2014-01-21 ENCOUNTER — Telehealth: Payer: Self-pay | Admitting: Neurology

## 2014-01-21 NOTE — Telephone Encounter (Signed)
Her primary care physician Dr.George Osei-Bonsu, MD Has called our office, patient has been complaining increased headaches,  Brooke [name], please give her a follow-up appointment with Eber Jonesarolyn in one week

## 2014-01-22 NOTE — Telephone Encounter (Signed)
Called and spoke to patient and she is coming in this Friday to see Dr.Yan.

## 2014-01-22 NOTE — Telephone Encounter (Signed)
Dr.Yan Eber JonesCarolyn has non thing available until the end of January.

## 2014-01-22 NOTE — Telephone Encounter (Signed)
Then put her on my next available.

## 2014-01-24 ENCOUNTER — Telehealth: Payer: Self-pay | Admitting: Neurology

## 2014-01-24 ENCOUNTER — Ambulatory Visit: Payer: Self-pay | Admitting: Neurology

## 2014-01-24 NOTE — Telephone Encounter (Signed)
Patient overslept for appointment on 12/4.  Requesting to be rescheduled asap.  Please call and advise.

## 2014-01-27 ENCOUNTER — Emergency Department (HOSPITAL_COMMUNITY): Payer: Medicaid Other

## 2014-01-27 ENCOUNTER — Encounter (HOSPITAL_COMMUNITY): Payer: Self-pay | Admitting: Physical Medicine and Rehabilitation

## 2014-01-27 ENCOUNTER — Emergency Department (HOSPITAL_COMMUNITY)
Admission: EM | Admit: 2014-01-27 | Discharge: 2014-01-27 | Disposition: A | Discharge status: Home / Self Care | Payer: Medicaid Other | Attending: Emergency Medicine | Admitting: Emergency Medicine

## 2014-01-27 DIAGNOSIS — R51 Headache: Secondary | ICD-10-CM | POA: Insufficient documentation

## 2014-01-27 DIAGNOSIS — Z7952 Long term (current) use of systemic steroids: Secondary | ICD-10-CM | POA: Diagnosis not present

## 2014-01-27 DIAGNOSIS — Z8679 Personal history of other diseases of the circulatory system: Secondary | ICD-10-CM | POA: Diagnosis not present

## 2014-01-27 DIAGNOSIS — Z91040 Latex allergy status: Secondary | ICD-10-CM | POA: Insufficient documentation

## 2014-01-27 DIAGNOSIS — M797 Fibromyalgia: Secondary | ICD-10-CM | POA: Diagnosis not present

## 2014-01-27 DIAGNOSIS — Z720 Tobacco use: Secondary | ICD-10-CM | POA: Insufficient documentation

## 2014-01-27 DIAGNOSIS — F329 Major depressive disorder, single episode, unspecified: Secondary | ICD-10-CM | POA: Insufficient documentation

## 2014-01-27 DIAGNOSIS — F419 Anxiety disorder, unspecified: Secondary | ICD-10-CM | POA: Insufficient documentation

## 2014-01-27 DIAGNOSIS — Z79899 Other long term (current) drug therapy: Secondary | ICD-10-CM | POA: Insufficient documentation

## 2014-01-27 DIAGNOSIS — R519 Headache, unspecified: Secondary | ICD-10-CM

## 2014-01-27 MED ORDER — DEXAMETHASONE SODIUM PHOSPHATE 10 MG/ML IJ SOLN
10.0000 mg | Freq: Once | INTRAMUSCULAR | Status: AC
  Administered 2014-01-27: 10 mg via INTRAVENOUS
  Filled 2014-01-27: qty 1

## 2014-01-27 MED ORDER — SODIUM CHLORIDE 0.9 % IV BOLUS (SEPSIS)
1000.0000 mL | Freq: Once | INTRAVENOUS | Status: AC
Start: 2014-01-27 — End: 2014-01-27
  Administered 2014-01-27: 1000 mL via INTRAVENOUS

## 2014-01-27 MED ORDER — HYDROMORPHONE HCL 1 MG/ML IJ SOLN
2.0000 mg | Freq: Once | INTRAMUSCULAR | Status: AC
  Administered 2014-01-27: 2 mg via INTRAVENOUS
  Filled 2014-01-27: qty 2

## 2014-01-27 MED ORDER — METOCLOPRAMIDE HCL 5 MG/ML IJ SOLN
10.0000 mg | Freq: Once | INTRAMUSCULAR | Status: AC
  Administered 2014-01-27: 10 mg via INTRAVENOUS
  Filled 2014-01-27: qty 2

## 2014-01-27 MED ORDER — MAGNESIUM SULFATE IN D5W 10-5 MG/ML-% IV SOLN
1.0000 g | Freq: Once | INTRAVENOUS | Status: AC
  Administered 2014-01-27: 1 g via INTRAVENOUS
  Filled 2014-01-27 (×2): qty 100

## 2014-01-27 NOTE — ED Notes (Signed)
Pt presents to department for evaluation of headache x2 months. Pt reports photosensitivity, blurred vision and nausea. 10/10 pain upon arrival to ED. No neurological deficits noted. Pt is alert and oriented x4. NAD.

## 2014-01-27 NOTE — Discharge Instructions (Signed)

## 2014-01-27 NOTE — ED Provider Notes (Signed)
CSN: [id number]     Arrival date & time 01/27/14  1332 History   First MD Initiated Contact with Patient 01/27/14 1608     Chief Complaint  Patient presents with  . Headache     (Consider location/radiation/quality/duration/timing/severity/associated sxs/prior Treatment) HPI Comments: Migraine for 1 month, no relief with abortive therapies at home. Worsening R temple pain for past 2 days. Throbbing, nonradiating. No alleviating or exacerbating factors of her acutely worsening headache. No prior headaches like this.   Patient is a 38 y.o. female presenting with headaches. The history is provided by the patient.  Headache Pain location:  R temporal Quality:  Sharp Radiates to:  Does not radiate Onset quality:  Gradual Duration:  2 days Timing:  Constant Progression:  Worsening Chronicity:  Chronic Similar to prior headaches: no   Context: not activity and not exposure to bright light   Relieved by:  Nothing Worsened by:  Nothing tried Associated symptoms: no abdominal pain, no cough, no fever and no vomiting     Past Medical History  Diagnosis Date  . Fibromyalgia   . DDD (degenerative disc disease), cervical   . Migraine   . WPW (Wolff-Parkinson-White syndrome)   . Depression   . Anxiety    Past Surgical History  Procedure Laterality Date  . Knee surgery Bilateral     Right Knee x 1 - Left x3  . Cesarean section    . Tubal ligation    . Eye surgery Bilateral   . Ears Bilateral   . Hernia repair    . Vein removed Left     arm   Family History  Problem Relation Age of Onset  . Migraines Mother   . CAD Father   . Migraines Daughter    History  Substance Use Topics  . Smoking status: Current Every Day Smoker    Types: Cigarettes  . Smokeless tobacco: Never Used  . Alcohol Use: No   OB History    No data available     Review of Systems  Constitutional: Negative for fever.  Respiratory: Negative for cough and shortness of breath.   Gastrointestinal:  Negative for vomiting and abdominal pain.  Neurological: Positive for headaches.  All other systems reviewed and are negative.     Allergies  Digoxin and related; Latex; Other; and Toradol  Home Medications   Prior to Admission medications   Medication Sig Start Date End Date Taking? Authorizing Provider  butalbital-acetaminophen-caffeine (FIORICET, ESGIC) 50-325-40 MG per tablet Take 1 tablet by mouth 2 (two) times daily as needed for headache.    Historical Provider, MD  cyclobenzaprine (FLEXERIL) 10 MG tablet Take 10 mg by mouth 2 (two) times daily.    Historical Provider, MD  diphenhydrAMINE (BENADRYL) 25 mg capsule Take 25 mg by mouth every 6 (six) hours as needed for itching.    Historical Provider, MD  divalproex (DEPAKOTE ER) 500 MG 24 hr tablet Take 1 tablet (500 mg total) by mouth daily. 12/24/13   Levert FeinsteinYijun Yan, MD  HYDROcodone-acetaminophen (NORCO/VICODIN) 5-325 MG per tablet Take 1 tablet by mouth every 6 (six) hours as needed for moderate pain.    Historical Provider, MD  hydrocortisone cream 1 % Apply to affected area 2 times daily Patient taking differently: 1 application as needed. Apply to affected area 2 times daily 11/28/13   Monte FantasiaJoseph W Mintz, PA-C  nortriptyline (PAMELOR) 50 MG capsule Take 50 mg by mouth at bedtime.    Historical Provider, MD  propranolol ER (INDERAL  LA) 120 MG 24 hr capsule Take 120 mg by mouth daily.    Historical Provider, MD  SUMAtriptan (IMITREX) 6 MG/0.5ML SOLN injection Inject 0.5 mLs (6 mg total) into the skin every 2 (two) hours as needed for migraine or headache. May repeat in 2 hours if headache persists or recurs. 12/24/13   Levert FeinsteinYijun Yan, MD   BP 120/79 mmHg  Pulse 85  Temp(Src) 98.3 F (36.8 C) (Oral)  Resp 17  Ht 5\' 5"  (1.651 m)  Wt 171 lb (77.565 kg)  BMI 28.46 kg/m2  SpO2 99% Physical Exam  Constitutional: She is oriented to person, place, and time. She appears well-developed and well-nourished. No distress.  HENT:  Head: Normocephalic  and atraumatic.  Mouth/Throat: Oropharynx is clear and moist.  Eyes: EOM are normal. Pupils are equal, round, and reactive to light.  Neck: Normal range of motion. Neck supple.  Cardiovascular: Normal rate and regular rhythm.  Exam reveals no friction rub.   No murmur heard. Pulmonary/Chest: Effort normal and breath sounds normal. No respiratory distress. She has no wheezes. She has no rales.  Abdominal: Soft. She exhibits no distension. There is no tenderness. There is no rebound.  Musculoskeletal: Normal range of motion. She exhibits no edema.  Neurological: She is alert and oriented to person, place, and time. No cranial nerve deficit. She exhibits normal muscle tone. Coordination normal.  Skin: She is not diaphoretic.  Nursing note and vitals reviewed.   ED Course  Procedures (including critical care time) Labs Review Labs Reviewed - No data to display  Imaging Review Ct Head Wo Contrast  01/27/2014   CLINICAL DATA:  Severe head pain for months. History of migraine headaches.  EXAM: CT HEAD WITHOUT CONTRAST  TECHNIQUE: Contiguous axial images were obtained from the base of the skull through the vertex without intravenous contrast.  COMPARISON:  09/12/2012.  FINDINGS: Ventricles are normal in size and configuration. There are no parenchymal masses or mass effect. There is no evidence of an infarct. There are no extra-axial masses or abnormal fluid collections.  No intracranial hemorrhage.  Visualized sinuses and mastoid air cells are clear. No skull lesion.  IMPRESSION: Normal unenhanced CT scan the brain.   Electronically Signed   By: Amie Portlandavid  Ormond M.D.   On: 01/27/2014 18:37     EKG Interpretation None      MDM   Final diagnoses:  Headache    17F presents with HA. Hx of chronic headaches, has required dilaudid in the past. R sided band-like pain began in R temple yesterday. No N/V, no blurry vision. Has sensation of throbbing and pressure. No relief with abortive measures at  home. Associated photosensitivity. AFVSS. Neuro exam nonfocal. Patient given headache cocktail, dilaudid. Will CT her head as she states she hasn't had this sensation before.  Head CT normal. Headache improving, appearing better. Stable for discharge.  Elwin MochaBlair Elzada Pytel, MD 01/27/14 [phone]

## 2014-01-27 NOTE — Telephone Encounter (Signed)
Called patient and offered first available with Dr Terrace ArabiaYan for 02/24/14 patient refused states that PCP wanted her to be seen within 1-2 weeks, patient states that she has had a Migraine for over 1 month and is currently in the ER. Please advise

## 2014-01-27 NOTE — ED Notes (Signed)
Pt monitored by pulse ox, bp cuff, and 5-lead. 

## 2014-01-28 ENCOUNTER — Encounter: Payer: Self-pay | Admitting: *Deleted

## 2014-01-28 NOTE — Telephone Encounter (Signed)
Dana: Please let patient's primary care physician now, she has missed her scheduled appointment in December fourth, and also reschedule her appointment at my next available, let the patient know, and her primary care physician know the date  She presented to ED December 7, CAT scan of the brain was normal

## 2014-01-29 NOTE — Telephone Encounter (Signed)
Called and spoke to patient patient stated per her PCP she need to follow up with Dr.Yan scheduled patient a follow and told her she needed to keep her appt. With Dr.Yan Patient understood process.

## 2014-01-30 ENCOUNTER — Telehealth: Payer: Self-pay | Admitting: Neurology

## 2014-01-30 NOTE — Telephone Encounter (Signed)
I called the pharmacy.  Spoke with Dorene GrebeNatalie.  She said they have refills on file and processed the Rx.  Says it went through without any problems.  Indicates they have not tried to contact us, because there were no issues.  I called the patient back.  They are aware.

## 2014-01-30 NOTE — Telephone Encounter (Signed)
Patient stated [facility] has sent authorization request for Rx SUMAtriptan (IMITREX) 6 MG/0.5ML SOLN injection and haven't heard back from office.  Please call and advise.

## 2014-02-03 ENCOUNTER — Encounter: Payer: Self-pay | Admitting: Neurology

## 2014-02-03 ENCOUNTER — Ambulatory Visit (INDEPENDENT_AMBULATORY_CARE_PROVIDER_SITE_OTHER): Payer: Medicaid Other | Admitting: Neurology

## 2014-02-03 VITALS — BP 104/67 | HR 68 | Ht 65.0 in | Wt 170.0 lb

## 2014-02-03 DIAGNOSIS — G43909 Migraine, unspecified, not intractable, without status migrainosus: Secondary | ICD-10-CM

## 2014-02-03 MED ORDER — PROMETHAZINE HCL 25 MG PO TABS: 25.0000 mg | ORAL_TABLET | Freq: Four times a day (QID) | ORAL | Status: DC | PRN

## 2014-02-03 NOTE — Patient Instructions (Signed)
Magnesium oxide 400mg Riboflavin 100mg twice a day 

## 2014-02-03 NOTE — Progress Notes (Signed)
PATIENT: Brooke [name] DOB: 05/22/1975  HISTORICAL  [name] Spradlinis a 38 year old right-handed female, referred by her primary care physician Dr. Julio Sickssei-Bonsu for evaluation of diffuse body achy pain  She carries a diagnosis of chronic migraine, fibromyalgia, since 2011,She complains of chronic neck pain, low back pain, joints pain, radiating pain to her left arm,  She moved from [place] to [place] in November 2013, has been under the [facility]. Over the years, she has tried different medications, currently taking nortriptyline 50 mg every night, Inderal ER 120 mg every night as headache prevention  Previously she has tried and failed Topamax, Botox injection, trigger point injection, which actually made her headache worse while she was in [place], she reported frequent emergency room visit, 3 times each month, receiving Dilaudid, She was also treated with Opana, fentanyl patch, Demerol in the past. she is now currently taking Norco,from pain management  She reported long-standing history of migraine, her typical migraine are right retro-orbital area severe pounding headache with associated light noise sensitivity, resting helps, lasting few hours,  But over past 1 months, she had daily moderate to severe headaches, spreading to occipital, upper nuchal region.  She has tried Fioricet, Flexeril, Norco,without help, previously she has tried different triptans, including Imitrex, Zomig, Maxalt, without helping,  She complains of few years history of bilatelr feet parestheisa.increased after bearing weight , reported abnormal EMG nerve conduction study from outside facility in May 2015 consistent with peripheral neuropathy  We have reviewed [facility] of cervical, and [facility] in June 2015, there was evidence of multilevel degenerative disc disease, there was no significant canal, or foraminal stenosis  UPDATE Dec 14th 2015: Since her last visit in  November third 2015, she went to the emergency room January 27 2014, for recurrent headaches, CAT scan of the brain was normal. Her headaches overall has much improved, Imitrex subcutaneous injection has been very helpful, she is on preventive medications, inderal, deapkote ER,  Nortriptyline,  She is the main caregiver of her grandson, who is one 43month old, her daughter is 36 years old, son is 36 years old   REVIEW OF SYSTEMS: Full 14 system review of systems performed and notable only for light sensitivity, blurred vision, apnea, memory loss, dizziness, headaches, tremor, agitation, depression, anxiety  ALLERGIES: Allergies  Allergen Reactions  . Digoxin And Related Nausea And Vomiting    Elevated 'white count'  . Latex Hives and Swelling  . Other     Any nasal sprays cause serious migranes  . Toradol [Ketorolac Tromethamine] Other (See Comments)    Uncontrollable 'shakes'    HOME MEDICATIONS: Current Outpatient Prescriptions on File Prior to Visit  Medication Sig Dispense Refill  . butalbital-acetaminophen-caffeine (FIORICET, ESGIC) 50-325-40 MG per tablet Take 1 tablet by mouth 2 (two) times daily as needed for headache.    . cyclobenzaprine (FLEXERIL) 10 MG tablet Take 10 mg by mouth 2 (two) times daily.    . diphenhydrAMINE (BENADRYL) 25 mg capsule Take 25 mg by mouth every 6 (six) hours as needed for itching.    . divalproex (DEPAKOTE ER) 500 MG 24 hr tablet Take 1 tablet (500 mg total) by mouth daily. 30 tablet 11  . HYDROcodone-acetaminophen (NORCO/VICODIN) 5-325 MG per tablet Take 1 tablet by mouth every 6 (six) hours as needed for moderate pain.    . hydrocortisone cream 1 % Apply to affected area 2 times daily (Patient taking differently: 1 application as needed. Apply to affected area 2  times daily) 15 g 0  . nortriptyline (PAMELOR) 50 MG capsule Take 50 mg by mouth at bedtime.    . propranolol ER (INDERAL LA) 120 MG 24 hr capsule Take 120 mg by mouth daily.    .  SUMAtriptan (IMITREX) 6 MG/0.5ML SOLN injection Inject 0.5 mLs (6 mg total) into the skin every 2 (two) hours as needed for migraine or headache. May repeat in 2 hours if headache persists or recurs. 5 mL 11   No current facility-administered medications on file prior to visit.    PAST MEDICAL HISTORY: Past Medical History  Diagnosis Date  . Fibromyalgia   . DDD (degenerative disc disease), cervical   . Migraine   . WPW (Wolff-Parkinson-White syndrome)   . Depression   . Anxiety     PAST SURGICAL HISTORY: Past Surgical History  Procedure Laterality Date  . Knee surgery Bilateral     Right Knee x 1 - Left x3  . Cesarean section    . Tubal ligation    . Eye surgery Bilateral   . Ears Bilateral   . Hernia repair    . Vein removed Left     arm    FAMILY HISTORY: Family History  Problem Relation Age of Onset  . Migraines Mother   . CAD Father   . Migraines Daughter     SOCIAL HISTORY:  History   Social History  . Marital Status: Divorced    Spouse Name: N/A    Number of Children: 2  . Years of Education: 12   Occupational History    Not working   Social History Main Topics  . Smoking status: Current Every Day Smoker  . Smokeless tobacco: Never Used  . Alcohol Use: No  . Drug Use: No  . Sexual Activity: Not on file   Other Topics Concern  . Not on file   Social History Narrative   Patient lives at home with her two children and her mother.   Unemployed. - Patient trying to get social security.   Education high school.   Right handed.   Caffeine soda's four soda's daily.     PHYSICAL EXAM   Filed Vitals:   02/03/14 1136  BP: 104/67  Pulse: 68  Height: 5\' 5"  (1.651 m)  Weight: 170 lb (77.111 kg)    Not recorded      Body mass index is 28.29 kg/(m^2).   Generalized: In no acute distress  Neck: Supple, no carotid bruits   Cardiac: Regular rate rhythm  Pulmonary: Clear to auscultation bilaterally  Musculoskeletal: No  deformity  Neurological examination  Mentation: Alert oriented to time, place, history taking, and causual conversation,depressed looking middle-aged female  Cranial nerve II-XII: Pupils were equal round reactive to light. Extraocular movements were full.  Visual field were full on confrontational test. Bilateral fundi were sharp.  Facial sensation and strength were normal. Hearing was intact to finger rubbing bilaterally. Uvula tongue midline.  Head turning and shoulder shrug and were normal and symmetric.Tongue protrusion into cheek strength was normal.  Motor: Normal tone, bulk and strength.  Sensory: length dependent decreased to fine touch, pinprick to above ankle, absent vibratory sensation at toes, and proprioception at toes.  Coordination: Normal finger to nose, heel-to-shin bilaterally there was no truncal ataxia  Gait: Rising up from seated position without assistance, normal stance, without trunk ataxia, moderate stride, good arm swing, smooth turning, able to perform tiptoe, and heel walking without difficulty.   Romberg signs: Negative  Deep  tendon reflexes: Brachioradialis 2/2, biceps 2/2, triceps 2/2, patellar 2/2, Achilles trace, plantar responses were flexor bilaterally.  DIAGNOSTIC DATA (LABS, IMAGING, TESTING) - I reviewed patient records, labs, notes, testing and imaging myself where available.  Lab Results  Component Value Date   WBC 8.0 08/26/2013   HGB 13.5 08/26/2013   HCT 41.1 08/26/2013   MCV 89.3 08/26/2013   PLT 193 08/26/2013      Component Value Date/Time   NA 139 08/26/2013 1040   K 4.3 08/26/2013 1040   CL 103 08/26/2013 1040   CO2 20 08/26/2013 1040   GLUCOSE 94 08/26/2013 1040   BUN 7 08/26/2013 1040   CREATININE 0.62 08/26/2013 1040   CALCIUM 9.1 08/26/2013 1040   PROT 7.5 06/14/2013 1238   ALBUMIN 3.3* 06/14/2013 1238   AST 14 06/14/2013 1238   ALT 16 06/14/2013 1238   ALKPHOS 113 06/14/2013 1238   BILITOT 0.4 06/14/2013 1238    GFRNONAA >90 08/26/2013 1040   GFRAA >90 08/26/2013 1040   ASSESSMENT AND PLAN  Leeah Politano is a 38 y.o. female with past medical history of fibromyalgia, chronic neck, and low back pain, presenting with frequent headaches with migraine features, overall has much improved last month  1, migraine, keep current preventive medications Inderal, nortriptyline, Depakote ER 500 mg every night 2. Imitrex subcutaneous injection as needed, may also use Aleve, Phenergan as needed 3, I also suggested magnesium oxide 400 mg twice a day, riboflavin 100 mg twice a day 4. Return to clinic in 3 months was Lorinda Creed, M.D. Ph.D.  [facility] [address] [place], [place] [address] [phone]

## 2014-03-26 ENCOUNTER — Encounter: Payer: Self-pay | Admitting: Neurology

## 2014-03-26 ENCOUNTER — Ambulatory Visit (INDEPENDENT_AMBULATORY_CARE_PROVIDER_SITE_OTHER): Payer: Medicaid Other | Admitting: Neurology

## 2014-03-26 VITALS — BP 126/91 | HR 91 | Ht 65.0 in | Wt 170.0 lb

## 2014-03-26 DIAGNOSIS — G43719 Chronic migraine without aura, intractable, without status migrainosus: Secondary | ICD-10-CM | POA: Insufficient documentation

## 2014-03-26 MED ORDER — ELETRIPTAN HYDROBROMIDE 40 MG PO TABS: 40.0000 mg | ORAL_TABLET | ORAL | Status: DC | PRN

## 2014-03-26 NOTE — Progress Notes (Signed)
PATIENT: Forrestine HimHolly Base DOB: 09/12/1975  HISTORICAL (initial Nov 2015)  [name] Spradlinis a 39 year old right-handed female female, referred by her primary care physician Dr. Julio Sickssei-Bonsu for evaluation of diffuse body achy pain, migraine  She carries a diagnosis of chronic migraine, fibromyalgia, since 2011,She complains of chronic neck pain, low back pain, joints pain, radiating pain to her left arm,  She moved [place] to [place] in November 2013, has been under the [facility] management. Over the years, she has tried different medications, currently taking nortriptyline 50 mg every night, Inderal ER 120 mg every night as headache prevention  Previously she has tried and failed Topamax, trigger point injection, which actually made her headache worse while she was in [place], she reported frequent emergency room visit, 3 times each month, receiving Dilaudid, She was also treated with Opana, fentanyl patch, Demerol in the past. she is now  taking Norco,from her pain management  She reported long-standing history of migraine, her typical migraine are right retro-orbital area severe pounding headache with associated light noise sensitivity, resting helps, lasting few hours,  But over past 1 months, she had daily moderate to severe headaches, spreading to occipital, upper nuchal region.  She has tried Fioricet, Flexeril, Norco,without help, previously she has tried different triptans, including Imitrex, Zomig, Maxalt, without helping,  She complains of few years history of bilateral feet parestheisa.increased after bearing weight , reported abnormal EMG nerve conduction study from outside facility in May 2015 consistent with peripheral neuropathy  We have reviewed [facility], there was evidence of multilevel degenerative disc disease, there was no significant canal, or foraminal stenosis  UPDATE Dec 14th 2015: Since her last visit  in November 3rd 2015, she went to the emergency room January 27 2014, for recurrent headaches, CAT scan of the brain was normal. Her headaches overall has much improved, Imitrex subcutaneous injection has been very helpful, she is on preventive medications, inderal, deapkote ER,  Nortriptyline,  She is the main caregiver of her grandson, who is one 69month old, her daughter is 37 years old, son is 37 years old   UPDATE Feb 3rd 2016:  She continued to have frequent headache, 2-3 times each week, for at least 6 months, since August 2015, each headache lasts about 1-2 days, moderate to severe pounding headaches with associated light noise sensitivity, nauseous, sleeping helps, She has tried and failed preventive medications Topamax, Depakote, Inderal, nortriptyline, Flexeril,  Has tried different abortive treatment, including Imitrex injection, sometimes make her headache worse, Zomig, Maxalt, Imitrex tablets  Trigger for her headaches are environmental allergy, certain food, such as spicy food, acidic food,  REVIEW OF SYSTEMS: Full 14 system review of systems performed and notable only for as above ALLERGIES: Allergies  Allergen Reactions  . Digoxin And Related Nausea And Vomiting    Elevated 'white count'  . Latex Hives and Swelling  . Other     Any nasal sprays cause serious migranes  . Toradol [Ketorolac Tromethamine] Other (See Comments)    Uncontrollable 'shakes'    HOME MEDICATIONS: Current Outpatient Prescriptions on File Prior to Visit  Medication Sig Dispense Refill  . butalbital-acetaminophen-caffeine (FIORICET, ESGIC) 50-325-40 MG per tablet Take 1 tablet by mouth 2 (two) times daily as needed for headache.    . cyclobenzaprine (FLEXERIL) 10 MG tablet Take 10 mg by mouth 2 (two) times daily.    . divalproex (DEPAKOTE ER) 500 MG 24 hr tablet Take 1 tablet (  500 mg total) by mouth daily. 30 tablet 11  . HYDROcodone-acetaminophen (NORCO/VICODIN) 5-325 MG per tablet Take 1 tablet  by mouth every 6 (six) hours as needed for moderate pain.    . hydrocortisone cream 1 % Apply to affected area 2 times daily (Patient taking differently: 1 application as needed. Apply to affected area 2 times daily) 15 g 0  . nortriptyline (PAMELOR) 50 MG capsule Take 50 mg by mouth at bedtime.    . promethazine (PHENERGAN) 25 MG tablet Take 1 tablet (25 mg total) by mouth every 6 (six) hours as needed for nausea or vomiting. 30 tablet 3  . propranolol ER (INDERAL LA) 120 MG 24 hr capsule Take 120 mg by mouth daily.    . SUMAtriptan (IMITREX) 6 MG/0.5ML SOLN injection Inject 0.5 mLs (6 mg total) into the skin every 2 (two) hours as needed for migraine or headache. May repeat in 2 hours if headache persists or recurs. 5 mL 11  . SUMAtriptan 6 MG/0.5ML SOAJ   11   No current facility-administered medications on file prior to visit.    PAST MEDICAL HISTORY: Past Medical History  Diagnosis Date  . Fibromyalgia   . DDD (degenerative disc disease), cervical   . Migraine   . WPW (Wolff-Parkinson-White syndrome)   . Depression   . Anxiety     PAST SURGICAL HISTORY: Past Surgical History  Procedure Laterality Date  . Knee surgery Bilateral     Right Knee x 1 - Left x3  . Cesarean section    . Tubal ligation    . Eye surgery Bilateral   . Ears Bilateral   . Hernia repair    . Vein removed Left     arm    FAMILY HISTORY: Family History  Problem Relation Age of Onset  . Migraines Mother   . CAD Father   . Migraines Daughter     SOCIAL HISTORY:  History   Social History  . Marital Status: Divorced    Spouse Name: N/A    Number of Children: 2  . Years of Education: 12   Occupational History    Not working   Social History Main Topics  . Smoking status: Current Every Day Smoker  . Smokeless tobacco: Never Used  . Alcohol Use: No  . Drug Use: No  . Sexual Activity: Not on file   Other Topics Concern  . Not on file   Social History Narrative   Patient lives at home  with her two children and her mother.   Unemployed. - Patient trying to get social security.   Education high school.   Right handed.   Caffeine soda's four soda's daily.     PHYSICAL EXAM   Filed Vitals:   03/26/14 1109  BP: 126/91  Pulse: 91  Height:  (1.651 m)  Weight: 170 lb (77.111 kg)    Not recorded      Body mass index is 28.29 kg/(m^2).   Generalized: In no acute distress  Neck: Supple, no carotid bruits   Cardiac: Regular rate rhythm  Pulmonary: Clear to auscultation bilaterally  Musculoskeletal: No deformity  Neurological examination  Mentation: Alert oriented to time, place, history taking, and causual conversation,depressed looking middle-aged female  Cranial nerve II-XII: Pupils were equal round reactive to light. Extraocular movements were full.    Facial sensation and strength were normal. Hearing was intact to finger rubbing bilaterally. Uvula tongue midline.  Head turning and shoulder shrug and were normal and  symmetric.Tongue protrusion into cheek strength was normal.  Motor: Normal tone, bulk and strength.  Coordination: Normal finger to nose, heel-to-shin bilaterally there was no truncal ataxia  Gait: Rising up from seated position without assistance, normal stance, without trunk ataxia,     DIAGNOSTIC DATA (LABS, IMAGING, TESTING) - I reviewed patient records, labs, notes, testing and imaging myself where available.  Lab Results  Component Value Date   WBC 8.0 08/26/2013   HGB 13.5 08/26/2013   HCT 41.1 08/26/2013   MCV 89.3 08/26/2013   PLT 193 08/26/2013      Component Value Date/Time   NA 139 08/26/2013 1040   K 4.3 08/26/2013 1040   CL 103 08/26/2013 1040   CO2 20 08/26/2013 1040   GLUCOSE 94 08/26/2013 1040   BUN 7 08/26/2013 1040   CREATININE 0.62 08/26/2013 1040   CALCIUM 9.1 08/26/2013 1040   PROT 7.5 06/14/2013 1238   ALBUMIN 3.3* 06/14/2013 1238   AST 14 06/14/2013 1238   ALT 16 06/14/2013 1238   ALKPHOS 113  06/14/2013 1238   BILITOT 0.4 06/14/2013 1238   GFRNONAA >90 08/26/2013 1040   GFRAA >90 08/26/2013 1040   ASSESSMENT AND PLAN  Luva Metzger is a 39 y.o. female with past medical history of fibromyalgia, chronic neck, and low back pain, presenting with frequent headaches with migraine features, overall has much improved last month  1, migraine, keep current preventive medications Inderal, nortriptyline, Depakote ER 500 mg every night, 2. Relpax as needed 3, will try Botox injection as migraine prevention.  No orders of the defined types were placed in this encounter.    New Prescriptions   ELETRIPTAN (RELPAX) 40 MG TABLET    Take 1 tablet (40 mg total) by mouth as needed for migraine or headache. One tablet by mouth at onset of headache. May repeat in 2 hours if headache persists or recurs.    Medications Discontinued During This Encounter  Medication Reason  . diphenhydrAMINE (BENADRYL) 25 mg capsule Patient Preference    Return in about 2 weeks (around 04/09/2014).    Levert Feinstein, M.D. Ph.D.  [facility] [address], [address] [place], [place] [address] [phone]

## 2014-04-21 ENCOUNTER — Telehealth: Payer: Self-pay | Admitting: Neurology

## 2014-04-21 NOTE — Telephone Encounter (Signed)
This patient was denied authorization for injections from Medicaid. Due to not meeting the criteria.

## 2014-04-22 ENCOUNTER — Ambulatory Visit: Payer: Medicaid Other | Admitting: Certified Nurse Midwife

## 2014-05-05 ENCOUNTER — Ambulatory Visit: Payer: Medicaid Other | Admitting: Nurse Practitioner

## 2014-05-22 ENCOUNTER — Telehealth: Payer: Self-pay | Admitting: *Deleted

## 2014-05-22 NOTE — Telephone Encounter (Signed)
I called back and spoke with the patient.  We have not prescribed Fioricet.  She said her pain clinic prescribes Fioricet.  I recommended she contact them regarding this Rx.  She stated they will not refill this med until she is seen by their office and she is now out.  Patient is requesting a message be [facility] asking for a Rx (Dr Terrace ArabiaYan is out of the office).  I advised this would likely not be authorized because another doctor prescribes this med, and it is not recommended to have multiple providers writing the same med.   As well patient says she does not need Sumatriptan because it does not work.  Relpax Rx was sent to the pharmacy last month.  She will check with them regarding this.

## 2014-05-22 NOTE — Telephone Encounter (Signed)
We do not prescribe narcotics , and her pain clinic can not expect us to prescribe for those while a patient is awaiting an appointment.
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
## 2014-05-23 ENCOUNTER — Ambulatory Visit: Payer: Medicaid Other | Admitting: Certified Nurse Midwife

## 2014-07-12 ENCOUNTER — Encounter (HOSPITAL_COMMUNITY): Payer: Self-pay

## 2014-07-12 ENCOUNTER — Emergency Department (HOSPITAL_COMMUNITY): Payer: Medicaid Other

## 2014-07-12 ENCOUNTER — Emergency Department (HOSPITAL_COMMUNITY)
Admission: EM | Admit: 2014-07-12 | Discharge: 2014-07-12 | Disposition: A | Discharge status: Home / Self Care | Payer: Medicaid Other | Attending: Emergency Medicine | Admitting: Emergency Medicine

## 2014-07-12 DIAGNOSIS — H547 Unspecified visual loss: Secondary | ICD-10-CM | POA: Insufficient documentation

## 2014-07-12 DIAGNOSIS — R000 Tachycardia, unspecified: Secondary | ICD-10-CM | POA: Insufficient documentation

## 2014-07-12 DIAGNOSIS — S0993XA Unspecified injury of face, initial encounter: Secondary | ICD-10-CM | POA: Diagnosis not present

## 2014-07-12 DIAGNOSIS — Z7952 Long term (current) use of systemic steroids: Secondary | ICD-10-CM | POA: Diagnosis not present

## 2014-07-12 DIAGNOSIS — Z8739 Personal history of other diseases of the musculoskeletal system and connective tissue: Secondary | ICD-10-CM | POA: Diagnosis not present

## 2014-07-12 DIAGNOSIS — R519 Headache, unspecified: Secondary | ICD-10-CM

## 2014-07-12 DIAGNOSIS — F329 Major depressive disorder, single episode, unspecified: Secondary | ICD-10-CM | POA: Insufficient documentation

## 2014-07-12 DIAGNOSIS — Z79899 Other long term (current) drug therapy: Secondary | ICD-10-CM | POA: Diagnosis not present

## 2014-07-12 DIAGNOSIS — Q0700 Arnold-Chiari syndrome without spina bifida or hydrocephalus: Secondary | ICD-10-CM | POA: Insufficient documentation

## 2014-07-12 DIAGNOSIS — S060X0A Concussion without loss of consciousness, initial encounter: Secondary | ICD-10-CM | POA: Diagnosis not present

## 2014-07-12 DIAGNOSIS — S199XXA Unspecified injury of neck, initial encounter: Secondary | ICD-10-CM | POA: Diagnosis not present

## 2014-07-12 DIAGNOSIS — S299XXA Unspecified injury of thorax, initial encounter: Secondary | ICD-10-CM | POA: Diagnosis not present

## 2014-07-12 DIAGNOSIS — R110 Nausea: Secondary | ICD-10-CM | POA: Diagnosis not present

## 2014-07-12 DIAGNOSIS — R51 Headache: Secondary | ICD-10-CM

## 2014-07-12 DIAGNOSIS — Y9389 Activity, other specified: Secondary | ICD-10-CM | POA: Insufficient documentation

## 2014-07-12 DIAGNOSIS — Z720 Tobacco use: Secondary | ICD-10-CM | POA: Diagnosis not present

## 2014-07-12 DIAGNOSIS — Y999 Unspecified external cause status: Secondary | ICD-10-CM | POA: Diagnosis not present

## 2014-07-12 DIAGNOSIS — Z8679 Personal history of other diseases of the circulatory system: Secondary | ICD-10-CM | POA: Insufficient documentation

## 2014-07-12 DIAGNOSIS — Y929 Unspecified place or not applicable: Secondary | ICD-10-CM | POA: Insufficient documentation

## 2014-07-12 DIAGNOSIS — M542 Cervicalgia: Secondary | ICD-10-CM

## 2014-07-12 DIAGNOSIS — S0990XA Unspecified injury of head, initial encounter: Secondary | ICD-10-CM | POA: Diagnosis present

## 2014-07-12 DIAGNOSIS — Z91040 Latex allergy status: Secondary | ICD-10-CM | POA: Insufficient documentation

## 2014-07-12 DIAGNOSIS — G43709 Chronic migraine without aura, not intractable, without status migrainosus: Secondary | ICD-10-CM | POA: Insufficient documentation

## 2014-07-12 DIAGNOSIS — G43909 Migraine, unspecified, not intractable, without status migrainosus: Secondary | ICD-10-CM | POA: Diagnosis not present

## 2014-07-12 HISTORY — DX: Scoliosis, unspecified: M41.9

## 2014-07-12 HISTORY — DX: Reserved for concepts with insufficient information to code with codable children: IMO0002

## 2014-07-12 MED ORDER — OXYCODONE-ACETAMINOPHEN 5-325 MG PO TABS
1.0000 | ORAL_TABLET | Freq: Once | ORAL | Status: AC
  Administered 2014-07-12: 1 via ORAL
  Filled 2014-07-12: qty 1

## 2014-07-12 MED ORDER — METHOCARBAMOL 500 MG PO TABS
750.0000 mg | ORAL_TABLET | Freq: Once | ORAL | Status: AC
  Administered 2014-07-12: 750 mg via ORAL
  Filled 2014-07-12: qty 2

## 2014-07-12 MED ORDER — METHOCARBAMOL 500 MG PO TABS: 500.0000 mg | ORAL_TABLET | Freq: Three times a day (TID) | ORAL | Status: DC | PRN

## 2014-07-12 MED ORDER — MORPHINE SULFATE 4 MG/ML IJ SOLN
4.0000 mg | Freq: Once | INTRAMUSCULAR | Status: AC
  Administered 2014-07-12: 4 mg via INTRAMUSCULAR
  Filled 2014-07-12: qty 1

## 2014-07-12 NOTE — Discharge Instructions (Signed)
Take motrin or tylenol for pain, and use your home narcotic pain medications for breakthrough pain and robaxin for muscle relaxation. Do not drive or operate machinery with pain medication or muscle relaxation use. Ice to areas of soreness for the next few days and then may move to heat, no more than 20 minutes at a time for each. Expect to be sore for the next few days and follow up with primary care physician for recheck of ongoing symptoms. Stay in a quiet dark environment with no TV, cell phones, or stimulation until your headaches resolve, then you may return gradually to your typical activities. You need glasses, follow up with ophthalmologist listed above for ongoing care. Return to ER for emergent changing or worsening of symptoms.     Concussion A concussion is a brain injury. It is caused by:  A hit to the head.  A quick and sudden movement (jolt) of the head or neck. A concussion is usually not life threatening. Even so, it can cause serious problems. If you had a concussion before, you may have concussion-like problems after a hit to your head. HOME CARE General Instructions  Follow your doctor's directions carefully.  Take medicines only as told by your doctor.  Only take medicines your doctor says are safe.  Do not drink alcohol until your doctor says it is okay. Alcohol and some drugs can slow down healing. They can also put you at risk for further injury.  If you are having trouble remembering things, write them down.  Try to do one thing at a time if you get distracted easily. For example, do not watch TV while making dinner.  Talk to your family members or close friends when making important decisions.  Follow up with your doctor as told.  Watch your symptoms. Tell others to do the same. Serious problems can sometimes happen after a concussion. Older adults are more likely to have these problems.  Tell your teachers, school nurse, school counselor, coach, Pensions consultant, or work Production designer, theatre/television/film about your concussion. Tell them about what you can or cannot do. They should watch to see if:  It gets even harder for you to pay attention or concentrate.  It gets even harder for you to remember things or learn new things.  You need more time than normal to finish things.  You become annoyed (irritable) more than before.  You are not able to deal with stress as well.  You have more problems than before.  Rest. Make sure you:  Get plenty of sleep at night.  Go to sleep early.  Go to bed at the same time every day. Try to wake up at the same time.  Rest during the day.  Take naps when you feel tired.  Limit activities where you have to think a lot or concentrate. These include:  Doing homework.  Doing work related to a job.  Watching TV.  Using the computer. Returning To Your Regular Activities Return to your normal activities slowly, not all at once. You must give your body and brain enough time to heal.   Do not play sports or do other athletic activities until your doctor says it is okay.  Ask your doctor when you can drive, ride a bicycle, or work other vehicles or machines. Never do these things if you feel dizzy.  Ask your doctor about when you can return to work or school. Preventing Another Concussion It is very important to avoid another brain injury,  especially before you have healed. In rare cases, another injury can lead to permanent brain damage, brain swelling, or death. The risk of this is greatest during the first 7-10 days after your injury. Avoid injuries by:   Wearing a seat belt when riding in a car.  Not drinking too much alcohol.  Avoiding activities that could lead to a second concussion (such as contact sports).  Wearing a helmet when doing activities like:  Biking.  Skiing.  Skateboarding.  Skating.  Making your home safer by:  Removing things from the floor or stairways that could make you  trip.  Using grab bars in bathrooms and handrails by stairs.  Placing non-slip mats on floors and in bathtubs.  Improve lighting in dark areas. GET HELP IF:  It gets even harder for you to pay attention or concentrate.  It gets even harder for you to remember things or learn new things.  You need more time than normal to finish things.  You become annoyed (irritable) more than before.  You are not able to deal with stress as well.  You have more problems than before.  You have problems keeping your balance.  You are not able to react quickly when you should. Get help if you have any of these problems for more than 2 weeks:   Lasting (chronic) headaches.  Dizziness or trouble balancing.  Feeling sick to your stomach (nausea).  Seeing (vision) problems.  Being affected by noises or light more than normal.  Feeling sad, low, down in the dumps, blue, gloomy, or empty (depressed).  Mood changes (mood swings).  Feeling of fear or nervousness about what may happen (anxiety).  Feeling annoyed.  Memory problems.  Problems concentrating or paying attention.  Sleep problems.  Feeling tired all the time. GET HELP RIGHT AWAY IF:   You have bad headaches or your headaches get worse.  You have weakness (even if it is in one hand, leg, or part of the face).  You have loss of feeling (numbness).  You feel off balance.  You keep throwing up (vomiting).  You feel tired.  One black center of your eye (pupil) is larger than the other.  You twitch or shake violently (convulse).  Your speech is not clear (slurred).  You are more confused, easily angered (agitated), or annoyed than before.  You have more trouble resting than before.  You are unable to recognize people or places.  You have neck pain.  It is difficult to wake you up.  You have unusual behavior changes.  You pass out (lose consciousness). MAKE SURE YOU:   Understand these  instructions.  Will watch your condition.  Will get help right away if you are not doing well or get worse. Document Released: 01/26/2009 Document Revised: 06/24/2013 Document Reviewed: 08/30/2012 [facility] Patient Information 2015 [place], [place]. This information is not intended to replace advice given to you by your health care provider. Make sure you discuss any questions you have with your health care provider.  Cervical Sprain A cervical sprain is an injury in the neck in which the strong, fibrous tissues (ligaments) that connect your neck bones stretch or tear. Cervical sprains can range from mild to severe. Severe cervical sprains can cause the neck vertebrae to be unstable. This can lead to damage of the spinal cord and can result in serious nervous system problems. The amount of time it takes for a cervical sprain to get better depends on the cause and extent of the  injury. Most cervical sprains heal in 1 to 3 weeks. CAUSES  Severe cervical sprains may be caused by:   Contact sport injuries (such as from football, rugby, wrestling, hockey, auto racing, gymnastics, diving, martial arts, or boxing).   Motor vehicle collisions.   Whiplash injuries. This is an injury from a sudden forward and backward whipping movement of the head and neck.  Falls.  Mild cervical sprains may be caused by:   Being in an awkward position, such as while cradling a telephone between your ear and shoulder.   Sitting in a chair that does not offer proper support.   Working at a poorly Marketing executive station.   Looking up or down for long periods of time.  SYMPTOMS   Pain, soreness, stiffness, or a burning sensation in the front, back, or sides of the neck. This discomfort may develop immediately after the injury or slowly, 24 hours or more after the injury.   Pain or tenderness directly in the middle of the back of the neck.   Shoulder or upper back pain.   Limited ability to move  the neck.   Headache.   Dizziness.   Weakness, numbness, or tingling in the hands or arms.   Muscle spasms.   Difficulty swallowing or chewing.   Tenderness and swelling of the neck.  DIAGNOSIS  Most of the time your health care provider can diagnose a cervical sprain by taking your history and doing a physical exam. Your health care provider will ask about previous neck injuries and any known neck problems, such as arthritis in the neck. X-rays may be taken to find out if there are any other problems, such as with the bones of the neck. Other tests, such as a CT scan or MRI, may also be needed.  TREATMENT  Treatment depends on the severity of the cervical sprain. Mild sprains can be treated with rest, keeping the neck in place (immobilization), and pain medicines. Severe cervical sprains are immediately immobilized. Further treatment is done to help with pain, muscle spasms, and other symptoms and may include:  Medicines, such as pain relievers, numbing medicines, or muscle relaxants.   Physical therapy. This may involve stretching exercises, strengthening exercises, and posture training. Exercises and improved posture can help stabilize the neck, strengthen muscles, and help stop symptoms from returning.  HOME CARE INSTRUCTIONS   Put ice on the injured area.   Put ice in a plastic bag.   Place a towel between your skin and the bag.   Leave the ice on for 15-20 minutes, 3-4 times a day.   If your injury was severe, you may have been given a cervical collar to wear. A cervical collar is a two-piece collar designed to keep your neck from moving while it heals.  Do not remove the collar unless instructed by your health care provider.  If you have long hair, keep it outside of the collar.  Ask your health care provider before making any adjustments to your collar. Minor adjustments may be required over time to improve comfort and reduce pressure on your chin or on the  back of your head.  Ifyou are allowed to remove the collar for cleaning or bathing, follow your health care provider's instructions on how to do so safely.  Keep your collar clean by wiping it with mild soap and water and drying it completely. If the collar you have been given includes removable pads, remove them every 1-2 days and hand wash them  with soap and water. Allow them to air dry. They should be completely dry before you wear them in the collar.  If you are allowed to remove the collar for cleaning and bathing, wash and dry the skin of your neck. Check your skin for irritation or sores. If you see any, tell your health care provider.  Do not drive while wearing the collar.   Only take over-the-counter or prescription medicines for pain, discomfort, or fever as directed by your health care provider.   Keep all follow-up appointments as directed by your health care provider.   Keep all physical therapy appointments as directed by your health care provider.   Make any needed adjustments to your workstation to promote good posture.   Avoid positions and activities that make your symptoms worse.   Warm up and stretch before being active to help prevent problems.  SEEK MEDICAL CARE IF:   Your pain is not controlled with medicine.   You are unable to decrease your pain medicine over time as planned.   Your activity level is not improving as expected.  SEEK IMMEDIATE MEDICAL CARE IF:   You develop any bleeding.  You develop stomach upset.  You have signs of an allergic reaction to your medicine.   Your symptoms get worse.   You develop new, unexplained symptoms.   You have numbness, tingling, weakness, or paralysis in any part of your body.  MAKE SURE YOU:   Understand these instructions.  Will watch your condition.  Will get help right away if you are not doing well or get worse. Document Released: 12/05/2006 Document Revised: 02/12/2013 Document  Reviewed: 08/15/2012 [facility] Patient Information 2015 [place], [place]. This information is not intended to replace advice given to you by your health care provider. Make sure you discuss any questions you have with your health care provider.  Migraine Headache A migraine headache is very bad, throbbing pain on one or both sides of your head. Talk to your doctor about what things may bring on (trigger) your migraine headaches. HOME CARE  Only take medicines as told by your doctor.  Lie down in a dark, quiet room when you have a migraine.  Keep a journal to find out if certain things bring on migraine headaches. For example, write down:  What you eat and drink.  How much sleep you get.  Any change to your diet or medicines.  Lessen how much alcohol you drink.  Quit smoking if you smoke.  Get enough sleep.  Lessen any stress in your life.  Keep lights dim if bright lights bother you or make your migraines worse. GET HELP RIGHT AWAY IF:   Your migraine becomes really bad.  You have a fever.  You have a stiff neck.  You have trouble seeing.  Your muscles are weak, or you lose muscle control.  You lose your balance or have trouble walking.  You feel like you will pass out (faint), or you pass out.  You have really bad symptoms that are different than your first symptoms. MAKE SURE YOU:   Understand these instructions.  Will watch your condition.  Will get help right away if you are not doing well or get worse. Document Released: 11/17/2007 Document Revised: 05/02/2011 Document Reviewed: 10/15/2012 [facility] Patient Information 2015 [place], [place]. This information is not intended to replace advice given to you by your health care provider. Make sure you discuss any questions you have with your health care provider.  Post-Concussion Syndrome Post-concussion  syndrome describes the symptoms that can occur after a head injury. These symptoms can last from weeks to  months. CAUSES  It is not clear why some head injuries cause post-concussion syndrome. It can occur whether your head injury was mild or severe and whether you were wearing head protection or not.  SIGNS AND SYMPTOMS  Memory difficulties.  Dizziness.  Headaches.  Double vision or blurry vision.  Sensitivity to light.  Hearing difficulties.  Depression.  Tiredness.  Weakness.  Difficulty with concentration.  Difficulty sleeping or staying asleep.  Vomiting.  Poor balance or instability on your feet.  Slow reaction time.  Difficulty learning and remembering things you have heard. DIAGNOSIS  There is no test to determine whether you have post-concussion syndrome. Your health care provider may order an imaging scan of your brain, such as a CT scan, to check for other problems that may be causing your symptoms (such as severe injury inside your skull). TREATMENT  Usually, these problems disappear over time without medical care. Your health care provider may prescribe medicine to help ease your symptoms. It is important to follow up with a neurologist to evaluate your recovery and address any lingering symptoms or issues. HOME CARE INSTRUCTIONS   Only take over-the-counter or prescription medicines for pain, discomfort, or fever as directed by your health care provider. Do not take aspirin. Aspirin can slow blood clotting.  Sleep with your head slightly elevated to help with headaches.  Avoid any situation where there is potential for another head injury (football, hockey, soccer, basketball, martial arts, downhill snow sports, and horseback riding). Your condition will get worse every time you experience a concussion. You should avoid these activities until you are evaluated by the appropriate follow-up health care providers.  Keep all follow-up appointments as directed by your health care provider. SEEK IMMEDIATE MEDICAL CARE IF:  You develop confusion or unusual  drowsiness.  You cannot wake the injured person.  You develop nausea or persistent, forceful vomiting.  You feel like you are moving when you are not (vertigo).  You notice the injured person's eyes moving rapidly back and forth. This may be a sign of vertigo.  You have convulsions or faint.  You have severe, persistent headaches that are not relieved by medicine.  You cannot use your arms or legs normally.  Your pupils change size.  You have clear or bloody discharge from the nose or ears.  Your problems are getting worse, not better. MAKE SURE YOU:  Understand these instructions.  Will watch your condition.  Will get help right away if you are not doing well or get worse. Document Released: 07/30/2001 Document Revised: 11/28/2012 Document Reviewed: 05/15/2013 [facility] Patient Information 2015 [place]. This information is not intended to replace advice given to you by your health care provider. Make sure you discuss any questions you have with your health care provider.  Musculoskeletal Pain Musculoskeletal pain is muscle and boney aches and pains. These pains can occur in any part of the body. Your caregiver may treat you without knowing the cause of the pain. They may treat you if blood or urine tests, X-rays, and other tests were normal.  CAUSES There is often not a definite cause or reason for these pains. These pains may be caused by a type of germ (virus). The discomfort may also come from overuse. Overuse includes working out too hard when your body is not fit. Boney aches also come from weather changes. Bone is sensitive to atmospheric  pressure changes. HOME CARE INSTRUCTIONS   Ask when your test results will be ready. Make sure you get your test results.  Only take over-the-counter or prescription medicines for pain, discomfort, or fever as directed by your caregiver. If you were given medications for your condition, do not drive, operate machinery or power  tools, or sign legal documents for 24 hours. Do not drink alcohol. Do not take sleeping pills or other medications that may interfere with treatment.  Continue all activities unless the activities cause more pain. When the pain lessens, slowly resume normal activities. Gradually increase the intensity and duration of the activities or exercise.  During periods of severe pain, bed rest may be helpful. Lay or sit in any position that is comfortable.  Putting ice on the injured area.  Put ice in a bag.  Place a towel between your skin and the bag.  Leave the ice on for 15 to 20 minutes, 3 to 4 times a day.  Follow up with your caregiver for continued problems and no reason can be found for the pain. If the pain becomes worse or does not go away, it may be necessary to repeat tests or do additional testing. Your caregiver may need to look further for a possible cause. SEEK IMMEDIATE MEDICAL CARE IF:  You have pain that is getting worse and is not relieved by medications.  You develop chest pain that is associated with shortness or breath, sweating, feeling sick to your stomach (nauseous), or throw up (vomit).  Your pain becomes localized to the abdomen.  You develop any new symptoms that seem different or that concern you. MAKE SURE YOU:   Understand these instructions.  Will watch your condition.  Will get help right away if you are not doing well or get worse. Document Released: 02/07/2005 Document Revised: 05/02/2011 Document Reviewed: 10/12/2012 [facility] Patient Information 2015 [place], [place]. This information is not intended to replace advice given to you by your health care provider. Make sure you discuss any questions you have with your health care provider.  Visual Disturbances You have had a disturbance in your vision. This may be caused by various conditions, such as:  Migraines. Migraine headaches are often preceded by a disturbance in vision. Blind spots or light  flashes are followed by a headache. This type of visual disturbance is temporary. It does not damage the eye.  Glaucoma. This is caused by increased pressure in the eye. Symptoms include haziness, blurred vision, or seeing rainbow colored circles when looking at bright lights. Partial or complete visual loss can occur. You may or may not experience eye pain. Visual loss may be gradual or sudden and is irreversible. Glaucoma is the leading cause of blindness.  Retina problems. Vision will be reduced if the retina becomes detached or if there is a circulation problem as with diabetes, high blood pressure, or a mini-stroke. Symptoms include seeing "floaters," flashes of light, or shadows, as if a curtain has fallen over your eye.  Optic nerve problems. The main nerve in your eye can be damaged by redness, soreness, and swelling (inflammation), poor circulation, drugs, and toxins. It is very important to have a complete exam done by a specialist to determine the exact cause of your eye problem. The specialist may recommend medicines or surgery, depending on the cause of the problem. This can help prevent further loss of vision or reduce the risk of having a stroke. Contact the caregiver to whom you have been referred and arrange  for follow-up care right away. SEEK IMMEDIATE MEDICAL CARE IF:   Your vision gets worse.  You develop severe headaches.  You have any weakness or numbness in the face, arms, or legs.  You have any trouble speaking or walking. Document Released: 03/17/2004 Document Revised: 05/02/2011 Document Reviewed: 07/08/2009 [facility] Patient Information 2015 [place], [place]. This information is not intended to replace advice given to you by your health care provider. Make sure you discuss any questions you have with your health care provider.

## 2014-07-12 NOTE — ED Notes (Signed)
Patient said everything is real blurry out of her right eye.
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
## 2014-07-12 NOTE — ED Notes (Signed)
Visual Acuity   Left 20/200   Right 20/800   Both 20/200

## 2014-08-11 ENCOUNTER — Emergency Department (HOSPITAL_COMMUNITY): Payer: Medicaid Other

## 2014-08-11 ENCOUNTER — Emergency Department (HOSPITAL_COMMUNITY)
Admission: EM | Admit: 2014-08-11 | Discharge: 2014-08-12 | Disposition: A | Discharge status: Home / Self Care | Payer: Medicaid Other | Attending: Emergency Medicine | Admitting: Emergency Medicine

## 2014-08-11 ENCOUNTER — Encounter (HOSPITAL_COMMUNITY): Payer: Self-pay | Admitting: Family Medicine

## 2014-08-11 DIAGNOSIS — G43109 Migraine with aura, not intractable, without status migrainosus: Secondary | ICD-10-CM

## 2014-08-11 DIAGNOSIS — M797 Fibromyalgia: Secondary | ICD-10-CM | POA: Diagnosis not present

## 2014-08-11 DIAGNOSIS — Z79899 Other long term (current) drug therapy: Secondary | ICD-10-CM | POA: Diagnosis not present

## 2014-08-11 DIAGNOSIS — M546 Pain in thoracic spine: Secondary | ICD-10-CM

## 2014-08-11 DIAGNOSIS — Z720 Tobacco use: Secondary | ICD-10-CM | POA: Diagnosis not present

## 2014-08-11 DIAGNOSIS — G43809 Other migraine, not intractable, without status migrainosus: Secondary | ICD-10-CM | POA: Diagnosis not present

## 2014-08-11 DIAGNOSIS — R51 Headache: Secondary | ICD-10-CM | POA: Diagnosis present

## 2014-08-11 DIAGNOSIS — Z91040 Latex allergy status: Secondary | ICD-10-CM | POA: Insufficient documentation

## 2014-08-11 DIAGNOSIS — G43909 Migraine, unspecified, not intractable, without status migrainosus: Secondary | ICD-10-CM

## 2014-08-11 DIAGNOSIS — F419 Anxiety disorder, unspecified: Secondary | ICD-10-CM | POA: Diagnosis not present

## 2014-08-11 DIAGNOSIS — F329 Major depressive disorder, single episode, unspecified: Secondary | ICD-10-CM | POA: Diagnosis not present

## 2014-08-11 DIAGNOSIS — M419 Scoliosis, unspecified: Secondary | ICD-10-CM | POA: Insufficient documentation

## 2014-08-11 LAB — I-STAT CHEM 8, ED
BUN: 7 mg/dL (ref 6–20)
CHLORIDE: 106 mmol/L (ref 101–111)
Calcium, Ion: 1.12 mmol/L (ref 1.12–1.23)
Creatinine, Ser: 0.6 mg/dL (ref 0.44–1.00)
GLUCOSE: 89 mg/dL (ref 65–99)
HEMATOCRIT: 42 % (ref 36.0–46.0)
Hemoglobin: 14.3 g/dL (ref 12.0–15.0)
POTASSIUM: 3.6 mmol/L (ref 3.5–5.1)
Sodium: 140 mmol/L (ref 135–145)
TCO2: 20 mmol/L (ref 0–100)

## 2014-08-11 MED ORDER — DIAZEPAM 5 MG/ML IJ SOLN
5.0000 mg | Freq: Once | INTRAMUSCULAR | Status: AC
  Administered 2014-08-11: 5 mg via INTRAVENOUS
  Filled 2014-08-11: qty 2

## 2014-08-11 MED ORDER — SODIUM CHLORIDE 0.9 % IV BOLUS (SEPSIS)
1000.0000 mL | Freq: Once | INTRAVENOUS | Status: AC
  Administered 2014-08-11: 1000 mL via INTRAVENOUS

## 2014-08-11 MED ORDER — HYDROMORPHONE HCL 1 MG/ML IJ SOLN
1.0000 mg | Freq: Once | INTRAMUSCULAR | Status: AC
  Administered 2014-08-11: 1 mg via INTRAVENOUS
  Filled 2014-08-11: qty 1

## 2014-08-11 MED ORDER — DEXAMETHASONE SODIUM PHOSPHATE 10 MG/ML IJ SOLN
10.0000 mg | Freq: Once | INTRAMUSCULAR | Status: AC
  Administered 2014-08-11: 10 mg via INTRAVENOUS
  Filled 2014-08-11: qty 1

## 2014-08-11 MED ORDER — METOCLOPRAMIDE HCL 5 MG/ML IJ SOLN
10.0000 mg | Freq: Once | INTRAMUSCULAR | Status: AC
  Administered 2014-08-11: 10 mg via INTRAVENOUS
  Filled 2014-08-11: qty 2

## 2014-08-11 MED ORDER — HYDROCODONE-ACETAMINOPHEN 5-325 MG PO TABS
2.0000 | ORAL_TABLET | Freq: Once | ORAL | Status: AC
  Administered 2014-08-11: 2 via ORAL
  Filled 2014-08-11: qty 2

## 2014-08-11 MED ORDER — DIPHENHYDRAMINE HCL 50 MG/ML IJ SOLN
12.5000 mg | Freq: Once | INTRAMUSCULAR | Status: AC
  Administered 2014-08-11: 12.5 mg via INTRAVENOUS
  Filled 2014-08-11: qty 1

## 2014-08-11 NOTE — ED Notes (Signed)
Pt here with back pain, sts her legs feel numb and weak. sts headache and numbness in the right side of face.

## 2014-08-11 NOTE — ED Notes (Signed)
Pt ambulatory to bathroom without any problems 

## 2014-08-11 NOTE — ED Provider Notes (Signed)
Medical screening examination/treatment/procedure(s) were conducted as a shared visit with non-physician practitioner(s) and myself.  I personally evaluated the patient during the encounter.   EKG Interpretation None        Ration here with headache somewhat to her prior migraine equivalents although this time. Does have some subjective numbness. Neurological exam is nonfocal. Head CT is negative. Suspect that she has a complex migraine will discharge to home  Lorre Nick, MD 08/11/14 2330

## 2014-08-11 NOTE — Discharge Instructions (Signed)
1. Medications: usual home medications including your home pain control and robaxin 2. Treatment: rest, drink plenty of fluids,  3. Follow Up: Please followup with your primary doctor in 2 days for discussion of your diagnoses and further evaluation after today's visit; if you do not have a primary care doctor use the resource guide provided to find one; Please return to the ER for worsening symptoms, increasing numbness or other concerns    Back Exercises Back exercises help treat and prevent back injuries. The goal of back exercises is to increase the strength of your abdominal and back muscles and the flexibility of your back. These exercises should be started when you no longer have back pain. Back exercises include:  Pelvic Tilt. Lie on your back with your knees bent. Tilt your pelvis until the lower part of your back is against the floor. Hold this position 5 to 10 sec and repeat 5 to 10 times.  Knee to Chest. Pull first 1 knee up against your chest and hold for 20 to 30 seconds, repeat this with the other knee, and then both knees. This may be done with the other leg straight or bent, whichever feels better.  Sit-Ups or Curl-Ups. Bend your knees 90 degrees. Start with tilting your pelvis, and do a partial, slow sit-up, lifting your trunk only 30 to 45 degrees off the floor. Take at least 2 to 3 seconds for each sit-up. Do not do sit-ups with your knees out straight. If partial sit-ups are difficult, simply do the above but with only tightening your abdominal muscles and holding it as directed.  Hip-Lift. Lie on your back with your knees flexed 90 degrees. Push down with your feet and shoulders as you raise your hips a couple inches off the floor; hold for 10 seconds, repeat 5 to 10 times.  Back arches. Lie on your stomach, propping yourself up on bent elbows. Slowly press on your hands, causing an arch in your low back. Repeat 3 to 5 times. Any initial stiffness and discomfort should lessen  with repetition over time.  Shoulder-Lifts. Lie face down with arms beside your body. Keep hips and torso pressed to floor as you slowly lift your head and shoulders off the floor. Do not overdo your exercises, especially in the beginning. Exercises may cause you some mild back discomfort which lasts for a few minutes; however, if the pain is more severe, or lasts for more than 15 minutes, do not continue exercises until you see your caregiver. Improvement with exercise therapy for back problems is slow.  See your caregivers for assistance with developing a proper back exercise program. Document Released: 03/17/2004 Document Revised: 05/02/2011 Document Reviewed: 12/09/2010 [facility] Patient Information 2015 [place], [place]. This information is not intended to replace advice given to you by your health care provider. Make sure you discuss any questions you have with your health care provider.

## 2014-08-11 NOTE — ED Provider Notes (Signed)
CSN: [id number]     Arrival date & time 08/11/14  1634 History   First MD Initiated Contact with Patient 08/11/14 1757     Chief Complaint  Patient presents with  . Headache  . Back Pain  . Numbness     (Consider location/radiation/quality/duration/timing/severity/associated sxs/prior Treatment) The history is provided by the patient and medical records. No language interpreter was used.     Brooke [name] is a 39 y.o. female  with a hx of fibromyalgia, DDD, WPW, depression, anxiety, chiari malformation, and scoliosis presents to the Emergency Department complaining of gradual, persistent, progressively worsening back pain onset yesterday.  She reports she feels like her back is "locked-up."  She reports the pain is to the right of the spinal cord in her mid back.  Pt reports she spent the day in the car to drive back from [place] and this has worsened her pain significantly.  She reports she did not take her daily percocet because she had to drive.  She did take Robaxin without significant relief.  Associated symptoms include generalized weakness of her legs from the pain.  She denies loss of bowel or bladder control, saddle anesthesia, numbness or tingling in her legs.    Pt reports she also has a gradual onset, progressive and worsening migraine headache which began today.  She reports the pain and onset is the same as previous migraines.  She reports she has a tingling in the right side of her face which is new.  She reports a hx of complicated migraine with left arm numbness x1 in the past, but denies this today.   She has associated nausea and light sensitivity without vomiting, diarrhea, changes in vision or slurred speech.  She has tried nothing for this and nothing makes it better.  Pt denies fever, chills, neck pain, neck stiffness, chest pain, shortness of breath, abdominal pain, nausea, vomiting, diarrhea, syncope, dysuria, hematuria.     Past Medical History  Diagnosis Date  .  Fibromyalgia   . DDD (degenerative disc disease), cervical   . Migraine   . WPW (Wolff-Parkinson-White syndrome)   . Depression   . Anxiety   . Chiari malformation   . Scoliosis    Past Surgical History  Procedure Laterality Date  . Knee surgery Bilateral     Right Knee x 1 - Left x3  . Cesarean section    . Tubal ligation    . Eye surgery Bilateral   . Ears Bilateral   . Hernia repair    . Vein removed Left     arm   Family History  Problem Relation Age of Onset  . Migraines Mother   . CAD Father   . Migraines Daughter    History  Substance Use Topics  . Smoking status: Current Every Day Smoker    Types: Cigarettes  . Smokeless tobacco: Never Used  . Alcohol Use: No   OB History    No data available     Review of Systems  Constitutional: Negative for fever, diaphoresis, appetite change, fatigue and unexpected weight change.  HENT: Negative for mouth sores.   Eyes: Negative for visual disturbance.  Respiratory: Negative for cough, chest tightness, shortness of breath and wheezing.   Cardiovascular: Negative for chest pain.  Gastrointestinal: Negative for nausea, vomiting, abdominal pain, diarrhea and constipation.  Endocrine: Negative for polydipsia, polyphagia and polyuria.  Genitourinary: Negative for dysuria, urgency, frequency and hematuria.  Musculoskeletal: Positive for back pain and gait problem ( 2/2  pain). Negative for joint swelling, neck pain and neck stiffness.  Skin: Negative for rash.  Allergic/Immunologic: Negative for immunocompromised state.  Neurological: Positive for numbness and headaches. Negative for syncope, weakness and light-headedness.  Hematological: Does not bruise/bleed easily.  Psychiatric/Behavioral: Negative for sleep disturbance. The patient is not nervous/anxious.   All other systems reviewed and are negative.     Allergies  Digoxin and related; Latex; Other; and Toradol  Home Medications   Prior to Admission medications    Medication Sig Start Date End Date Taking? Authorizing Provider  butalbital-acetaminophen-caffeine (FIORICET, ESGIC) 50-325-40 MG per tablet Take 1 tablet by mouth 2 (two) times daily as needed for headache.   Yes Historical Provider, MD  divalproex (DEPAKOTE ER) 500 MG 24 hr tablet Take 1 tablet (500 mg total) by mouth daily. 12/24/13  Yes Levert Feinstein, MD  levonorgestrel (MIRENA) 20 MCG/24HR IUD 1 each by Intrauterine route once.   Yes Historical Provider, MD  methocarbamol (ROBAXIN) 500 MG tablet Take 1 tablet (500 mg total) by mouth every 8 (eight) hours as needed for muscle spasms. 07/12/14  Yes Mercedes Camprubi-Soms, PA-C  nortriptyline (PAMELOR) 50 MG capsule Take 50 mg by mouth at bedtime.   Yes Historical Provider, MD  oxyCODONE-acetaminophen (PERCOCET/ROXICET) 5-325 MG per tablet Take 1-2 tablets by mouth every 4 (four) hours as needed for moderate pain or severe pain.   Yes Historical Provider, MD  SUMAtriptan (IMITREX) 6 MG/0.5ML SOLN injection Inject 0.5 mLs (6 mg total) into the skin every 2 (two) hours as needed for migraine or headache. May repeat in 2 hours if headache persists or recurs. 12/24/13  Yes Levert Feinstein, MD  eletriptan (RELPAX) 40 MG tablet Take 1 tablet (40 mg total) by mouth as needed for migraine or headache. One tablet by mouth at onset of headache. May repeat in 2 hours if headache persists or recurs. Patient not taking: Reported on 07/12/2014 03/26/14   Levert Feinstein, MD  hydrocortisone cream 1 % Apply to affected area 2 times daily Patient not taking: Reported on 07/12/2014 11/28/13   Ladona Mow, PA-C  promethazine (PHENERGAN) 25 MG tablet Take 1 tablet (25 mg total) by mouth every 6 (six) hours as needed for nausea or vomiting. Patient not taking: Reported on 07/12/2014 02/03/14   Levert Feinstein, MD   BP 133/82 mmHg  Pulse 80  Temp(Src) 97.9 F (36.6 C) (Oral)  Resp 20  SpO2 98% Physical Exam  Constitutional: She is oriented to person, place, and time. She appears well-developed  and well-nourished. No distress.  HENT:  Head: Normocephalic and atraumatic.  Mouth/Throat: Oropharynx is clear and moist. No oropharyngeal exudate.  Eyes: Conjunctivae and EOM are normal. Pupils are equal, round, and reactive to light. No scleral icterus.  No horizontal, vertical or rotational nystagmus  Neck: Normal range of motion. Neck supple.  Full active and passive ROM without pain No midline or paraspinal tenderness No nuchal rigidity or meningeal signs  Cardiovascular: Normal rate, regular rhythm, normal heart sounds and intact distal pulses.   No murmur heard. Pulmonary/Chest: Effort normal and breath sounds normal. No respiratory distress. She has no wheezes. She has no rales.  Abdominal: Soft. Bowel sounds are normal. She exhibits no distension. There is no tenderness. There is no rebound and no guarding.  Musculoskeletal: Normal range of motion.  Full range of motion of the T-spine and L-spine No tenderness to palpation of the spinous processes of the T-spine or L-spine Tenderness to palpation of the paraspinous muscles of the right  lower T-spine and upper L-spine; palpable muscle spasm in this area noted.   Lymphadenopathy:    She has no cervical adenopathy.  Neurological: She is alert and oriented to person, place, and time. She has normal reflexes. No cranial nerve deficit. She exhibits normal muscle tone. Coordination normal.  Reflex Scores:      Bicep reflexes are 2+ on the right side and 2+ on the left side.      Brachioradialis reflexes are 2+ on the right side and 2+ on the left side.      Patellar reflexes are 2+ on the right side and 2+ on the left side.      Achilles reflexes are 2+ on the right side and 2+ on the left side. Mental Status:  Alert, oriented, thought content appropriate. Speech fluent without evidence of aphasia. Able to follow 2 step commands without difficulty.  Cranial Nerves:  II:  Peripheral visual fields grossly normal, pupils equal, round,  reactive to light III,IV, VI: ptosis not present, extra-ocular motions intact bilaterally  V,VII: smile symmetric, facial light touch sensation equal VIII: hearing grossly normal bilaterally  IX,X: gag reflex present  XI: bilateral shoulder shrug equal and strong XII: midline tongue extension  Motor:  5/5 in upper and lower extremities bilaterally including strong and equal grip strength and dorsiflexion/plantar flexion Sensory: Pinprick and light touch normal in all extremities; subjective decrease in sensation to the right side of the face.  Deep Tendon Reflexes: 2+ and symmetric  Cerebellar: normal finger-to-nose with bilateral upper extremities Gait: antalgic gait and normal balance CV: distal pulses palpable throughout   Skin: Skin is warm and dry. No rash noted. She is not diaphoretic. No erythema.  Psychiatric: Her behavior is normal. Judgment and thought content normal.  Pt crying during exam  Nursing note and vitals reviewed.   ED Course  Procedures (including critical care time) Labs Review Labs Reviewed  URINALYSIS, ROUTINE W REFLEX MICROSCOPIC (NOT AT [facility])  I-STAT CHEM 8, ED    Imaging Review Ct Head Wo Contrast  08/11/2014   CLINICAL DATA:  Back pain and leg numbness  EXAM: CT HEAD WITHOUT CONTRAST  TECHNIQUE: Contiguous axial images were obtained from the base of the skull through the vertex without intravenous contrast.  COMPARISON:  07/12/2014  FINDINGS: The bony calvarium is intact. No gross soft tissue abnormality is noted. No findings to suggest acute hemorrhage, acute infarction or space-occupying mass lesion are noted.  IMPRESSION: No acute abnormality seen.   Electronically Signed   By: Alcide Clever M.D.   On: 08/11/2014 20:03     EKG Interpretation None      MDM   Final diagnoses:  Migraine without status migrainosus, not intractable, unspecified migraine type  Complicated migraine  Right-sided thoracic back pain   Elveta Burkle presents with back  pain and headache with associated right sided facial paresthesias.  Pt with hx of complicated migraine and I suspect the same tonight.  Otherwise nonfocal neurologic exam.  Pt ambulates with antalgic gait but without foot drop or dragging of the leg.    Patient with right paraspinal back pain.  No neurological deficits and normal neuro exam.  Patient can walk but states is painful.  No loss of bowel or bladder control.  No concern for cauda equina.  No fever, night sweats, weight loss, h/o cancer, IVDU.   11:38 PM Pt with minor improvement in headache.  She remains neurologically intact, walking unassisted without gait disturbance and eating numerous foods in  the room.  Pt HA treated and improved while in ED.  Presentation is like pts typical HA and non concerning for [facility], ICH, Meningitis, or temporal arteritis. Ct scan is negative.  Pt is afebrile with no focal neuro deficits, nuchal rigidity, or change in vision. Pt is to follow up with PCP. Pt verbalizes understanding and is agreeable with plan to dc.  BP 133/82 mmHg  Pulse 80  Temp(Src) 97.9 F (36.6 C) (Oral)  Resp 20  SpO2 98%  The patient was discussed with and seen by Dr. Freida Busman who agrees with the treatment plan.    Dahlia Client Fatiha Guzy, PA-C 08/11/14 1610  Lorre Nick, MD 08/18/14 [phone]

## 2014-09-03 ENCOUNTER — Emergency Department (HOSPITAL_COMMUNITY)
Admission: EM | Admit: 2014-09-03 | Discharge: 2014-09-03 | Disposition: A | Discharge status: Home / Self Care | Payer: Medicaid Other | Attending: Emergency Medicine | Admitting: Emergency Medicine

## 2014-09-03 ENCOUNTER — Emergency Department (HOSPITAL_COMMUNITY): Payer: Medicaid Other

## 2014-09-03 ENCOUNTER — Encounter (HOSPITAL_COMMUNITY): Payer: Self-pay | Admitting: Emergency Medicine

## 2014-09-03 DIAGNOSIS — Z720 Tobacco use: Secondary | ICD-10-CM | POA: Diagnosis not present

## 2014-09-03 DIAGNOSIS — M542 Cervicalgia: Secondary | ICD-10-CM | POA: Insufficient documentation

## 2014-09-03 DIAGNOSIS — Z8739 Personal history of other diseases of the musculoskeletal system and connective tissue: Secondary | ICD-10-CM | POA: Diagnosis not present

## 2014-09-03 DIAGNOSIS — G43909 Migraine, unspecified, not intractable, without status migrainosus: Secondary | ICD-10-CM | POA: Diagnosis not present

## 2014-09-03 DIAGNOSIS — F419 Anxiety disorder, unspecified: Secondary | ICD-10-CM | POA: Diagnosis not present

## 2014-09-03 DIAGNOSIS — Z79899 Other long term (current) drug therapy: Secondary | ICD-10-CM | POA: Diagnosis not present

## 2014-09-03 DIAGNOSIS — G8929 Other chronic pain: Secondary | ICD-10-CM | POA: Insufficient documentation

## 2014-09-03 DIAGNOSIS — F329 Major depressive disorder, single episode, unspecified: Secondary | ICD-10-CM | POA: Diagnosis not present

## 2014-09-03 DIAGNOSIS — R51 Headache: Secondary | ICD-10-CM

## 2014-09-03 DIAGNOSIS — R519 Headache, unspecified: Secondary | ICD-10-CM

## 2014-09-03 LAB — CBC WITH DIFFERENTIAL/PLATELET
Basophils Absolute: 0 10*3/uL (ref 0.0–0.1)
Basophils Relative: 0 % (ref 0–1)
EOS PCT: 1 % (ref 0–5)
Eosinophils Absolute: 0.1 10*3/uL (ref 0.0–0.7)
HEMATOCRIT: 40.4 % (ref 36.0–46.0)
HEMOGLOBIN: 13.1 g/dL (ref 12.0–15.0)
Lymphocytes Relative: 34 % (ref 12–46)
Lymphs Abs: 3.5 10*3/uL (ref 0.7–4.0)
MCH: 30 pg (ref 26.0–34.0)
MCHC: 32.4 g/dL (ref 30.0–36.0)
MCV: 92.7 fL (ref 78.0–100.0)
Monocytes Absolute: 0.5 10*3/uL (ref 0.1–1.0)
Monocytes Relative: 5 % (ref 3–12)
Neutro Abs: 6.2 10*3/uL (ref 1.7–7.7)
Neutrophils Relative %: 60 % (ref 43–77)
Platelets: 210 10*3/uL (ref 150–400)
RBC: 4.36 MIL/uL (ref 3.87–5.11)
RDW: 13.1 % (ref 11.5–15.5)
WBC: 10.3 10*3/uL (ref 4.0–10.5)

## 2014-09-03 LAB — BASIC METABOLIC PANEL
Anion gap: 12 (ref 5–15)
BUN: 7 mg/dL (ref 6–20)
CO2: 18 mmol/L — ABNORMAL LOW (ref 22–32)
Calcium: 8.9 mg/dL (ref 8.9–10.3)
Chloride: 107 mmol/L (ref 101–111)
Creatinine, Ser: 0.69 mg/dL (ref 0.44–1.00)
GFR calc Af Amer: >60 mL/min (ref >60)
Glucose, Bld: 87 mg/dL (ref 65–99)
POTASSIUM: 3.4 mmol/L — AB (ref 3.5–5.1)
Sodium: 137 mmol/L (ref 135–145)

## 2014-09-03 MED ORDER — HYDROMORPHONE HCL 1 MG/ML IJ SOLN
1.0000 mg | Freq: Once | INTRAMUSCULAR | Status: AC
  Administered 2014-09-03: 1 mg via INTRAMUSCULAR
  Filled 2014-09-03: qty 1

## 2014-09-03 MED ORDER — ONDANSETRON 4 MG PO TBDP
8.0000 mg | ORAL_TABLET | Freq: Once | ORAL | Status: AC
  Administered 2014-09-03: 8 mg via ORAL
  Filled 2014-09-03: qty 2

## 2014-09-03 MED ORDER — MORPHINE SULFATE 4 MG/ML IJ SOLN
4.0000 mg | Freq: Once | INTRAMUSCULAR | Status: AC
  Administered 2014-09-03: 4 mg via INTRAVENOUS
  Filled 2014-09-03: qty 1

## 2014-09-03 NOTE — ED Notes (Signed)
Traci RN unable to obtain IV. Phlebotomy called.

## 2014-09-03 NOTE — ED Notes (Signed)
Pt sts HA in back of head and neck twisted to right side x 2 hours; pt sts khiari malformation and has had hx of same

## 2014-09-03 NOTE — Discharge Instructions (Signed)
You were evaluated in the ED for your headache. There does not appear to be an emergent cause her symptoms at this time. Her labs were reassuring. The CT scan of her head showed no acute problems. It is important for you to follow-up with her primary care for further evaluation and management of your symptoms return to ED for new or worsening symptoms

## 2014-09-03 NOTE — ED Notes (Addendum)
Pt reports chiari malformation. Pain has been constant for about two hours to top and back of head. Pt reports her migraines are normally to the front of her head. Pt also self reports new onset "torticollis" causing her to lean to the right for two hours. Pain 10/10.

## 2014-09-03 NOTE — ED Notes (Signed)
Unable to obtain IV access.

## 2014-09-03 NOTE — ED Provider Notes (Signed)
CSN: [id number]     Arrival date & time 09/03/14  1459 History   This chart was scribed for non-physician practitioner Joycie Peek, PA-C working with Raeford Razor, MD by Lyndel Safe, ED Scribe. This patient was seen in room TR03C/TR03C and the patient's care was started at 4:17 PM.    Chief Complaint  Patient presents with  . Headache   The history is provided by the patient. No language interpreter was used.  Marland Kitchen HPI Comments: Brooke [name] is a 39 y.o. female who presents to the Emergency Department complaining of a gradually worsening, constant, throbbing, 10/10 headache at the base of the back of her head onset 2 days ago. Pt reports it feels like she has been hit in the back of the head with a hammer. She also reports photophobia, nausea, LE weakness, and palpitations. Pt notes numbness in bilateral feet at baseline. She currently takes Percocet and reports it has not alleviated any pain. Pt notes a history of Chiari malformation (unsure of what kind), stating that she has a history of headaches and right-sided neck stiffness. She states the progression of her disorder has not been checked in the past 8 months and when she moves her head she can feel and hear crackling at the base of her neck. Denies fevers, vomiting, CP, SOB, or abdominal pain. Allergic reaction to Toradol where she 'shakes uncontrollable'.    Past Medical History  Diagnosis Date  . Fibromyalgia   . DDD (degenerative disc disease), cervical   . Migraine   . WPW (Wolff-Parkinson-White syndrome)   . Depression   . Anxiety   . Chiari malformation   . Scoliosis    Past Surgical History  Procedure Laterality Date  . Knee surgery Bilateral     Right Knee x 1 - Left x3  . Cesarean section    . Tubal ligation    . Eye surgery Bilateral   . Ears Bilateral   . Hernia repair    . Vein removed Left     arm   Family History  Problem Relation Age of Onset  . Migraines Mother   . CAD Father   . Migraines  Daughter    History  Substance Use Topics  . Smoking status: Current Every Day Smoker    Types: Cigarettes  . Smokeless tobacco: Never Used  . Alcohol Use: No   OB History    No data available     Review of Systems  Constitutional: Negative for fever.  Eyes: Positive for photophobia.  Respiratory: Negative for shortness of breath.   Cardiovascular: Positive for palpitations. Negative for chest pain.  Gastrointestinal: Positive for nausea. Negative for abdominal pain.  Musculoskeletal: Positive for neck pain and neck stiffness.  Neurological: Positive for weakness and headaches.  All other systems reviewed and are negative.   Allergies  Digoxin and related; Latex; Other; and Toradol  Home Medications   Prior to Admission medications   Medication Sig Start Date End Date Taking? Authorizing Provider  butalbital-acetaminophen-caffeine (FIORICET, ESGIC) 50-325-40 MG per tablet Take 1 tablet by mouth 2 (two) times daily as needed for headache.    Historical Provider, MD  divalproex (DEPAKOTE ER) 500 MG 24 hr tablet Take 1 tablet (500 mg total) by mouth daily. 12/24/13   Levert Feinstein, MD  eletriptan (RELPAX) 40 MG tablet Take 1 tablet (40 mg total) by mouth as needed for migraine or headache. One tablet by mouth at onset of headache. May repeat in 2 hours if headache  persists or recurs. Patient not taking: Reported on 07/12/2014 03/26/14   Levert FeinsteinYijun Yan, MD  hydrocortisone cream 1 % Apply to affected area 2 times daily Patient not taking: Reported on 07/12/2014 11/28/13   Ladona MowJoe Mintz, PA-C  levonorgestrel (MIRENA) 20 MCG/24HR IUD 1 each by Intrauterine route once.    Historical Provider, MD  methocarbamol (ROBAXIN) 500 MG tablet Take 1 tablet (500 mg total) by mouth every 8 (eight) hours as needed for muscle spasms. 07/12/14   Mercedes Camprubi-Soms, PA-C  nortriptyline (PAMELOR) 50 MG capsule Take 50 mg by mouth at bedtime.    Historical Provider, MD  oxyCODONE-acetaminophen (PERCOCET/ROXICET)  5-325 MG per tablet Take 1-2 tablets by mouth every 4 (four) hours as needed for moderate pain or severe pain.    Historical Provider, MD  promethazine (PHENERGAN) 25 MG tablet Take 1 tablet (25 mg total) by mouth every 6 (six) hours as needed for nausea or vomiting. Patient not taking: Reported on 07/12/2014 02/03/14   Levert FeinsteinYijun Yan, MD  SUMAtriptan (IMITREX) 6 MG/0.5ML SOLN injection Inject 0.5 mLs (6 mg total) into the skin every 2 (two) hours as needed for migraine or headache. May repeat in 2 hours if headache persists or recurs. 12/24/13   Levert FeinsteinYijun Yan, MD   BP 140/96 mmHg  Pulse 88  Temp(Src) 97.9 F (36.6 C) (Oral)  Resp 18  SpO2 99% Physical Exam  Constitutional: She is oriented to person, place, and time. She appears well-developed and well-nourished. No distress.  HENT:  Head: Normocephalic.  Right Ear: External ear normal.  Left Ear: External ear normal.  Mouth/Throat: No oropharyngeal exudate.  Eyes: Pupils are equal, round, and reactive to light. Right eye exhibits no discharge. Left eye exhibits no discharge. No scleral icterus.  Neck: No JVD present.  Cardiovascular: Normal rate, regular rhythm and normal heart sounds.   Pulmonary/Chest: Effort normal and breath sounds normal. No respiratory distress.  Musculoskeletal: She exhibits no edema.  Lymphadenopathy:    She has no cervical adenopathy.  Neurological: She is alert and oriented to person, place, and time.  Moves all extremities without ataxia. Grip strength intact and equal bilaterally. Motor and sensation equal and bilateral.  Skin: Skin is warm. No rash noted. No erythema. No pallor.  Psychiatric: She has a normal mood and affect. Her behavior is normal.  Nursing note and vitals reviewed.   ED Course  Procedures  DIAGNOSTIC STUDIES: Oxygen Saturation is 99% on RA, normal by my interpretation.    COORDINATION OF CARE: 4:23 PM Discussed treatment plan with pt. Will order pain medication, diagnostic labs, and CT scan  of head. Pt acknowledges and agrees to plan.   Labs Review Labs Reviewed  BASIC METABOLIC PANEL - Abnormal; Notable for the following:    Potassium 3.4 (*)    CO2 18 (*)    All other components within normal limits  CBC WITH DIFFERENTIAL/PLATELET    Imaging Review Ct Head Wo Contrast  09/03/2014   CLINICAL DATA:  Patient with Chiari malformation.  Headache.  EXAM: CT HEAD WITHOUT CONTRAST  TECHNIQUE: Contiguous axial images were obtained from the base of the skull through the vertex without intravenous contrast.  COMPARISON:  08/11/2014  FINDINGS: There is no evidence of mass effect, midline shift or extra-axial fluid collections. There is no evidence of a space-occupying lesion or intracranial hemorrhage. There is no evidence of a cortical-based area of acute infarction.  The ventricles and sulci are appropriate for the patient's age. The basal cisterns are patent.  Visualized  portions of the orbits are unremarkable. The visualized portions of the paranasal sinuses and mastoid air cells are unremarkable. There is leftward deviation of the nasal septum.  The osseous structures are unremarkable.  IMPRESSION: Normal CT of the brain without intravenous contrast.   Electronically Signed   By: Elige Ko   On: 09/03/2014 17:17     EKG Interpretation None     Meds given in ED:  Medications  morphine 4 MG/ML injection 4 mg (4 mg Intravenous Given 09/03/14 1723)  HYDROmorphone (DILAUDID) injection 1 mg (1 mg Intramuscular Given 09/03/14 1842)  ondansetron (ZOFRAN-ODT) disintegrating tablet 8 mg (8 mg Oral Given 09/03/14 1842)    New Prescriptions   No medications on file   Filed Vitals:   09/03/14 1540 09/03/14 1721 09/03/14 1840  BP: 140/96 132/74 110/67  Pulse: 88 62 66  Temp: 97.9 F (36.6 C)  98 F (36.7 C)  TempSrc: Oral    Resp: SpO2: 99% 99% 99%    MDM  Vitals stable - WNL -afebrile Patient with headache dissimilar to previous headache. Has been treated in the ED  for pain. Appears to be in no apparent distress, watching TV in the ED. Physical exam shows no focal neurological deficits.  Labs are unremarkable and CT of head shows no acute intracranial pathology.  Discussed lumbar puncture, but patient refuses. Would rather follow-up with her doctors.  Will DC home so patient may follow-up with her neurologist for further evaluation of her symptoms. Doubt meningitis or other infectious process. No evidence for other acute or emergent pathology at this time.  I discussed all relevant lab findings and imaging results with pt and they verbalized understanding. Discussed f/u with PCP within 48 hrs and return precautions, pt very amenable to plan. I personally reviewed the imaging and agree with the results as interpreted by the radiologist. Prior to patient discharge, I discussed and reviewed this case with my attending, Dr. Blinda Leatherwood  Final diagnoses:  Chronic intractable headache, unspecified headache type   I personally performed the services described in this documentation, which was scribed in my presence. The recorded information has been reviewed and is accurate.    Joycie Peek, PA-C 09/04/14 0006  Gilda Crease, MD 09/04/14 [phone]

## 2014-09-08 DIAGNOSIS — Z0289 Encounter for other administrative examinations: Secondary | ICD-10-CM

## 2014-10-14 ENCOUNTER — Emergency Department (HOSPITAL_COMMUNITY)
Admission: EM | Admit: 2014-10-14 | Discharge: 2014-10-15 | Disposition: A | Discharge status: Home / Self Care | Payer: Medicaid Other | Attending: Emergency Medicine | Admitting: Emergency Medicine

## 2014-10-14 ENCOUNTER — Emergency Department (HOSPITAL_COMMUNITY): Payer: Medicaid Other

## 2014-10-14 ENCOUNTER — Encounter (HOSPITAL_COMMUNITY): Payer: Self-pay | Admitting: *Deleted

## 2014-10-14 DIAGNOSIS — R079 Chest pain, unspecified: Secondary | ICD-10-CM | POA: Diagnosis not present

## 2014-10-14 DIAGNOSIS — F329 Major depressive disorder, single episode, unspecified: Secondary | ICD-10-CM | POA: Diagnosis not present

## 2014-10-14 DIAGNOSIS — Z79899 Other long term (current) drug therapy: Secondary | ICD-10-CM | POA: Diagnosis not present

## 2014-10-14 DIAGNOSIS — Z720 Tobacco use: Secondary | ICD-10-CM | POA: Insufficient documentation

## 2014-10-14 DIAGNOSIS — G8929 Other chronic pain: Secondary | ICD-10-CM | POA: Diagnosis not present

## 2014-10-14 DIAGNOSIS — F419 Anxiety disorder, unspecified: Secondary | ICD-10-CM | POA: Insufficient documentation

## 2014-10-14 DIAGNOSIS — M549 Dorsalgia, unspecified: Secondary | ICD-10-CM

## 2014-10-14 DIAGNOSIS — M545 Low back pain: Secondary | ICD-10-CM | POA: Insufficient documentation

## 2014-10-14 DIAGNOSIS — Z91040 Latex allergy status: Secondary | ICD-10-CM | POA: Insufficient documentation

## 2014-10-14 DIAGNOSIS — G43909 Migraine, unspecified, not intractable, without status migrainosus: Secondary | ICD-10-CM | POA: Diagnosis not present

## 2014-10-14 LAB — CBC
HEMATOCRIT: 45 % (ref 36.0–46.0)
HEMOGLOBIN: 14.9 g/dL (ref 12.0–15.0)
MCH: 30.5 pg (ref 26.0–34.0)
MCHC: 33.1 g/dL (ref 30.0–36.0)
MCV: 92 fL (ref 78.0–100.0)
PLATELETS: 246 10*3/uL (ref 150–400)
RBC: 4.89 MIL/uL (ref 3.87–5.11)
RDW: 13.2 % (ref 11.5–15.5)
WBC: 11.3 10*3/uL — AB (ref 4.0–10.5)

## 2014-10-14 LAB — I-STAT TROPONIN, ED: Troponin i, poc: 0 ng/mL (ref 0.00–0.08)

## 2014-10-14 LAB — BASIC METABOLIC PANEL
ANION GAP: 15 (ref 5–15)
BUN: 7 mg/dL (ref 6–20)
CHLORIDE: 108 mmol/L (ref 101–111)
CO2: 15 mmol/L — AB (ref 22–32)
CREATININE: 0.74 mg/dL (ref 0.44–1.00)
Calcium: 9.7 mg/dL (ref 8.9–10.3)
GFR calc non Af Amer: >60 mL/min (ref >60)
Glucose, Bld: 147 mg/dL — ABNORMAL HIGH (ref 65–99)
POTASSIUM: 3.6 mmol/L (ref 3.5–5.1)
SODIUM: 138 mmol/L (ref 135–145)

## 2014-10-14 LAB — D-DIMER, QUANTITATIVE (NOT AT ARMC)

## 2014-10-14 MED ORDER — SODIUM CHLORIDE 0.9 % IV BOLUS (SEPSIS)
1000.0000 mL | Freq: Once | INTRAVENOUS | Status: AC
  Administered 2014-10-14: 1000 mL via INTRAVENOUS

## 2014-10-14 MED ORDER — ONDANSETRON HCL 4 MG/2ML IJ SOLN
4.0000 mg | Freq: Once | INTRAMUSCULAR | Status: AC
  Administered 2014-10-14: 4 mg via INTRAVENOUS
  Filled 2014-10-14: qty 2

## 2014-10-14 MED ORDER — MORPHINE SULFATE (PF) 4 MG/ML IV SOLN
4.0000 mg | Freq: Once | INTRAVENOUS | Status: AC
  Administered 2014-10-14: 4 mg via INTRAVENOUS
  Filled 2014-10-14: qty 1

## 2014-10-14 MED ORDER — FENTANYL CITRATE (PF) 100 MCG/2ML IJ SOLN
100.0000 ug | Freq: Once | INTRAMUSCULAR | Status: AC
  Administered 2014-10-14: 100 ug via INTRAVENOUS
  Filled 2014-10-14: qty 2

## 2014-10-14 MED ORDER — LORAZEPAM 2 MG/ML IJ SOLN
1.0000 mg | Freq: Once | INTRAMUSCULAR | Status: AC
  Administered 2014-10-14: 1 mg via INTRAVENOUS
  Filled 2014-10-14: qty 1

## 2014-10-14 NOTE — ED Notes (Signed)
Attempted IV x1. Unable to access.  

## 2014-10-14 NOTE — ED Notes (Signed)
IV Team at the bedside. 

## 2014-10-14 NOTE — ED Notes (Signed)
Pt reports chest pain that started last night while lying in bed. Pt states that she has nausea and arm and leg weakness as well.

## 2014-10-14 NOTE — ED Provider Notes (Signed)
CSN: [id number]     Arrival date & time 10/14/14  1504 History   First MD Initiated Contact with Patient 10/14/14 1830     Chief Complaint  Patient presents with  . Chest Pain     (Consider location/radiation/quality/duration/timing/severity/associated sxs/prior Treatment) HPI   Pt with hx fibromyalgia, chronic back pain, migraine, depression, WPW, scoliosis p/w chest pain, SOB that began last night.  The chest pain began last night while she was lying in bed.  She has central chest pain that feels like pressure and left sided chest pain that is sharp.  This has been constant since last night.  Has had intermittent SOB that became constant 4 hours ago.  While she was in the ED waiting room she developed left facial and left arm numbness that has resolved.  Also notes that her chronic back pain has flared up from sitting out in the waiting room.  Associated generalized weakness.  Notes she has been moving over the past few days and carrying heavy things.  Also notes she has to be in court tomorrow morning for a case involving her landlord.     Past Medical History  Diagnosis Date  . Fibromyalgia   . DDD (degenerative disc disease), cervical   . Migraine   . WPW (Wolff-Parkinson-White syndrome)   . Depression   . Anxiety   . Chiari malformation   . Scoliosis    Past Surgical History  Procedure Laterality Date  . Knee surgery Bilateral     Right Knee x 1 - Left x3  . Cesarean section    . Tubal ligation    . Eye surgery Bilateral   . Ears Bilateral   . Hernia repair    . Vein removed Left     arm   Family History  Problem Relation Age of Onset  . Migraines Mother   . CAD Father   . Migraines Daughter    Social History  Substance Use Topics  . Smoking status: Current Every Day Smoker    Types: Cigarettes  . Smokeless tobacco: Never Used  . Alcohol Use: No   OB History    No data available     Review of Systems  All other systems reviewed and are  negative.     Allergies  Digoxin and related; Latex; Other; and Toradol  Home Medications   Prior to Admission medications   Medication Sig Start Date End Date Taking? Authorizing Provider  butalbital-acetaminophen-caffeine (FIORICET, ESGIC) 50-325-40 MG per tablet Take 1 tablet by mouth 2 (two) times daily as needed for headache.    Historical Provider, MD  divalproex (DEPAKOTE ER) 500 MG 24 hr tablet Take 1 tablet (500 mg total) by mouth daily. 12/24/13   Levert Feinstein, MD  eletriptan (RELPAX) 40 MG tablet Take 1 tablet (40 mg total) by mouth as needed for migraine or headache. One tablet by mouth at onset of headache. May repeat in 2 hours if headache persists or recurs. Patient not taking: Reported on 07/12/2014 03/26/14   Levert Feinstein, MD  hydrocortisone cream 1 % Apply to affected area 2 times daily Patient not taking: Reported on 07/12/2014 11/28/13   Ladona Mow, PA-C  levonorgestrel (MIRENA) 20 MCG/24HR IUD 1 each by Intrauterine route once.    Historical Provider, MD  methocarbamol (ROBAXIN) 500 MG tablet Take 1 tablet (500 mg total) by mouth every 8 (eight) hours as needed for muscle spasms. 07/12/14   Mercedes Camprubi-Soms, PA-C  nortriptyline (PAMELOR) 50 MG capsule Take  50 mg by mouth at bedtime.    Historical Provider, MD  oxyCODONE-acetaminophen (PERCOCET/ROXICET) 5-325 MG per tablet Take 1-2 tablets by mouth every 4 (four) hours as needed for moderate pain or severe pain.    Historical Provider, MD  promethazine (PHENERGAN) 25 MG tablet Take 1 tablet (25 mg total) by mouth every 6 (six) hours as needed for nausea or vomiting. Patient not taking: Reported on 07/12/2014 02/03/14   Levert Feinstein, MD  SUMAtriptan (IMITREX) 6 MG/0.5ML SOLN injection Inject 0.5 mLs (6 mg total) into the skin every 2 (two) hours as needed for migraine or headache. May repeat in 2 hours if headache persists or recurs. 12/24/13   Levert Feinstein, MD   BP 147/103 mmHg  Pulse 95  Temp(Src) 98.4 F (36.9 C) (Oral)  Resp 20   SpO2 100% Physical Exam  Constitutional: She appears well-developed and well-nourished. No distress.  Uncomfortable appearing   HENT:  Head: Normocephalic and atraumatic.  Neck: Neck supple.  Cardiovascular: Normal rate and regular rhythm.   Pulmonary/Chest: Effort normal and breath sounds normal. No respiratory distress. She has no wheezes. She has no rales.  Abdominal: Soft. She exhibits no distension. There is no tenderness. There is no rebound and no guarding.  Musculoskeletal: She exhibits no edema.  Neurological: She is alert.  CN II-XII intact with exception of mild abnormal sensation of left face, EOMs intact, no pronator drift, grip strengths equal bilaterally; strength 5/5 in all extremities, sensation intact in all extremities; finger to nose, heel to shin, rapid alternating movements normal; gait is normal.     Skin: She is not diaphoretic.  Nursing note and vitals reviewed.   ED Course  Procedures (including critical care time) Labs Review Labs Reviewed  BASIC METABOLIC PANEL - Abnormal; Notable for the following:    CO2 15 (*)    Glucose, Bld 147 (*)    All other components within normal limits  CBC - Abnormal; Notable for the following:    WBC 11.3 (*)    All other components within normal limits  D-DIMER, QUANTITATIVE (NOT AT [facility])  I-STAT TROPOININ, ED  Rosezena Sensor, ED    Imaging Review Dg Chest 2 View  10/14/2014   CLINICAL DATA:  Status pain while in bed last night with nausea and arm and leg weakness  EXAM: CHEST  2 VIEW  COMPARISON:  PA and lateral chest x-ray of August 26, 2013  FINDINGS: The lungs are well-expanded and clear. The heart and pulmonary vascularity are normal. The mediastinum is normal in width. There is no pleural effusion. The bony thorax is unremarkable.  IMPRESSION: There is no active cardiopulmonary disease.   Electronically Signed   By: David  [place] M.D.   On: 10/14/2014 15:42   I have personally reviewed and evaluated these images and  lab results as part of my medical decision-making.   EKG Interpretation None       ED ECG REPORT   Date: 10/14/2014  Rate: 109  Rhythm: sinus tachycardia  QRS Axis: normal  Intervals: normal  ST/T Wave abnormalities: nonspecific T wave changes  Conduction Disutrbances:none  Narrative Interpretation:   Old EKG Reviewed: none available  I have personally reviewed the EKG tracing and agree with the computerized printout as noted.   MDM   Final diagnoses:  Chest pain, unspecified chest pain type  Chronic back pain    Afebrile nontoxic patient with constant chest pain that began last night while lying flat.  Troponin  negative.  D-dimer  negative.  CXR negative.  Labs remarkable for mild leukocytosis.  EKG mild tachycardia only.  Pt has been moving houses and doing lots of heavy lifting.  Suspect muscle soreness.  Pt also noted left facial tingling and left arm numbness that occurred in the waiting room.  At change of shift, pending CT head, IVF, pain medication, delta troponin.  Discussed pt with PA Piepenbrink who will follow up on studies and reassess, disposition appropriately.      Trixie Dredge, PA-C 10/14/14 7829  Blake Divine, MD 10/15/14 [phone]

## 2014-10-14 NOTE — ED Notes (Signed)
Patient returned from CT

## 2014-10-14 NOTE — Discharge Instructions (Signed)
Please follow up with your primary care physician in 1-2 days. If you do not have one please call the  and wellness Center number listed above. Please follow up with the [facility] to schedule a follow up appointment.  °Please read all discharge instructions and return precautions.  ° ° °Chest Pain (Nonspecific) °It is often hard to give a specific diagnosis for the cause of chest pain. There is always a chance that your pain could be related to something serious, such as a heart attack or a blood clot in the lungs. You need to follow up with your health care provider for further evaluation. °CAUSES  °· Heartburn. °· Pneumonia or bronchitis. °· Anxiety or stress. °· Inflammation around your heart (pericarditis) or lung (pleuritis or pleurisy). °· A blood clot in the lung. °· A collapsed lung (pneumothorax). It can develop suddenly on its own (spontaneous pneumothorax) or from trauma to the chest. °· Shingles infection (herpes zoster virus). °The chest wall is composed of bones, muscles, and cartilage. Any of these can be the source of the pain. °· The bones can be bruised by injury. °· The muscles or cartilage can be strained by coughing or overwork. °· The cartilage can be affected by inflammation and become sore (costochondritis). °DIAGNOSIS  °Lab tests or other studies may be needed to find the cause of your pain. Your health care provider may have you take a test called an ambulatory electrocardiogram (ECG). An ECG records your heartbeat patterns over a 24-hour period. You may also have other tests, such as: °· Transthoracic echocardiogram (TTE). During echocardiography, sound waves are used to evaluate how blood flows through your heart. °· Transesophageal echocardiogram (TEE). °· Cardiac monitoring. This allows your health care provider to monitor your heart rate and rhythm in real time. °· Holter monitor. This is a portable device that records your heartbeat and can help diagnose heart  arrhythmias. It allows your health care provider to track your heart activity for several days, if needed. °· Stress tests by exercise or by giving medicine that makes the heart beat faster. °TREATMENT  °· Treatment depends on what may be causing your chest pain. Treatment may include: °¨ Acid blockers for heartburn. °¨ Anti-inflammatory medicine. °¨ Pain medicine for inflammatory conditions. °¨ Antibiotics if an infection is present. °· You may be advised to change lifestyle habits. This includes stopping smoking and avoiding alcohol, caffeine, and chocolate. °· You may be advised to keep your head raised (elevated) when sleeping. This reduces the chance of acid going backward from your stomach into your esophagus. °Most of the time, nonspecific chest pain will improve within 2-3 days with rest and mild pain medicine.  °HOME CARE INSTRUCTIONS  °· If antibiotics were prescribed, take them as directed. Finish them even if you start to feel better. °· For the next few days, avoid physical activities that bring on chest pain. Continue physical activities as directed. °· Do not use any tobacco products, including cigarettes, chewing tobacco, or electronic cigarettes. °· Avoid drinking alcohol. °· Only take medicine as directed by your health care provider. °· Follow your health care provider's suggestions for further testing if your chest pain does not go away. °· Keep any follow-up appointments you made. If you do not go to an appointment, you could develop lasting (chronic) problems with pain. If there is any problem keeping an appointment, call to reschedule. °SEEK MEDICAL CARE IF:  °· Your chest pain does not go away, even after treatment. °·   You have a rash with blisters on your chest.  You have a fever. SEEK IMMEDIATE MEDICAL CARE IF:   You have increased chest pain or pain that spreads to your arm, neck, jaw, back, or abdomen.  You have shortness of breath.  You have an increasing cough, or you cough up  blood.  You have severe back or abdominal pain.  You feel nauseous or vomit.  You have severe weakness.  You faint.  You have chills. This is an emergency. Do not wait to see if the pain will go away. Get medical help at once. Call your local emergency services (911 in [place] Do not drive yourself to the hospital. MAKE SURE YOU:   Understand these instructions.  Will watch your condition.  Will get help right away if you are not doing well or get worse. Document Released: 11/17/2004 Document Revised: 02/12/2013 Document Reviewed: 09/13/2007 [facility] Patient Information 2015 [place], [place]. This information is not intended to replace advice given to you by your health care provider. Make sure you discuss any questions you have with your health care provider.

## 2014-10-14 NOTE — ED Notes (Signed)
PHlebotomy at the bedside.  

## 2014-10-14 NOTE — ED Provider Notes (Signed)
Patient care acquired from [facility], [place] pending re-evaluation.   Results for orders placed or performed during the hospital encounter of 10/14/14  Basic metabolic panel  Result Value Ref Range   Sodium 138 135 - 145 mmol/L   Potassium 3.6 3.5 - 5.1 mmol/L   Chloride 108 101 - 111 mmol/L   CO2 15 (L) 22 - 32 mmol/L   Glucose, Bld 147 (H) 65 - 99 mg/dL   BUN 7 6 - 20 mg/dL   Creatinine, Ser 1.61 0.44 - 1.00 mg/dL   Calcium 9.7 8.9 - 09.6 mg/dL   GFR calc non Af Amer >60 >60 mL/min   GFR calc Af Amer >60 >60 mL/min   Anion gap 15 5 - 15  CBC  Result Value Ref Range   WBC 11.3 (H) 4.0 - 10.5 K/uL   RBC 4.89 3.87 - 5.11 MIL/uL   Hemoglobin 14.9 12.0 - 15.0 g/dL   HCT 04.5 40.9 - 81.1 %   MCV 92.0 78.0 - 100.0 fL   MCH 30.5 26.0 - 34.0 pg   MCHC 33.1 30.0 - 36.0 g/dL   RDW 91.4 78.2 - 95.6 %   Platelets 246 150 - 400 K/uL  D-dimer, quantitative (not at [facility])  Result Value Ref Range   D-Dimer, Quant <0.27 0.00 - 0.48 ug/mL-FEU  I-stat troponin, ED  Result Value Ref Range   Troponin i, poc 0.00 0.00 - 0.08 ng/mL   Comment 3           Dg Chest 2 View  10/14/2014   CLINICAL DATA:  Status pain while in bed last night with nausea and arm and leg weakness  EXAM: CHEST  2 VIEW  COMPARISON:  PA and lateral chest x-ray of August 26, 2013  FINDINGS: The lungs are well-expanded and clear. The heart and pulmonary vascularity are normal. The mediastinum is normal in width. There is no pleural effusion. The bony thorax is unremarkable.  IMPRESSION: There is no active cardiopulmonary disease.   Electronically Signed   By: David  [place] M.D.   On: 10/14/2014 15:42   Ct Head Wo Contrast  10/14/2014   CLINICAL DATA:  Patient reports not feeling good.  EXAM: CT HEAD WITHOUT CONTRAST  TECHNIQUE: Contiguous axial images were obtained from the base of the skull through the vertex without intravenous contrast.  COMPARISON:  09/03/2014  FINDINGS: No acute cortical infarct, hemorrhage, or mass lesion  ispresent. Ventricles are of normal size. No significant extra-axial fluid collection is present. The paranasal sinuses andmastoid air cells are clear. The osseous skull is intact.  IMPRESSION: Negative exam.   Electronically Signed   By: Signa Kell M.D.   On: 10/14/2014 21:38    1. Chest pain, unspecified chest pain type   2. Chronic back pain    Filed Vitals:   10/15/14 0000  BP: 105/62  Pulse: 70  Temp: 98 F (36.7 C)  Resp: 15   Patient is to be discharged with recommendation to follow up with PCP in regards to today's hospital visit. D-dimer negative, VSS, no tracheal deviation, no JVD or new murmur, RRR, breath sounds equal bilaterally, EKG without acute abnormalities, negative delta troponin, and negative CXR. Pt has been advised to return to the ED is CP becomes exertional, associated with diaphoresis or nausea, radiates to left jaw/arm, worsens or becomes concerning in any way. Pt appears reliable for follow up and is agreeable to discharge.   Case has been discussed with and seen by Dr. Loretha Stapler  who agrees with the above plan to discharge.    Francee Piccolo, PA-C 10/15/14 1610  Blake Divine, MD 10/16/14 [phone]

## 2014-10-14 NOTE — ED Notes (Signed)
Pt advised she was having increased sob and her face was going numb.  Sats checked and same noted to be 100% on room air.  RN Janene Madeira. was advised of same.

## 2014-10-14 NOTE — ED Notes (Signed)
Called [facility] to Follow up with Troponin.

## 2014-10-14 NOTE — ED Notes (Signed)
MD at the bedside  

## 2014-10-15 LAB — I-STAT TROPONIN, ED: Troponin i, poc: 0 ng/mL (ref 0.00–0.08)

## 2015-02-22 ENCOUNTER — Emergency Department (HOSPITAL_COMMUNITY): Payer: Medicaid Other

## 2015-02-22 ENCOUNTER — Encounter (HOSPITAL_COMMUNITY): Payer: Self-pay | Admitting: Emergency Medicine

## 2015-02-22 ENCOUNTER — Emergency Department (HOSPITAL_COMMUNITY)
Admission: EM | Admit: 2015-02-22 | Discharge: 2015-02-23 | Disposition: A | Discharge status: Home / Self Care | Payer: Medicaid Other | Attending: Emergency Medicine | Admitting: Emergency Medicine

## 2015-02-22 DIAGNOSIS — Z79899 Other long term (current) drug therapy: Secondary | ICD-10-CM | POA: Diagnosis not present

## 2015-02-22 DIAGNOSIS — F17210 Nicotine dependence, cigarettes, uncomplicated: Secondary | ICD-10-CM | POA: Insufficient documentation

## 2015-02-22 DIAGNOSIS — Q0700 Arnold-Chiari syndrome without spina bifida or hydrocephalus: Secondary | ICD-10-CM | POA: Insufficient documentation

## 2015-02-22 DIAGNOSIS — F419 Anxiety disorder, unspecified: Secondary | ICD-10-CM | POA: Insufficient documentation

## 2015-02-22 DIAGNOSIS — H538 Other visual disturbances: Secondary | ICD-10-CM | POA: Diagnosis not present

## 2015-02-22 DIAGNOSIS — R51 Headache: Secondary | ICD-10-CM | POA: Diagnosis present

## 2015-02-22 DIAGNOSIS — M419 Scoliosis, unspecified: Secondary | ICD-10-CM | POA: Insufficient documentation

## 2015-02-22 DIAGNOSIS — R519 Headache, unspecified: Secondary | ICD-10-CM

## 2015-02-22 DIAGNOSIS — Z3202 Encounter for pregnancy test, result negative: Secondary | ICD-10-CM | POA: Diagnosis not present

## 2015-02-22 DIAGNOSIS — R110 Nausea: Secondary | ICD-10-CM | POA: Diagnosis not present

## 2015-02-22 DIAGNOSIS — Z91040 Latex allergy status: Secondary | ICD-10-CM | POA: Insufficient documentation

## 2015-02-22 DIAGNOSIS — Z79891 Long term (current) use of opiate analgesic: Secondary | ICD-10-CM | POA: Diagnosis not present

## 2015-02-22 DIAGNOSIS — F329 Major depressive disorder, single episode, unspecified: Secondary | ICD-10-CM | POA: Diagnosis not present

## 2015-02-22 DIAGNOSIS — Z8679 Personal history of other diseases of the circulatory system: Secondary | ICD-10-CM | POA: Diagnosis not present

## 2015-02-22 MED ORDER — SODIUM CHLORIDE 0.9 % IV BOLUS (SEPSIS)
1000.0000 mL | Freq: Once | INTRAVENOUS | Status: AC
  Administered 2015-02-22: 1000 mL via INTRAVENOUS

## 2015-02-22 MED ORDER — PROCHLORPERAZINE EDISYLATE 5 MG/ML IJ SOLN
10.0000 mg | Freq: Once | INTRAMUSCULAR | Status: AC
  Administered 2015-02-22: 10 mg via INTRAVENOUS
  Filled 2015-02-22: qty 2

## 2015-02-22 MED ORDER — DIPHENHYDRAMINE HCL 50 MG/ML IJ SOLN
25.0000 mg | Freq: Once | INTRAMUSCULAR | Status: AC
  Administered 2015-02-22: 25 mg via INTRAVENOUS
  Filled 2015-02-22: qty 1

## 2015-02-22 NOTE — ED Notes (Signed)
Pt. reports headache with nausea and photophobia onset this morning unrelieved by prescription pain medications . Denies head injury / no fever or chills.

## 2015-02-22 NOTE — ED Provider Notes (Signed)
CSN: [id number]     Arrival date & time 02/22/15  1859 History   By signing my name below, I, Arlan Organshley Leger, attest that this documentation has been prepared under the direction and in the presence of Melene Planan Sharline Lehane, DO.  Electronically Signed: Arlan OrganAshley Leger, ED Scribe. 02/22/2015. 11:20 PM.   Chief Complaint  Patient presents with  . Headache   The history is provided by the patient. No language interpreter was used.    HPI Comments: Brooke [name] is a 40 y.o. female with a PMHx of DDD, and chiari malformation who presents to the Emergency Department complaining of constant, ongoing HA with associated nausea and mild blurry vision onset early this morning. Pt described HA as "something cracking/pressure and my head feels like its about to explode". She denies any recent injury or trauma. Discomfort is exacerbated with bright lights. No alleviating factors at this time. Prescribed Percocet and Fioricet attempted prior to arrival without any improvement. She denies any recent fever, chills, or vomiting. Pt states she was diagnosed with chiari malformation last year and states HA in located in surrounding area. No shunts in place at this time. Pt with known allergy to Digoxin and Toradol.  PCP: Jackie PlumSEI-BONSU,GEORGE, MD    Past Medical History  Diagnosis Date  . Fibromyalgia   . DDD (degenerative disc disease), cervical   . Migraine   . WPW (Wolff-Parkinson-White syndrome)   . Depression   . Anxiety   . Chiari malformation   . Scoliosis    Past Surgical History  Procedure Laterality Date  . Knee surgery Bilateral     Right Knee x 1 - Left x3  . Cesarean section    . Tubal ligation    . Eye surgery Bilateral   . Ears Bilateral   . Hernia repair    . Vein removed Left     arm   Family History  Problem Relation Age of Onset  . Migraines Mother   . CAD Father   . Migraines Daughter    Social History  Substance Use Topics  . Smoking status: Current Every Day Smoker    Types: Cigarettes   . Smokeless tobacco: Never Used  . Alcohol Use: No   OB History    No data available     Review of Systems  Constitutional: Negative for fever and chills.  HENT: Negative for congestion and rhinorrhea.   Eyes: Positive for visual disturbance. Negative for redness.  Respiratory: Negative for shortness of breath and wheezing.   Cardiovascular: Negative for chest pain and palpitations.  Gastrointestinal: Positive for nausea. Negative for vomiting.  Genitourinary: Negative for dysuria and urgency.  Musculoskeletal: Negative for myalgias and arthralgias.  Skin: Negative for pallor and wound.  Neurological: Positive for headaches. Negative for dizziness.      Allergies  Digoxin and related; Latex; Other; Toradol; and Tape  Home Medications   Prior to Admission medications   Medication Sig Start Date End Date Taking? Authorizing Provider  busPIRone (BUSPAR) 15 MG tablet Take 15 mg by mouth 2 (two) times daily.   Yes Historical Provider, MD  Butalbital-APAP-Caffeine (FIORICET) 50-300-40 MG CAPS Take 1-2 tablets by mouth every 8 (eight) hours as needed (for migraine).   Yes Historical Provider, MD  clonazePAM (KLONOPIN) 1 MG tablet Take 1 mg by mouth at bedtime as needed for anxiety (sleep/restless leg).   Yes Historical Provider, MD  Oxycodone HCl 10 MG TABS Take 10 mg by mouth 4 (four) times daily. 02/18/15  Yes Historical  Provider, MD  tiZANidine (ZANAFLEX) 4 MG tablet Take 4 mg by mouth every 8 (eight) hours. 02/18/15  Yes Historical Provider, MD  divalproex (DEPAKOTE ER) 500 MG 24 hr tablet Take 1 tablet (500 mg total) by mouth daily. Patient not taking: Reported on 02/22/2015 12/24/13   Levert Feinstein, MD  eletriptan (RELPAX) 40 MG tablet Take 1 tablet (40 mg total) by mouth as needed for migraine or headache. One tablet by mouth at onset of headache. May repeat in 2 hours if headache persists or recurs. Patient not taking: Reported on 07/12/2014 03/26/14   Levert Feinstein, MD  hydrocortisone  cream 1 % Apply to affected area 2 times daily Patient not taking: Reported on 07/12/2014 11/28/13   Ladona Mow, PA-C  levonorgestrel (MIRENA) 20 MCG/24HR IUD 1 each by Intrauterine route once.    Historical Provider, MD  methocarbamol (ROBAXIN) 500 MG tablet Take 1 tablet (500 mg total) by mouth every 8 (eight) hours as needed for muscle spasms. Patient not taking: Reported on 02/22/2015 07/12/14   Mercedes Camprubi-Soms, PA-C  nortriptyline (PAMELOR) 50 MG capsule Take 50 mg by mouth at bedtime. Reported on 02/22/2015    Historical Provider, MD  promethazine (PHENERGAN) 25 MG tablet Take 1 tablet (25 mg total) by mouth every 6 (six) hours as needed for nausea or vomiting. Patient not taking: Reported on 07/12/2014 02/03/14   Levert Feinstein, MD  SUMAtriptan (IMITREX) 6 MG/0.5ML SOLN injection Inject 0.5 mLs (6 mg total) into the skin every 2 (two) hours as needed for migraine or headache. May repeat in 2 hours if headache persists or recurs. Patient not taking: Reported on 02/22/2015 12/24/13   Levert Feinstein, MD   Triage Vitals: BP 112/70 mmHg  Pulse 58  Temp(Src) 98.6 F (37 C) (Oral)  Resp 18  SpO2 98%   Physical Exam  Constitutional: She is oriented to person, place, and time. She appears well-developed and well-nourished. No distress.  HENT:  Head: Normocephalic and atraumatic.  Eyes: EOM are normal. Pupils are equal, round, and reactive to light.  Neck: Normal range of motion. Neck supple.  Cardiovascular: Normal rate and regular rhythm.  Exam reveals no gallop and no friction rub.   No murmur heard. Pulmonary/Chest: Effort normal. She has no wheezes. She has no rales.  Abdominal: Soft. She exhibits no distension. There is no tenderness.  Musculoskeletal: She exhibits no edema or tenderness.  Neurological: She is alert and oriented to person, place, and time. She has normal strength. No cranial nerve deficit or sensory deficit. She displays a negative Romberg sign. Coordination and gait normal. GCS eye  subscore is 4. GCS verbal subscore is 5. GCS motor subscore is 6. She displays no Babinski's sign on the right side. She displays no Babinski's sign on the left side.  Reflex Scores:      Tricep reflexes are 2+ on the right side and 2+ on the left side.      Bicep reflexes are 2+ on the right side and 2+ on the left side.      Brachioradialis reflexes are 2+ on the right side and 2+ on the left side.      Patellar reflexes are 2+ on the right side and 2+ on the left side.      Achilles reflexes are 2+ on the right side and 2+ on the left side. Benign neuro examination   Skin: Skin is warm and dry. She is not diaphoretic.  Psychiatric: She has a normal mood and affect. Her  behavior is normal.  Nursing note and vitals reviewed.   ED Course  Procedures (including critical care time)  DIAGNOSTIC STUDIES: Oxygen Saturation is 98% on RA, Normal by my interpretation.    COORDINATION OF CARE: 11:15 PM- Will give Compazine, fluids, and Benadryl. Will order CT head without contrast and urine pregnancy. Discussed treatment plan with pt at bedside and pt agreed to plan.     1:35 AM- Updated pt on results. Pt states she is feeling better after medications and fluids.  Labs Review Labs Reviewed  POC URINE PREG, ED    Imaging Review Ct Head Wo Contrast  02/23/2015  CLINICAL DATA:  Headache. Nausea and photophobia. Onset this morning. EXAM: CT HEAD WITHOUT CONTRAST TECHNIQUE: Contiguous axial images were obtained from the base of the skull through the vertex without intravenous contrast. COMPARISON:  10/14/2014, multiple priors FINDINGS: No intracranial hemorrhage, mass effect, or midline shift. No hydrocephalus. The basilar cisterns are patent. No evidence of territorial infarct. No intracranial fluid collection. Calvarium is intact. Included paranasal sinuses and mastoid air cells are well aerated. IMPRESSION: No acute intracranial abnormality. Electronically Signed   By: Rubye Oaks M.D.   On:  02/23/2015 00:52   I have personally reviewed and evaluated these images and lab results as part of my medical decision-making.   EKG Interpretation None      MDM   Final diagnoses:  Acute nonintractable headache, unspecified headache type    40 yo F with a cc of headache.  Started about noon.  Slow onset worsening throughout the day.  Patient denies injury, fevers, chills.  Hx of chiari malformation.  Patient has a history of migraines feels like this is slightly different. Patient started her home medications without relief. Benign neuro exam. Will obtain a CT scan of the head due to history of Chiari malformation. Treat with a migraine cocktail.  CT negative, feeling much better after migraine cocktail, d/c home.    I have discussed the diagnosis/risks/treatment options with the patient and believe the pt to be eligible for discharge home to follow-up with PCP/neuro. We also discussed returning to the ED immediately if new or worsening sx occur. We discussed the sx which are most concerning (e.g., sudden worsening pain, fever, inability to tolerate by mouth) that necessitate immediate return. Medications administered to the patient during their visit and any new prescriptions provided to the patient are listed below.  Medications given during this visit Medications  prochlorperazine (COMPAZINE) injection 10 mg (10 mg Intravenous Given 02/22/15 2335)  sodium chloride 0.9 % bolus 1,000 mL (0 mLs Intravenous Stopped 02/23/15 0145)  diphenhydrAMINE (BENADRYL) injection 25 mg (25 mg Intravenous Given 02/22/15 2335)    Discharge Medication List as of 02/23/2015  1:36 AM      The patient appears reasonably screen and/or stabilized for discharge and I doubt any other medical condition or other [facility] requiring further screening, evaluation, or treatment in the ED at this time prior to discharge.    I personally performed the services described in this documentation, which was scribed in my  presence. The recorded information has been reviewed and is accurate.     Melene Plan, DO 02/23/15 2042

## 2015-02-23 ENCOUNTER — Emergency Department (HOSPITAL_COMMUNITY): Payer: Medicaid Other

## 2015-02-23 ENCOUNTER — Encounter (HOSPITAL_COMMUNITY): Payer: Self-pay | Admitting: Radiology

## 2015-02-23 LAB — POC URINE PREG, ED: Preg Test, Ur: NEGATIVE

## 2015-02-23 NOTE — Discharge Instructions (Signed)

## 2015-03-01 ENCOUNTER — Emergency Department (HOSPITAL_COMMUNITY): Payer: Medicaid Other

## 2015-03-01 ENCOUNTER — Emergency Department (HOSPITAL_COMMUNITY)
Admission: EM | Admit: 2015-03-01 | Discharge: 2015-03-01 | Disposition: A | Discharge status: Home / Self Care | Payer: Medicaid Other | Attending: Emergency Medicine | Admitting: Emergency Medicine

## 2015-03-01 ENCOUNTER — Encounter (HOSPITAL_COMMUNITY): Payer: Self-pay

## 2015-03-01 DIAGNOSIS — S3992XA Unspecified injury of lower back, initial encounter: Secondary | ICD-10-CM | POA: Diagnosis not present

## 2015-03-01 DIAGNOSIS — M5186 Other intervertebral disc disorders, lumbar region: Secondary | ICD-10-CM | POA: Diagnosis not present

## 2015-03-01 DIAGNOSIS — M5030 Other cervical disc degeneration, unspecified cervical region: Secondary | ICD-10-CM | POA: Insufficient documentation

## 2015-03-01 DIAGNOSIS — F17210 Nicotine dependence, cigarettes, uncomplicated: Secondary | ICD-10-CM | POA: Diagnosis not present

## 2015-03-01 DIAGNOSIS — Y9389 Activity, other specified: Secondary | ICD-10-CM | POA: Diagnosis not present

## 2015-03-01 DIAGNOSIS — S79912A Unspecified injury of left hip, initial encounter: Secondary | ICD-10-CM | POA: Diagnosis not present

## 2015-03-01 DIAGNOSIS — W19XXXA Unspecified fall, initial encounter: Secondary | ICD-10-CM

## 2015-03-01 DIAGNOSIS — S199XXA Unspecified injury of neck, initial encounter: Secondary | ICD-10-CM | POA: Diagnosis not present

## 2015-03-01 DIAGNOSIS — Y998 Other external cause status: Secondary | ICD-10-CM | POA: Insufficient documentation

## 2015-03-01 DIAGNOSIS — G43909 Migraine, unspecified, not intractable, without status migrainosus: Secondary | ICD-10-CM | POA: Insufficient documentation

## 2015-03-01 DIAGNOSIS — F419 Anxiety disorder, unspecified: Secondary | ICD-10-CM | POA: Diagnosis not present

## 2015-03-01 DIAGNOSIS — Q0700 Arnold-Chiari syndrome without spina bifida or hydrocephalus: Secondary | ICD-10-CM | POA: Insufficient documentation

## 2015-03-01 DIAGNOSIS — Z8679 Personal history of other diseases of the circulatory system: Secondary | ICD-10-CM | POA: Diagnosis not present

## 2015-03-01 DIAGNOSIS — F329 Major depressive disorder, single episode, unspecified: Secondary | ICD-10-CM | POA: Insufficient documentation

## 2015-03-01 DIAGNOSIS — S59902A Unspecified injury of left elbow, initial encounter: Secondary | ICD-10-CM | POA: Insufficient documentation

## 2015-03-01 DIAGNOSIS — M545 Low back pain, unspecified: Secondary | ICD-10-CM

## 2015-03-01 DIAGNOSIS — M542 Cervicalgia: Secondary | ICD-10-CM

## 2015-03-01 DIAGNOSIS — Y9289 Other specified places as the place of occurrence of the external cause: Secondary | ICD-10-CM | POA: Insufficient documentation

## 2015-03-01 DIAGNOSIS — W000XXA Fall on same level due to ice and snow, initial encounter: Secondary | ICD-10-CM | POA: Insufficient documentation

## 2015-03-01 DIAGNOSIS — M419 Scoliosis, unspecified: Secondary | ICD-10-CM | POA: Insufficient documentation

## 2015-03-01 DIAGNOSIS — Z91040 Latex allergy status: Secondary | ICD-10-CM | POA: Diagnosis not present

## 2015-03-01 DIAGNOSIS — M25552 Pain in left hip: Secondary | ICD-10-CM

## 2015-03-01 DIAGNOSIS — Z79899 Other long term (current) drug therapy: Secondary | ICD-10-CM | POA: Diagnosis not present

## 2015-03-01 DIAGNOSIS — M25522 Pain in left elbow: Secondary | ICD-10-CM

## 2015-03-01 HISTORY — DX: Other intervertebral disc degeneration, lumbar region without mention of lumbar back pain or lower extremity pain: M51.369

## 2015-03-01 HISTORY — DX: Other intervertebral disc degeneration, lumbar region: M51.36

## 2015-03-01 LAB — I-STAT BETA HCG BLOOD, ED (MC, WL, AP ONLY): I-stat hCG, quantitative: <5 m[IU]/mL (ref <5)

## 2015-03-01 MED ORDER — HYDROMORPHONE HCL 1 MG/ML IJ SOLN
1.0000 mg | Freq: Once | INTRAMUSCULAR | Status: DC
  Filled 2015-03-01: qty 1

## 2015-03-01 MED ORDER — OXYCODONE-ACETAMINOPHEN 5-325 MG PO TABS
2.0000 | ORAL_TABLET | Freq: Once | ORAL | Status: AC
  Administered 2015-03-01: 2 via ORAL
  Filled 2015-03-01: qty 2

## 2015-03-01 MED ORDER — IBUPROFEN 800 MG PO TABS: 800.0000 mg | ORAL_TABLET | Freq: Three times a day (TID) | ORAL | Status: DC

## 2015-03-01 MED ORDER — IBUPROFEN 800 MG PO TABS
800.0000 mg | ORAL_TABLET | Freq: Once | ORAL | Status: AC
  Administered 2015-03-01: 800 mg via ORAL
  Filled 2015-03-01: qty 1

## 2015-03-01 MED ORDER — OXYCODONE-ACETAMINOPHEN 5-325 MG PO TABS: 1.0000 | ORAL_TABLET | ORAL | Status: DC | PRN

## 2015-03-01 MED ORDER — DICLOFENAC SODIUM 1 % TD GEL: 2.0000 g | Freq: Four times a day (QID) | TRANSDERMAL | Status: DC

## 2015-03-01 NOTE — ED Notes (Signed)
Pt presents with c-collar in place & immobilizer to left arm.

## 2015-03-01 NOTE — ED Notes (Signed)
Per EMS- Patient fell on ice, falling straight back. Patient c/o left hip, left elbow, and lumbar/sacaral pain. Left arms swelling noted. Patient placed on KED. No LOC. Patient did not hit her head.

## 2015-03-01 NOTE — ED Notes (Signed)
Discharge instructions, follow up care, and rx x3 reviewed with patient. Patient verbalized understanding. 

## 2015-03-01 NOTE — ED Provider Notes (Signed)
CSN: [id number]     Arrival date & time 03/01/15  1530 History   First MD Initiated Contact with Patient 03/01/15 1612     Chief Complaint  Patient presents with  . Fall  . Hip Pain  . Elbow Pain  . Back Pain     (Consider location/radiation/quality/duration/timing/severity/associated sxs/prior Treatment) HPI   Brooke [name] is a 40 y.o. female, with a history of Chiari malformation, depression, WPW, and anxiety, presenting to the ED with injuries from a fall. Patient states that she slipped on some ice last night fell onto her left hip and left elbow. Complaining of pain in the left elbow and forearm, left hip, lower back, and neck. Patient rates the pain in her left elbow at a 10 out of 10, describes it as throbbing, and radiates into her left forearm. Patient rates the pain in her lower back, left hip, and neck at about 6 and 10, describes it as an aching, nonradiating. Patient was able to get to her feet with assistance and walk into the house. Patient denies head trauma, LOC, neurologic deficits, or any other pain or complaints   Past Medical History  Diagnosis Date  . Fibromyalgia   . DDD (degenerative disc disease), cervical   . Migraine   . WPW (Wolff-Parkinson-White syndrome)   . Depression   . Anxiety   . Chiari malformation   . Scoliosis   . Degenerative disc disease, lumbar    Past Surgical History  Procedure Laterality Date  . Knee surgery Bilateral     Right Knee x 1 - Left x3  . Cesarean section    . Tubal ligation    . Eye surgery Bilateral   . Ears Bilateral   . Hernia repair    . Vein removed Left     arm  . Herneated bowel repair     Family History  Problem Relation Age of Onset  . Migraines Mother   . CAD Father   . Migraines Daughter    Social History  Substance Use Topics  . Smoking status: Current Every Day Smoker    Types: Cigarettes  . Smokeless tobacco: Never Used  . Alcohol Use: No   OB History    No data available     Review of  Systems  Constitutional:       Fall  Respiratory: Negative for shortness of breath.   Cardiovascular: Negative for chest pain.  Gastrointestinal: Negative for nausea, vomiting and abdominal pain.  Musculoskeletal: Positive for back pain, arthralgias (Left elbow and left hip) and neck pain.  Neurological: Negative for dizziness, weakness, light-headedness, numbness and headaches.  All other systems reviewed and are negative.     Allergies  Digoxin and related; Latex; Other; Toradol; and Tape  Home Medications   Prior to Admission medications   Medication Sig Start Date End Date Taking? Authorizing Provider  busPIRone (BUSPAR) 15 MG tablet Take 15 mg by mouth 2 (two) times daily.   Yes Historical Provider, MD  Butalbital-APAP-Caffeine (FIORICET) 50-300-40 MG CAPS Take 1-2 tablets by mouth every 8 (eight) hours as needed (for migraine).   Yes Historical Provider, MD  clonazePAM (KLONOPIN) 1 MG tablet Take 0.5-1 mg by mouth at bedtime as needed (sleep).    Yes Historical Provider, MD  levonorgestrel (MIRENA) 20 MCG/24HR IUD 1 each by Intrauterine route once.   Yes Historical Provider, MD  nortriptyline (PAMELOR) 25 MG capsule Take 25 mg by mouth at bedtime as needed for sleep (and anxiety).  Yes Historical Provider, MD  Oxycodone HCl 10 MG TABS Take 10 mg by mouth 4 (four) times daily. 02/18/15  Yes Historical Provider, MD  Pseudoephedrine HCl (SUDAFED 12 HOUR PO) Take 1 tablet by mouth daily as needed (cold symptoms).   Yes Historical Provider, MD  tiZANidine (ZANAFLEX) 4 MG tablet Take 4 mg by mouth every 8 (eight) hours. 02/18/15  Yes Historical Provider, MD  diclofenac sodium (VOLTAREN) 1 % GEL Apply 2 g topically 4 (four) times daily. 03/01/15   Shawn C Joy, PA-C  ibuprofen (ADVIL,MOTRIN) 800 MG tablet Take 1 tablet (800 mg total) by mouth 3 (three) times daily. 03/01/15   Shawn C Joy, PA-C   BP 127/84 mmHg  Pulse 73  Temp(Src) 98.3 F (36.8 C) (Oral)  Resp 18  SpO2 99%  LMP   Physical Exam  Constitutional: She is oriented to person, place, and time. She appears well-developed and well-nourished. No distress.  HENT:  Head: Normocephalic and atraumatic.  Eyes: Conjunctivae and EOM are normal. Pupils are equal, round, and reactive to light.  Neck: Neck supple.  Cardiovascular: Normal rate, regular rhythm, normal heart sounds and intact distal pulses.   Pulmonary/Chest: Effort normal and breath sounds normal. No respiratory distress.  Abdominal: Soft. Bowel sounds are normal.  Musculoskeletal: She exhibits no edema or tenderness.  Paraspinal tenderness to the C-spine and L-spine. No crepitus, step down, or instability. Patient has full range of motion in her extremities except for her left elbow. Left elbow is contained in a full arm splint, with swelling and tenderness on the posterior aspect. No deformity noted. Distal pulses intact.  Lymphadenopathy:    She has no cervical adenopathy.  Neurological: She is alert and oriented to person, place, and time. She has normal reflexes.  No sensory deficits. Strength 5/5 in all extremities. Coordination intact. Cranial nerves III-XII grossly intact.  Skin: Skin is warm and dry. She is not diaphoretic.  Nursing note and vitals reviewed.   ED Course  Procedures (including critical care time) Labs Review Labs Reviewed  I-STAT BETA HCG BLOOD, ED ([facility], [facility], [facility] ONLY)    Imaging Review Dg Cervical Spine Complete  03/01/2015  CLINICAL DATA:  Recent slip and fall with neck pain, initial encounter EXAM: CERVICAL SPINE - COMPLETE 4+ VIEW COMPARISON:  None. FINDINGS: Seven cervical segments are well visualized. The normal cervical lordosis is mildly straightened likely related to muscular spasm. No acute fracture or acute facet abnormality is noted. A tongue piercing is seen. No acute bony abnormality is noted. No soft tissue changes are noted. IMPRESSION: Straightening of the normal cervical lordosis likely related to muscular  spasm. No acute bony abnormality is seen. Electronically Signed   By: Alcide Clever M.D.   On: 03/01/2015 18:06   Dg Lumbar Spine Complete  03/01/2015  CLINICAL DATA:  Slip and fall with low back pain, initial encounter EXAM: LUMBAR SPINE - COMPLETE 4+ VIEW COMPARISON:  06/14/2013 FINDINGS: Five lumbar type vertebral bodies are well visualized. Vertebral body height is well maintained. Very minimal osteophytic changes are seen. No spondylolisthesis is noted. A right renal calculus is noted which measures approximately 8 mm. This projects in the region of the right renal pelvis. No other focal abnormality is noted. IMPRESSION: Right renal calculus. No acute bony abnormality noted. Electronically Signed   By: Alcide Clever M.D.   On: 03/01/2015 18:03   Dg Elbow Complete Left  03/01/2015  CLINICAL DATA:  Recent slip and fall with elbow pain, initial encounter EXAM:  LEFT ELBOW - COMPLETE 3+ VIEW COMPARISON:  None. FINDINGS: There is no evidence of fracture, dislocation, or joint effusion. There is no evidence of arthropathy or other focal bone abnormality. Soft tissues are unremarkable. IMPRESSION: No acute abnormality noted. Electronically Signed   By: Alcide CleverMark  Lukens M.D.   On: 03/01/2015 17:58   Dg Forearm Left  03/01/2015  CLINICAL DATA:  Recent slip and fall with left forearm pain, initial encounter EXAM: LEFT FOREARM - 2 VIEW COMPARISON:  None. FINDINGS: There is no evidence of fracture or other focal bone lesions. Soft tissues are unremarkable. IMPRESSION: No acute abnormality noted. Electronically Signed   By: Alcide CleverMark  Lukens M.D.   On: 03/01/2015 18:00   Dg Hip Unilat With Pelvis 2-3 Views Left  03/01/2015  CLINICAL DATA:  Slip and fall with left hip pain, initial encounter EXAM: DG HIP (WITH OR WITHOUT PELVIS) 2-3V LEFT COMPARISON:  None. FINDINGS: There is no evidence of hip fracture or dislocation. There is no evidence of arthropathy or other focal bone abnormality. An IUD is noted in place. IMPRESSION: No  acute abnormality noted. Electronically Signed   By: Alcide CleverMark  Lukens M.D.   On: 03/01/2015 18:09   I have personally reviewed and evaluated these images as part of my medical decision-making.   EKG Interpretation None      MDM   Final diagnoses:  Fall, initial encounter  Elbow pain, left  Hip pain, left  Midline low back pain without sciatica  Neck pain    Brooke LimHolly Regis presents with injuries from a fall including left elbow, left hip, lower back, and neck pain.  Due to the patient's complaints, her physical exam findings, and the level of her pain imaging of the areas in question is indicated. Patient denies head trauma, has no neurologic deficits, and denies LOC. No abnormalities were found on the patient's x-rays. Patient tells the nurse that she takes 10 mg of oxycodone 4 times a day for her lower back pain. A search of the controlled substance database reveals that the patient filled a prescription for 10 mg oxycodone 120 count on 02/18/2015. Patient has no indications for further imaging and appears appropriate for discharge at this time. The patient was given instructions for home care as well as return precautions. Patient voices understanding of these instructions, accepts the plan, and is comfortable with discharge.   Anselm PancoastShawn C Joy, PA-C 03/01/15 1845  Lorre NickAnthony Allen, MD 03/04/15 [phone]

## 2015-03-01 NOTE — ED Notes (Signed)
Patient transported to X-ray 

## 2015-03-01 NOTE — ED Notes (Signed)
Pt states "I take oxy 10 mg qid for my lower back."  Spoke to Dole FoodShawn Joy, PA-C.

## 2015-03-01 NOTE — Discharge Instructions (Signed)
You have been seen today for injuries from a fall. Your imaging showed no abnormalities. Follow up with orthopedics on this issue. Follow up with PCP as needed. Return to ED should symptoms worsen.

## 2015-03-12 ENCOUNTER — Encounter: Payer: Self-pay | Admitting: Gastroenterology

## 2015-03-26 ENCOUNTER — Ambulatory Visit: Payer: Medicaid Other | Admitting: Physician Assistant

## 2015-04-20 ENCOUNTER — Encounter (HOSPITAL_COMMUNITY): Payer: Self-pay | Admitting: *Deleted

## 2015-04-20 ENCOUNTER — Inpatient Hospital Stay (HOSPITAL_COMMUNITY)
Admission: EM | Admit: 2015-04-20 | Discharge: 2015-04-22 | DRG: 694 | Disposition: A | Discharge status: Home / Self Care | Payer: Medicaid Other | Attending: Internal Medicine | Admitting: Internal Medicine

## 2015-04-20 ENCOUNTER — Emergency Department (HOSPITAL_COMMUNITY): Payer: Medicaid Other

## 2015-04-20 DIAGNOSIS — Z91040 Latex allergy status: Secondary | ICD-10-CM | POA: Diagnosis not present

## 2015-04-20 DIAGNOSIS — F419 Anxiety disorder, unspecified: Secondary | ICD-10-CM | POA: Diagnosis present

## 2015-04-20 DIAGNOSIS — Z8249 Family history of ischemic heart disease and other diseases of the circulatory system: Secondary | ICD-10-CM | POA: Diagnosis not present

## 2015-04-20 DIAGNOSIS — Z79891 Long term (current) use of opiate analgesic: Secondary | ICD-10-CM

## 2015-04-20 DIAGNOSIS — M419 Scoliosis, unspecified: Secondary | ICD-10-CM | POA: Diagnosis present

## 2015-04-20 DIAGNOSIS — G43909 Migraine, unspecified, not intractable, without status migrainosus: Secondary | ICD-10-CM | POA: Diagnosis present

## 2015-04-20 DIAGNOSIS — N201 Calculus of ureter: Secondary | ICD-10-CM | POA: Diagnosis not present

## 2015-04-20 DIAGNOSIS — Z87442 Personal history of urinary calculi: Secondary | ICD-10-CM

## 2015-04-20 DIAGNOSIS — N200 Calculus of kidney: Secondary | ICD-10-CM | POA: Diagnosis not present

## 2015-04-20 DIAGNOSIS — F17210 Nicotine dependence, cigarettes, uncomplicated: Secondary | ICD-10-CM | POA: Diagnosis present

## 2015-04-20 DIAGNOSIS — I456 Pre-excitation syndrome: Secondary | ICD-10-CM | POA: Diagnosis present

## 2015-04-20 DIAGNOSIS — N390 Urinary tract infection, site not specified: Secondary | ICD-10-CM | POA: Diagnosis present

## 2015-04-20 DIAGNOSIS — Z888 Allergy status to other drugs, medicaments and biological substances status: Secondary | ICD-10-CM | POA: Diagnosis not present

## 2015-04-20 DIAGNOSIS — N132 Hydronephrosis with renal and ureteral calculous obstruction: Principal | ICD-10-CM | POA: Diagnosis present

## 2015-04-20 DIAGNOSIS — F32A Depression, unspecified: Secondary | ICD-10-CM | POA: Insufficient documentation

## 2015-04-20 DIAGNOSIS — Z791 Long term (current) use of non-steroidal anti-inflammatories (NSAID): Secondary | ICD-10-CM

## 2015-04-20 DIAGNOSIS — M797 Fibromyalgia: Secondary | ICD-10-CM | POA: Diagnosis present

## 2015-04-20 DIAGNOSIS — G894 Chronic pain syndrome: Secondary | ICD-10-CM | POA: Diagnosis present

## 2015-04-20 DIAGNOSIS — Z9109 Other allergy status, other than to drugs and biological substances: Secondary | ICD-10-CM | POA: Diagnosis not present

## 2015-04-20 DIAGNOSIS — Z886 Allergy status to analgesic agent status: Secondary | ICD-10-CM

## 2015-04-20 DIAGNOSIS — E785 Hyperlipidemia, unspecified: Secondary | ICD-10-CM | POA: Diagnosis present

## 2015-04-20 DIAGNOSIS — Z720 Tobacco use: Secondary | ICD-10-CM | POA: Diagnosis not present

## 2015-04-20 DIAGNOSIS — R109 Unspecified abdominal pain: Secondary | ICD-10-CM | POA: Diagnosis present

## 2015-04-20 DIAGNOSIS — Z79899 Other long term (current) drug therapy: Secondary | ICD-10-CM

## 2015-04-20 DIAGNOSIS — F329 Major depressive disorder, single episode, unspecified: Secondary | ICD-10-CM | POA: Diagnosis present

## 2015-04-20 LAB — URINE MICROSCOPIC-ADD ON

## 2015-04-20 LAB — URINALYSIS, ROUTINE W REFLEX MICROSCOPIC
GLUCOSE, UA: NEGATIVE mg/dL
Ketones, ur: NEGATIVE mg/dL
NITRITE: NEGATIVE
PH: 5.5 (ref 5.0–8.0)
Protein, ur: 30 mg/dL — AB
SPECIFIC GRAVITY, URINE: 1.023 (ref 1.005–1.030)

## 2015-04-20 LAB — CBC
HEMATOCRIT: 42.5 % (ref 36.0–46.0)
HEMOGLOBIN: 13.7 g/dL (ref 12.0–15.0)
MCH: 29.2 pg (ref 26.0–34.0)
MCHC: 32.2 g/dL (ref 30.0–36.0)
MCV: 90.6 fL (ref 78.0–100.0)
Platelets: 211 10*3/uL (ref 150–400)
RBC: 4.69 MIL/uL (ref 3.87–5.11)
RDW: 13.2 % (ref 11.5–15.5)
WBC: 7.9 10*3/uL (ref 4.0–10.5)

## 2015-04-20 LAB — I-STAT BETA HCG BLOOD, ED (MC, WL, AP ONLY): I-stat hCG, quantitative: <5 m[IU]/mL (ref <5)

## 2015-04-20 MED ORDER — HYDROCODONE-ACETAMINOPHEN 5-325 MG PO TABS
1.0000 | ORAL_TABLET | ORAL | Status: DC | PRN
  Administered 2015-04-21 – 2015-04-22 (×6): 2 via ORAL
  Filled 2015-04-20 (×7): qty 2

## 2015-04-20 MED ORDER — BUSPIRONE HCL 15 MG PO TABS
15.0000 mg | ORAL_TABLET | Freq: Two times a day (BID) | ORAL | Status: DC
  Administered 2015-04-20 – 2015-04-22 (×4): 15 mg via ORAL
  Filled 2015-04-20 (×5): qty 1

## 2015-04-20 MED ORDER — ACETAMINOPHEN 650 MG RE SUPP: 650.0000 mg | Freq: Four times a day (QID) | RECTAL | Status: DC | PRN

## 2015-04-20 MED ORDER — POLYETHYLENE GLYCOL 3350 17 G PO PACK: 17.0000 g | PACK | Freq: Every day | ORAL | Status: DC | PRN

## 2015-04-20 MED ORDER — CIPROFLOXACIN IN D5W 400 MG/200ML IV SOLN
400.0000 mg | Freq: Once | INTRAVENOUS | Status: AC
  Administered 2015-04-20: 400 mg via INTRAVENOUS
  Filled 2015-04-20: qty 200

## 2015-04-20 MED ORDER — ONDANSETRON HCL 4 MG PO TABS: 4.0000 mg | ORAL_TABLET | Freq: Four times a day (QID) | ORAL | Status: DC | PRN

## 2015-04-20 MED ORDER — TAMSULOSIN HCL 0.4 MG PO CAPS
0.4000 mg | ORAL_CAPSULE | Freq: Every day | ORAL | Status: DC
  Administered 2015-04-21 – 2015-04-22 (×2): 0.4 mg via ORAL
  Filled 2015-04-20 (×2): qty 1

## 2015-04-20 MED ORDER — HYDROMORPHONE HCL 1 MG/ML IJ SOLN
1.0000 mg | Freq: Once | INTRAMUSCULAR | Status: AC
  Administered 2015-04-20: 1 mg via INTRAVENOUS
  Filled 2015-04-20: qty 1

## 2015-04-20 MED ORDER — CLONAZEPAM 1 MG PO TABS
1.0000 mg | ORAL_TABLET | Freq: Every day | ORAL | Status: DC
  Administered 2015-04-20 – 2015-04-21 (×2): 1 mg via ORAL
  Filled 2015-04-20 (×2): qty 1

## 2015-04-20 MED ORDER — HYDROXYZINE HCL 25 MG PO TABS: 25.0000 mg | ORAL_TABLET | Freq: Four times a day (QID) | ORAL | Status: DC | PRN

## 2015-04-20 MED ORDER — HYDROMORPHONE HCL 1 MG/ML IJ SOLN
1.0000 mg | INTRAMUSCULAR | Status: DC | PRN
  Administered 2015-04-20 – 2015-04-21 (×4): 1 mg via INTRAVENOUS
  Filled 2015-04-20 (×4): qty 1

## 2015-04-20 MED ORDER — TIZANIDINE HCL 4 MG PO TABS: 4.0000 mg | ORAL_TABLET | Freq: Three times a day (TID) | ORAL | Status: DC | PRN

## 2015-04-20 MED ORDER — ONDANSETRON HCL 4 MG/2ML IJ SOLN: 4.0000 mg | Freq: Four times a day (QID) | INTRAMUSCULAR | Status: DC | PRN

## 2015-04-20 MED ORDER — CLONAZEPAM 0.5 MG PO TABS
0.5000 mg | ORAL_TABLET | Freq: Every day | ORAL | Status: DC
  Administered 2015-04-21 – 2015-04-22 (×2): 0.5 mg via ORAL
  Filled 2015-04-20 (×2): qty 1

## 2015-04-20 MED ORDER — METHOCARBAMOL 500 MG PO TABS
500.0000 mg | ORAL_TABLET | Freq: Four times a day (QID) | ORAL | Status: DC | PRN
  Administered 2015-04-20 – 2015-04-22 (×5): 500 mg via ORAL
  Filled 2015-04-20 (×5): qty 1

## 2015-04-20 MED ORDER — HEPARIN SODIUM (PORCINE) 5000 UNIT/ML IJ SOLN
5000.0000 [IU] | Freq: Three times a day (TID) | INTRAMUSCULAR | Status: DC
  Administered 2015-04-20 – 2015-04-21 (×3): 5000 [IU] via SUBCUTANEOUS
  Filled 2015-04-20 (×8): qty 1

## 2015-04-20 MED ORDER — CIPROFLOXACIN IN D5W 400 MG/200ML IV SOLN
400.0000 mg | Freq: Two times a day (BID) | INTRAVENOUS | Status: DC
  Administered 2015-04-21 (×2): 400 mg via INTRAVENOUS
  Filled 2015-04-20 (×3): qty 200

## 2015-04-20 MED ORDER — SODIUM CHLORIDE 0.9 % IV SOLN
INTRAVENOUS | Status: DC
  Administered 2015-04-20 – 2015-04-21 (×2): via INTRAVENOUS

## 2015-04-20 MED ORDER — BISACODYL 5 MG PO TBEC: 5.0000 mg | DELAYED_RELEASE_TABLET | Freq: Every day | ORAL | Status: DC | PRN

## 2015-04-20 MED ORDER — ACETAMINOPHEN 325 MG PO TABS: 650.0000 mg | ORAL_TABLET | Freq: Four times a day (QID) | ORAL | Status: DC | PRN

## 2015-04-20 NOTE — H&P (Signed)
[facility] History and Physical  Brooke [name] ZOX:[id number] DOB: 12-01-1975 DOA: 04/20/2015  Referring physician: ED physician PCP: Jackie Plum, MD  Specialists:  Dr. Terrace Arabia, neurology   Chief Complaint:  Right flank pain, nausea, hematuria   HPI: Brooke [name] is a 40 y.o. female with PMH of bilateral nephrolithiasis requiring multiple surgical interventions, depression and anxiety, fibromyalgia, and Wolff-Parkinson-White syndrome who presents to the ED with 1 week of progressive right flank pain with nausea and gross hematuria. Patient reports being in her usual state of health until 04/13/2015 when she awoke with acute right flank pain. Pain waxed and waned over the course of 2-3 days and eventually resolved until she again woke with acute right flank pain on the morning of admission. She describes the pain as 10/10 in intensity, "achy" and "pressure" in character, occasionally radiating to the right lower quadrant, associated with nausea but no vomiting, and with concomitant development of gross hematuria. Pain is exacerbated with urination and no alleviating factors have been identified by the patient. Symptoms are very similar to those experienced in the past with obstructing renal calculi. Patient had previously resided in [place], where she reportedly underwent 5 extraction procedures, mainly directed at the right kidney, and with the most recent in 2012. She has not established with a urologist in [place]. She denies any fevers or chills, chest pain or palpitations, headaches or lightheadedness.  In ED, patient was found to be afebrile, saturating well on room air, and with vital signs stable. CBC was obtained and normal. Urinalysis features few bacteria, large hemoglobin, moderate leukocytes, negative nitrite, and too numerous to count white blood cells. CT of the abdomen and pelvis is obtained in the ED and demonstrates an 8 mm obstructing calculus at the right UPJ.  There is associated mild right hydronephrosis and perinephric stranding. Also noted on CT are multiple, nonobstructing stones bilaterally. Dr. Sherryl Barters of urology was consulted from the emergency department and has requested her admission to [facility]. Patient remained hemodynamically stable in the emergency department, was treated empirically with a dose of IV ciprofloxacin, and will be admitted to [facility] for ongoing evaluation and management of obstructing stone at the right UPJ.  Where does patient live?   At home   Can patient participate in ADLs?  Yes       Review of Systems:   General: no fevers, chills, sweats, weight change, poor appetite, or fatigue HEENT: no blurry vision, hearing changes or sore throat Pulm: no dyspnea, cough, or wheeze CV: no chest pain or palpitations Abd: no vomiting, diarrhea, or constipation. Nausea, occasional radiation of rt flank pain to RLQ GU: no increased urinary frequency, or urgency. Dysuria, gross hematuria, decreased UOP  Ext: no leg edema Neuro: no focal weakness, numbness, or tingling, no vision change or hearing loss Skin: no rash, no wounds MSK: No muscle spasm, no deformity, no red, hot, or swollen joint Heme: No easy bruising or bleeding Travel history: No recent long distant travel    Allergy:  Allergies  Allergen Reactions  . Digoxin And Related Nausea And Vomiting    Elevated 'white count'  . Toradol [Ketorolac Tromethamine] Other (See Comments)    Uncontrollable 'shakes'  . Latex Hives and Swelling  . Other Other (See Comments)    Any nasal sprays cause serious migranes  . Tape Itching and Rash    Please use "paper" tape    Past Medical History  Diagnosis Date  . Fibromyalgia   . DDD (degenerative disc  disease), cervical   . Migraine   . WPW (Wolff-Parkinson-White syndrome)   . Depression   . Anxiety   . Chiari malformation   . Scoliosis   . Degenerative disc disease, lumbar   . Hyperlipemia   . Allergic  rhinitis     Past Surgical History  Procedure Laterality Date  . Knee surgery Bilateral     Right Knee x 1 - Left x3  . Cesarean section    . Tubal ligation    . Eye surgery Bilateral   . Ears Bilateral   . Hernia repair    . Vein removed Left     arm  . Herneated bowel repair      Social History:  reports that she has been smoking Cigarettes.  She has been smoking about 0.25 packs per day. She has never used smokeless tobacco. She reports that she does not drink alcohol or use illicit drugs.  Family History:  Family History  Problem Relation Age of Onset  . Migraines Mother   . CAD Father   . Migraines Daughter      Prior to Admission medications   Medication Sig Start Date End Date Taking? Authorizing Provider  busPIRone (BUSPAR) 15 MG tablet Take 15 mg by mouth 2 (two) times daily.   Yes Historical Provider, MD  Butalbital-APAP-Caffeine (FIORICET) 50-300-40 MG CAPS Take 1-2 tablets by mouth every 8 (eight) hours as needed (for migraine).   Yes Historical Provider, MD  clonazePAM (KLONOPIN) 1 MG tablet Take 0.5-1 mg by mouth 2 (two) times daily. TAKES 0.5MG  IN AM AND  IN PM   Yes Historical Provider, MD  hydrOXYzine (VISTARIL) 25 MG capsule Take 25 mg by mouth daily as needed for anxiety.  04/09/15  Yes Historical Provider, MD  levonorgestrel (MIRENA) 20 MCG/24HR IUD 1 each by Intrauterine route once.   Yes Historical Provider, MD  Oxycodone HCl 10 MG TABS Take 10 mg by mouth 4 (four) times daily. 02/18/15  Yes Historical Provider, MD  tiZANidine (ZANAFLEX) 4 MG tablet Take 4 mg by mouth every 8 (eight) hours. 02/18/15  Yes Historical Provider, MD  diclofenac sodium (VOLTAREN) 1 % GEL Apply 2 g topically 4 (four) times daily. 03/01/15   Shawn C Joy, PA-C  ibuprofen (ADVIL,MOTRIN) 800 MG tablet Take 1 tablet (800 mg total) by mouth 3 (three) times daily. 03/01/15   Anselm Pancoast, PA-C    Physical Exam: Filed Vitals:   04/20/15 1845 04/20/15 1900 04/20/15 1915 04/20/15 1930   BP: 123/79 144/91 108/81 107/78  Pulse: 62 80 67 68  Temp:      Resp:      Height:      Weight:      SpO2: 99% 99% 98% 99%   General: Not in acute distress, though obvious discomfort  HEENT:       Eyes: PERRL, EOMI, no scleral icterus or conjunctival pallor.       ENT: No discharge from the ears or nose, no pharyngeal ulcers, petechiae or exudate, no tonsillar enlargement.        Neck: No JVD, no bruit, no appreciable mass Heme: No cervical adenopathy, no pallor Cardiac: S1/S2, RRR, No murmurs, No gallops or rubs. Pulm: Good air movement bilaterally. No rales, wheezing, rhonchi or rubs. Abd: Soft, nondistended, mild tenderness in lower quadrants, no rebound pain or gaurding, no mass or organomegaly, BS present. Ext: No LE edema bilaterally. 2+DP/PT pulse bilaterally. Musculoskeletal: No gross deformity, no red, hot, swollen joints  Skin:  No rashes or wounds on exposed surfaces  Neuro: Alert, oriented X3, cranial nerves II-XII grossly intact. No focal findings Psych: Patient is not overtly psychotic, denies suicidal or homocidal ideation, no active hallucinations.  Labs on Admission:  Basic Metabolic Panel: No results for input(s): NA, K, CL, CO2, GLUCOSE, BUN, CREATININE, CALCIUM, MG, PHOS in the last 168 hours. Liver Function Tests: No results for input(s): AST, ALT, ALKPHOS, BILITOT, PROT, ALBUMIN in the last 168 hours. No results for input(s): LIPASE, AMYLASE in the last 168 hours. No results for input(s): AMMONIA in the last 168 hours. CBC:  Recent Labs Lab 04/20/15 1308  WBC 7.9  HGB 13.7  HCT 42.5  MCV 90.6  PLT 211   Cardiac Enzymes: No results for input(s): CKTOTAL, CKMB, CKMBINDEX, TROPONINI in the last 168 hours.  BNP (last 3 results) No results for input(s): BNP in the last 8760 hours.  ProBNP (last 3 results) No results for input(s): PROBNP in the last 8760 hours.  CBG: No results for input(s): GLUCAP in the last 168 hours.  Radiological Exams on  Admission: Ct Abdomen Pelvis Wo Contrast  04/20/2015  CLINICAL DATA:  History of kidney stones. Right flank pain since last night EXAM: CT ABDOMEN AND PELVIS WITHOUT CONTRAST TECHNIQUE: Multidetector CT imaging of the abdomen and pelvis was performed following the standard protocol without IV contrast. COMPARISON:  CT, 06/14/2013. FINDINGS: Lung bases:  Clear.  Heart normal in size. Hepatobiliary: Calcification in the right liver lobe consistent with healed granuloma, stable. Liver otherwise unremarkable. Gallbladder surgically absent. No bile duct dilation. Spleen, pancreas, adrenal glands:  Normal. Kidneys, ureters, bladder: 8 mm stone at the right ureteropelvic junction causes mild right hydronephrosis and mild perinephric and peripelvic stranding. Small nonobstructing stone in the upper pole the right kidney. Several small nonobstructing stones in the left kidney. No renal masses. No left hydronephrosis. Normal left ureter. No other ureteral stones. Bladder is unremarkable. Uterus and adnexa:  Unremarkable. Lymph nodes:  No adenopathy. Ascites: None. Gastrointestinal:  Unremarkable.  Normal appendix visualized. Musculoskeletal:  No significant abnormality. IMPRESSION: 1. 8 mm stone at the right ureteropelvic junction causes mild right hydronephrosis and perinephric/peripelvic stranding. 2. No other acute findings. 3. Small nonobstructing stones in each kidney. Electronically Signed   By: Amie Portland M.D.   On: 04/20/2015 18:09    EKG:  Ordered and pending   Assessment/Plan  1. Obstructing right UPJ stone  - History of recurrent stones, 5 prior surgical interventions, all in [place], primarily involving Rt kidney, most recent was 2012  - Now 1 wk of progressive sxs, 8 mm obstructing right UPJ on CT, mild hydronephrosis, perinephric stranding, and b/l non-obstructing calculi also noted   - Dr. Sherryl Barters of urology consulted from the ED, requesting admission to [facility]  - Initiate Flomax at 0.4 mg now -  Pain-control with APAP, Norco, Dilaudid prn  - Treat associated UTI as below  - Will admit to [facility], follow-up on urology recommendations    2. UTI  - Bacteruria on UA with moderate leukocytes, blood, TNTC wbc - Empiric Cipro initiated in ED, will continue empirically while awaiting culture data   - Urine culture incubating, will follow  3. Fibromyalgia, chronic pain - Narcotic-dependent, followed by pain-management physician on oxycodone 10 mg q6h prn at home  - Using IV narcotic cautiously for now for acute pain a/w obstructing stone - Will return to home regimen as acute process resolves  4. Depression, anxiety  - Appears to be stable at this  time  - Continue Buspar, Klonopin  - Monitor    5. WPW syndrome  - Asymptomatic, not on antiarrhythmic   - Rate is controlled, EKG pending  - Monitor    6. Tobacco abuse  - Counseled toward cessation  - RN to provide smoking cessation information prior to discharge  - Nicotine patch available prn     DVT ppx: SQ Heparin     Code Status: Full code Family Communication: None at bed side.              Disposition Plan: Admit to inpatient   Date of Service 04/20/2015    Briscoe Deutscher, MD [facility] Pager [phone]  If 7PM-7AM, please contact night-coverage [email or web address] Password [facility] 04/20/2015, 7:57 PM

## 2015-04-20 NOTE — ED Provider Notes (Signed)
CSN: [id number]     Arrival date & time 04/20/15  1152 History   First MD Initiated Contact with Patient 04/20/15 1603     Chief Complaint  Patient presents with  . Nephrolithiasis     (Consider location/radiation/quality/duration/timing/severity/associated sxs/prior Treatment) HPI Comments: The patient is a 40 year old female, she has a history of kidney stones, she states that she has required multiple interventions for these in the past, the most recent was in 2012 in the state of [place] where she required laser therapy and extraction. She was in good health until about one week ago which he had some pain in the right back, since that time it went away until this morning when she had increased pain, severe, stabbing, intermittent but gradually worsening associated with hematuria. There is nausea but no vomiting, no fevers, she has had some cloudy urine.  The history is provided by the patient.    Past Medical History  Diagnosis Date  . Fibromyalgia   . DDD (degenerative disc disease), cervical   . Migraine   . WPW (Wolff-Parkinson-White syndrome)   . Depression   . Anxiety   . Chiari malformation   . Scoliosis   . Degenerative disc disease, lumbar   . Hyperlipemia   . Allergic rhinitis    Past Surgical History  Procedure Laterality Date  . Knee surgery Bilateral     Right Knee x 1 - Left x3  . Cesarean section    . Tubal ligation    . Eye surgery Bilateral   . Ears Bilateral   . Hernia repair    . Vein removed Left     arm  . Herneated bowel repair     Family History  Problem Relation Age of Onset  . Migraines Mother   . CAD Father   . Migraines Daughter    Social History  Substance Use Topics  . Smoking status: Current Every Day Smoker -- 0.25 packs/day    Types: Cigarettes  . Smokeless tobacco: Never Used  . Alcohol Use: No   OB History    No data available     Review of Systems  All other systems reviewed and are negative.     Allergies   Digoxin and related; Toradol; Latex; Other; and Tape  Home Medications   Prior to Admission medications   Medication Sig Start Date End Date Taking? Authorizing Provider  busPIRone (BUSPAR) 15 MG tablet Take 15 mg by mouth 2 (two) times daily.   Yes Historical Provider, MD  Butalbital-APAP-Caffeine (FIORICET) 50-300-40 MG CAPS Take 1-2 tablets by mouth every 8 (eight) hours as needed (for migraine).   Yes Historical Provider, MD  clonazePAM (KLONOPIN) 1 MG tablet Take 0.5-1 mg by mouth 2 (two) times daily. TAKES 0.5MG  IN AM AND 1MG  IN PM   Yes Historical Provider, MD  hydrOXYzine (VISTARIL) 25 MG capsule Take 25 mg by mouth daily as needed for anxiety.  04/09/15  Yes Historical Provider, MD  levonorgestrel (MIRENA) 20 MCG/24HR IUD 1 each by Intrauterine route once.   Yes Historical Provider, MD  Oxycodone HCl 10 MG TABS Take 10 mg by mouth 4 (four) times daily. 02/18/15  Yes Historical Provider, MD  tiZANidine (ZANAFLEX) 4 MG tablet Take 4 mg by mouth every 8 (eight) hours. 02/18/15  Yes Historical Provider, MD  diclofenac sodium (VOLTAREN) 1 % GEL Apply 2 g topically 4 (four) times daily. 03/01/15   Shawn C Joy, PA-C  ibuprofen (ADVIL,MOTRIN) 800 MG tablet Take 1 tablet (800  mg total) by mouth 3 (three) times daily. 03/01/15   Shawn C Joy, PA-C   BP 124/74 mmHg  Pulse 63  Temp(Src) 98.1 F (36.7 C)  Resp 15  Ht  (1.626 m)  Wt 172 lb (78.019 kg)  BMI 29.51 kg/m2  SpO2 98%  LMP  Physical Exam  Constitutional: She appears well-developed and well-nourished. No distress.  HENT:  Head: Normocephalic and atraumatic.  Mouth/Throat: Oropharynx is clear and moist. No oropharyngeal exudate.  Eyes: Conjunctivae and EOM are normal. Pupils are equal, round, and reactive to light. Right eye exhibits no discharge. Left eye exhibits no discharge. No scleral icterus.  Neck: Normal range of motion. Neck supple. No JVD present. No thyromegaly present.  Cardiovascular: Normal rate, regular rhythm,  normal heart sounds and intact distal pulses.  Exam reveals no gallop and no friction rub.   No murmur heard. Pulmonary/Chest: Effort normal and breath sounds normal. No respiratory distress. She has no wheezes. She has no rales.  Abdominal: Soft. Bowel sounds are normal. She exhibits no distension and no mass. There is no tenderness.  Musculoskeletal: Normal range of motion. She exhibits no edema or tenderness.  Lymphadenopathy:    She has no cervical adenopathy.  Neurological: She is alert. Coordination normal.  Skin: Skin is warm and dry. No rash noted. No erythema.  Psychiatric: She has a normal mood and affect. Her behavior is normal.  Nursing note and vitals reviewed.   ED Course  Procedures (including critical care time) Labs Review Labs Reviewed  URINALYSIS, ROUTINE W REFLEX MICROSCOPIC (NOT AT [facility]) - Abnormal; Notable for the following:    APPearance CLOUDY (*)    Hgb urine dipstick LARGE (*)    Bilirubin Urine SMALL (*)    Protein, ur 30 (*)    Leukocytes, UA MODERATE (*)    All other components within normal limits  URINE MICROSCOPIC-ADD ON - Abnormal; Notable for the following:    Squamous Epithelial / LPF 0-5 (*)    Bacteria, UA FEW (*)    All other components within normal limits  URINE CULTURE  CBC  I-STAT BETA HCG BLOOD, ED ([facility], [facility], [facility] ONLY)    Imaging Review Ct Abdomen Pelvis Wo Contrast  04/20/2015  CLINICAL DATA:  History of kidney stones. Right flank pain since last night EXAM: CT ABDOMEN AND PELVIS WITHOUT CONTRAST TECHNIQUE: Multidetector CT imaging of the abdomen and pelvis was performed following the standard protocol without IV contrast. COMPARISON:  CT, 06/14/2013. FINDINGS: Lung bases:  Clear.  Heart normal in size. Hepatobiliary: Calcification in the right liver lobe consistent with healed granuloma, stable. Liver otherwise unremarkable. Gallbladder surgically absent. No bile duct dilation. Spleen, pancreas, adrenal glands:  Normal. Kidneys, ureters,  bladder: 8 mm stone at the right ureteropelvic junction causes mild right hydronephrosis and mild perinephric and peripelvic stranding. Small nonobstructing stone in the upper pole the right kidney. Several small nonobstructing stones in the left kidney. No renal masses. No left hydronephrosis. Normal left ureter. No other ureteral stones. Bladder is unremarkable. Uterus and adnexa:  Unremarkable. Lymph nodes:  No adenopathy. Ascites: None. Gastrointestinal:  Unremarkable.  Normal appendix visualized. Musculoskeletal:  No significant abnormality. IMPRESSION: 1. 8 mm stone at the right ureteropelvic junction causes mild right hydronephrosis and perinephric/peripelvic stranding. 2. No other acute findings. 3. Small nonobstructing stones in each kidney. Electronically Signed   By: Amie Portland M.D.   On: 04/20/2015 18:09   I have personally reviewed and evaluated these images and lab results  as part of my medical decision-making.    MDM   Final diagnoses:  Kidney stone    Though the patient appears mildly uncomfortable she does not have tachycardia nor does she have abdominal reproducible tenderness. I suspect that the colicky nature of her pain could be from kidney stones, check a urinalysis, CT scan of the abdomen and pelvis without contrast to evaluate for nephro or ureterolithiasis. Vital signs are unremarkable, Dilaudid ordered. I personally placed an IV in the right external jugular vein under ultrasound guidance as the nurse was unable to obtain IV access.  Angiocath insertion Performed by: Vida Roller  Consent: Verbal consent obtained. Risks and benefits: risks, benefits and alternatives were discussed Time out: Immediately prior to procedure a "time out" was called to verify the correct patient, procedure, equipment, support staff and site/side marked as required.  Preparation: Patient was prepped and draped in the usual sterile fashion.  Vein Location: R EJ  Ultrasound  Guided  Gauge: 20  Normal blood return and flush without difficulty Patient tolerance: Patient tolerated the procedure well with no immediate complications.  CT shows a nonobstructive 8 mm stone  The patient has had poor pain control with IV hydromorphone. I discussed her care with the urologist to will see her in the emergency department as well as the hospitalist who has agreed to help transfer the patient to [facility].  Meds given in ED:  Medications  HYDROmorphone (DILAUDID) injection 1 mg (1 mg Intravenous Given 04/20/15 1744)  ciprofloxacin (CIPRO) IVPB 400 mg (400 mg Intravenous New Bag/Given 04/20/15 1828)  HYDROmorphone (DILAUDID) injection 1 mg (1 mg Intravenous Given 04/20/15 1907)    New Prescriptions   No medications on file      Eber Hong, MD 04/20/15 [phone]

## 2015-04-20 NOTE — ED Notes (Signed)
Pt states hx of kidney stones.  States R flank pain and minimal urination since last night.

## 2015-04-20 NOTE — Consult Note (Addendum)
8:04 PM   Chastelyn Athens 12-29-1975 [id number]  Referring provider: Dr. Fleet Contras  Chief Complaint  Patient presents with  . Nephrolithiasis    HPI: Is a 40 year old female with a past medical history significant for fibromyalgia, migraines, and nephrolithiasis who presents with a right 7-8 mm UPJ stone with mild hydronephrosis. The patient has noted she experienced pain for 24 hours. She denies nausea, vomiting, fevers or chills. She was given Dilaudid in the emergency room but continues to complain poorly controlled pain not only in her right flank but also in other areas of her body.  She was being prepared for discharge when she complained that her diffuse pain was not adequately controlled. At this time, her vitals are stable and she has no leukocytosis. She does have a urinalysis that has white blood cells and minimal bacteria. She has no symptoms of urinary tract infection. The patient is demanding to be admitted to the hospital for social issues that include having adult children living in her home and having limited minutes available on her cell phone if her pain were to return at home. Her exam is benign at this time despite "diffuse 8 out of 10 pain."  A review of the patient's chart indicates that she takes oxycodone 10 mg four times a day at baseline for back pain.   PMH: Past Medical History  Diagnosis Date  . Fibromyalgia   . DDD (degenerative disc disease), cervical   . Migraine   . WPW (Wolff-Parkinson-White syndrome)   . Depression   . Anxiety   . Chiari malformation   . Scoliosis   . Degenerative disc disease, lumbar   . Hyperlipemia   . Allergic rhinitis     Surgical History: Past Surgical History  Procedure Laterality Date  . Knee surgery Bilateral     Right Knee x 1 - Left x3  . Cesarean section    . Tubal ligation    . Eye surgery Bilateral   . Ears Bilateral   . Hernia repair    . Vein removed Left     arm  . Herneated bowel repair      Home  Medications:    Medication List    ASK your doctor about these medications        busPIRone 15 MG tablet  Commonly known as:  BUSPAR  Take 15 mg by mouth 2 (two) times daily.     clonazePAM 1 MG tablet  Commonly known as:  KLONOPIN  Take 0.5-1 mg by mouth 2 (two) times daily. TAKES 0.5MG  IN AM AND 1MG  IN PM     diclofenac sodium 1 % Gel  Commonly known as:  VOLTAREN  Apply 2 g topically 4 (four) times daily.     FIORICET 50-300-40 MG Caps  Generic drug:  Butalbital-APAP-Caffeine  Take 1-2 tablets by mouth every 8 (eight) hours as needed (for migraine).     hydrOXYzine 25 MG capsule  Commonly known as:  VISTARIL  Take 25 mg by mouth daily as needed for anxiety.     ibuprofen 800 MG tablet  Commonly known as:  ADVIL,MOTRIN  Take 1 tablet (800 mg total) by mouth 3 (three) times daily.     levonorgestrel 20 MCG/24HR IUD  Commonly known as:  MIRENA  1 each by Intrauterine route once.     Oxycodone HCl 10 MG Tabs  Take 10 mg by mouth 4 (four) times daily.     tiZANidine 4 MG tablet  Commonly known as:  ZANAFLEX  Take 4 mg by mouth every 8 (eight) hours.        Allergies:  Allergies  Allergen Reactions  . Digoxin And Related Nausea And Vomiting    Elevated 'white count'  . Toradol [Ketorolac Tromethamine] Other (See Comments)    Uncontrollable 'shakes'  . Latex Hives and Swelling  . Other Other (See Comments)    Any nasal sprays cause serious migranes  . Tape Itching and Rash    Please use "paper" tape    Family History: Family History  Problem Relation Age of Onset  . Migraines Mother   . CAD Father   . Migraines Daughter     Social History:  reports that she has been smoking Cigarettes.  She has been smoking about 0.25 packs per day. She has never used smokeless tobacco. She reports that she does not drink alcohol or use illicit drugs.  ROS: 12 point ROS otherwise negative except for HPI                                         Physical Exam: BP 107/78 mmHg  Pulse 68  Temp(Src) 98.1 F (36.7 C)  Resp 15  Ht  (1.626 m)  Wt 172 lb (78.019 kg)  BMI 29.51 kg/m2  SpO2 99%  LMP   Constitutional:  Alert and oriented, No acute distress. HEENT: [place] AT, moist mucus membranes.  Trachea midline, no masses. Cardiovascular: No clubbing, cyanosis, or edema. Respiratory: Normal respiratory effort, no increased work of breathing. GI: Abdomen is soft, nontender, nondistended, no abdominal masses GU: No CVA tenderness appreciated on exam despite vigorous palpation. Skin: No rashes, bruises or suspicious lesions. Lymph: No cervical or inguinal adenopathy. Neurologic: Grossly intact, no focal deficits, moving all 4 extremities. Psychiatric: Normal mood and affect.  Laboratory Data: Lab Results  Component Value Date   WBC 7.9 04/20/2015   HGB 13.7 04/20/2015   HCT 42.5 04/20/2015   MCV 90.6 04/20/2015   PLT 211 04/20/2015    Lab Results  Component Value Date   CREATININE 0.74 10/14/2014    No results found for: PSA  No results found for: TESTOSTERONE  No results found for: HGBA1C  Urinalysis    Component Value Date/Time   COLORURINE YELLOW 04/20/2015 1651   APPEARANCEUR CLOUDY* 04/20/2015 1651   LABSPEC 1.023 04/20/2015 1651   PHURINE 5.5 04/20/2015 1651   GLUCOSEU NEGATIVE 04/20/2015 1651   HGBUR LARGE* 04/20/2015 1651   BILIRUBINUR SMALL* 04/20/2015 1651   KETONESUR NEGATIVE 04/20/2015 1651   PROTEINUR 30* 04/20/2015 1651   UROBILINOGEN 0.2 06/14/2013 1447   NITRITE NEGATIVE 04/20/2015 1651   LEUKOCYTESUR MODERATE* 04/20/2015 1651    Pertinent Imaging: CLINICAL DATA: History of kidney stones. Right flank pain since last night  EXAM: CT ABDOMEN AND PELVIS WITHOUT CONTRAST  TECHNIQUE: Multidetector CT imaging of the abdomen and pelvis was performed following the standard protocol without IV contrast.  COMPARISON: CT, 06/14/2013.  FINDINGS: Lung bases: Clear. Heart normal  in size.  Hepatobiliary: Calcification in the right liver lobe consistent with healed granuloma, stable. Liver otherwise unremarkable. Gallbladder surgically absent. No bile duct dilation.  Spleen, pancreas, adrenal glands: Normal.  Kidneys, ureters, bladder: 8 mm stone at the right ureteropelvic junction causes mild right hydronephrosis and mild perinephric and peripelvic stranding. Small nonobstructing stone in the upper pole the right kidney. Several small nonobstructing stones in the left kidney.  No renal masses. No left hydronephrosis. Normal left ureter. No other ureteral stones. Bladder is unremarkable.  Uterus and adnexa: Unremarkable.  Lymph nodes: No adenopathy.  Ascites: None.  Gastrointestinal: Unremarkable. Normal appendix visualized.  Musculoskeletal: No significant abnormality.  IMPRESSION: 1. 8 mm stone at the right ureteropelvic junction causes mild right hydronephrosis and perinephric/peripelvic stranding. 2. No other acute findings. 3. Small nonobstructing stones in each kidney.  Assessment & Plan:    1. Right proximal ureteral stone The patient does have a right ureteral stone but based on her exam findings I think that she could be discharged home at this time. She however is insisting on admission overnight to the hospital due social issues and has been admitted to the hospitalist service. I think the goal should be to transition her to oral pain medication with the plan to discharge in the morning with outpatient stone follow-up. I would prophylactically start antibiotics due to her urinalysis though she shows no signs of sepsis at this time. Okay for discharge home in the morning from a urology perspective. Please call with questions or if the patient's status changes. Her stone is visible on scout film so think it would be amenable to lithotripsy on outpatient follow-up.  Hildred Laser, MD

## 2015-04-20 NOTE — ED Notes (Signed)
Patient refused to have vitals retaken; patient stated "I asked to speak to a nursing supervisor or a charge nurse and no one has showed up; informed patient that Minerva Areola, AD will come and speak with her

## 2015-04-21 ENCOUNTER — Ambulatory Visit: Payer: Medicaid Other | Admitting: Neurology

## 2015-04-21 LAB — BASIC METABOLIC PANEL
Anion gap: 7 (ref 5–15)
BUN: 7 mg/dL (ref 6–20)
CALCIUM: 8.5 mg/dL — AB (ref 8.9–10.3)
CO2: 22 mmol/L (ref 22–32)
CREATININE: 0.55 mg/dL (ref 0.44–1.00)
Chloride: 110 mmol/L (ref 101–111)
GFR calc Af Amer: >60 mL/min (ref >60)
GLUCOSE: 84 mg/dL (ref 65–99)
POTASSIUM: 3.7 mmol/L (ref 3.5–5.1)
Sodium: 139 mmol/L (ref 135–145)

## 2015-04-21 LAB — GLUCOSE, CAPILLARY: Glucose-Capillary: 80 mg/dL (ref 65–99)

## 2015-04-21 MED ORDER — OXYCODONE HCL 5 MG PO TABS
10.0000 mg | ORAL_TABLET | Freq: Four times a day (QID) | ORAL | Status: DC
  Administered 2015-04-21: 10 mg via ORAL
  Filled 2015-04-21: qty 2

## 2015-04-21 MED ORDER — OXYCODONE HCL 5 MG PO TABS
10.0000 mg | ORAL_TABLET | Freq: Four times a day (QID) | ORAL | Status: DC
  Administered 2015-04-21 – 2015-04-22 (×4): 10 mg via ORAL
  Filled 2015-04-21 (×4): qty 2

## 2015-04-21 MED ORDER — IBUPROFEN 200 MG PO TABS: 600.0000 mg | ORAL_TABLET | Freq: Four times a day (QID) | ORAL | Status: DC | PRN

## 2015-04-21 MED ORDER — SODIUM CHLORIDE 0.9 % IV SOLN: INTRAVENOUS | Status: DC

## 2015-04-21 NOTE — Progress Notes (Signed)
Patient called out for pain medication and norco was offered and refused, ibuprofen offered and refused as well, MD Vann notified early this morning and just now regarding patient's request for dilaudid since she says that is the only medication that works for her pain, MD notfied awaiting callback, patient's mother called requesting to speak with supervisor will notify directors Stanford Breed RN 04-21-2015 11:59 AM

## 2015-04-21 NOTE — Progress Notes (Signed)
Nutrition Brief Note  Patient identified on the Malnutrition Screening Tool (MST) Report  Patient's weight has remained stable over the last year. Pt is eating.  Wt Readings from Last 15 Encounters:  04/20/15 172 lb (78.019 kg)  07/12/14 173 lb (78.472 kg)  03/26/14 170 lb (77.111 kg)  02/03/14 170 lb (77.111 kg)  01/27/14 171 lb (77.565 kg)  12/24/13 170 lb (77.111 kg)  11/27/13 171 lb (77.565 kg)  08/26/13 173 lb (78.472 kg)  06/20/13 176 lb 12.8 oz (80.196 kg)  06/17/13 175 lb 9.6 oz (79.652 kg)  02/12/13 181 lb 1 oz (82.129 kg)  12/16/12 184 lb 4.8 oz (83.598 kg)  09/12/12 190 lb 3.2 oz (86.274 kg)    Body mass index is 29.51 kg/(m^2). Patient meets criteria for overweight based on current BMI.   Current diet order is regular, patient is consuming approximately 100% of meals at this time. Labs and medications reviewed.   No nutrition interventions warranted at this time. If nutrition issues arise, please consult RD.   Tilda Franco, MS, RD, LDN Pager: [phone] After Hours Pager: [phone]

## 2015-04-21 NOTE — Progress Notes (Signed)
Spoke with patient regarding pain control concerns.  Reviewed current medication orders with patient and encouraged her to try the Norco and Robaxin as ordered by Dr Benjamine Mola.  Explained that no dilaudid orders were being administered at this time.  Pt upset about dilaudid not being ordered as she feels like dilaudid is the only thing that will relieve her pain.  Pt agreed to take the Norco and Robaxin.  Pt shared that she slammed her left pointer finger in the bathroom door this am.  She is complaining of pain to her pointer finger.  Patient requesting different hospitalist.  MD made aware of all of the above and patient will have different MD tomorrow am.   Per patient request this RN spoke to pts mother.  Mother updated on plan for pain control. Mother expressed concern about her daughters pain.  Mother shared that patient had "extensive issues" with kidney stones in 2012.  She was a patient in [facility] in [place].
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
## 2015-04-21 NOTE — Progress Notes (Signed)
Donnamarie Poag called about pt's request to see on call doctor- pt unhappy about pain med orders and crying.

## 2015-04-21 NOTE — Progress Notes (Signed)
PROGRESS NOTE  Brooke [name] ZOX:[id number] DOB: Feb 25, 1975 DOA: 04/20/2015 PCP: Jackie Plum, MD  Assessment/Plan: Kidney stone 7-94mm UPJ stone -IVF -will avoid IV Pain med -patient is not tachy and tears stop when she answers questions -continue home oxycodone -ibuprofen-- patient does not tolerate Toradol but can take ibuprofen (patient says her pain clinic took her off ibuprofen due to "liver issues") -patient ran out of her pain medicine 3 days ago because she missed her appointment -cipro -flomax -have reviewed old records and have found multiple recordsof patient coming to hospital specifically for dilaudid- with complaints ranging from HA to chest pain  Fibromyalgia -goes to pain clinic -ran out of meds    Code Status: full Family Communication: patient Disposition Plan:    Consultants:    Procedures:      HPI/Subjective: Says she never can have same day surgery as she always has complications Ran out of pain medications at home 3 days ago  Objective: Filed Vitals:   04/20/15 2127 04/21/15 0543  BP: 122/79 96/55  Pulse: 73 52  Temp: 98.1 F (36.7 C) 98.6 F (37 C)  Resp: 16 16    Intake/Output Summary (Last 24 hours) at 04/21/15 1010 Last data filed at 04/21/15 0543  Gross per 24 hour  Intake 916.67 ml  Output      0 ml  Net 916.67 ml   Filed Weights   04/20/15 1259  Weight: 78.019 kg (172 lb)    Exam:   General:  Awake  Cardiovascular: rrr  Respiratory: clear    Data Reviewed: Basic Metabolic Panel:  Recent Labs Lab 04/21/15 0755  NA 139  K 3.7  CL 110  CO2 22  GLUCOSE 84  BUN 7  CREATININE 0.55  CALCIUM 8.5*   Liver Function Tests: No results for input(s): AST, ALT, ALKPHOS, BILITOT, PROT, ALBUMIN in the last 168 hours. No results for input(s): LIPASE, AMYLASE in the last 168 hours. No results for input(s): AMMONIA in the last 168 hours. CBC:  Recent Labs Lab 04/20/15 1308  WBC 7.9  HGB 13.7  HCT 42.5   MCV 90.6  PLT 211   Cardiac Enzymes: No results for input(s): CKTOTAL, CKMB, CKMBINDEX, TROPONINI in the last 168 hours. BNP (last 3 results) No results for input(s): BNP in the last 8760 hours.  ProBNP (last 3 results) No results for input(s): PROBNP in the last 8760 hours.  CBG:  Recent Labs Lab 04/21/15 0811  GLUCAP 80    No results found for this or any previous visit (from the past 240 hour(s)).   Studies: Ct Abdomen Pelvis Wo Contrast  04/20/2015  CLINICAL DATA:  History of kidney stones. Right flank pain since last night EXAM: CT ABDOMEN AND PELVIS WITHOUT CONTRAST TECHNIQUE: Multidetector CT imaging of the abdomen and pelvis was performed following the standard protocol without IV contrast. COMPARISON:  CT, 06/14/2013. FINDINGS: Lung bases:  Clear.  Heart normal in size. Hepatobiliary: Calcification in the right liver lobe consistent with healed granuloma, stable. Liver otherwise unremarkable. Gallbladder surgically absent. No bile duct dilation. Spleen, pancreas, adrenal glands:  Normal. Kidneys, ureters, bladder: 8 mm stone at the right ureteropelvic junction causes mild right hydronephrosis and mild perinephric and peripelvic stranding. Small nonobstructing stone in the upper pole the right kidney. Several small nonobstructing stones in the left kidney. No renal masses. No left hydronephrosis. Normal left ureter. No other ureteral stones. Bladder is unremarkable. Uterus and adnexa:  Unremarkable. Lymph nodes:  No adenopathy. Ascites: None. Gastrointestinal:  Unremarkable.  Normal appendix visualized. Musculoskeletal:  No significant abnormality. IMPRESSION: 1. 8 mm stone at the right ureteropelvic junction causes mild right hydronephrosis and perinephric/peripelvic stranding. 2. No other acute findings. 3. Small nonobstructing stones in each kidney. Electronically Signed   By: Amie Portland M.D.   On: 04/20/2015 18:09    Scheduled Meds: . busPIRone  15 mg Oral BID  .  ciprofloxacin  400 mg Intravenous Q12H  . clonazePAM  0.5 mg Oral Daily  . clonazePAM  1 mg Oral QHS  . heparin  5,000 Units Subcutaneous 3 times per day  . oxyCODONE  10 mg Oral Q6H  . tamsulosin  0.4 mg Oral Daily   Continuous Infusions: . sodium chloride 125 mL/hr at 04/21/15 1610   Antibiotics Given (last 72 hours)    Date/Time Action Medication Dose Rate   04/21/15 0551 Given   ciprofloxacin (CIPRO) IVPB 400 mg 400 mg 200 mL/hr      Principal Problem:   Obstruction of right ureteropelvic junction due to stone Active Problems:   WPW syndrome   Tobacco abuse   Migraine   UTI (lower urinary tract infection)   Fibromyalgia   Chronic pain disorder    Time spent: 25 min    Sadia Belfiore U [facility]  [facility] Pager [phone]. If 7PM-7AM, please contact night-coverage at [email or web address], password [facility] 04/21/2015, 10:10 AM  LOS: 1 day

## 2015-04-22 LAB — URINE CULTURE: SPECIAL REQUESTS: NORMAL

## 2015-04-22 LAB — GLUCOSE, CAPILLARY: GLUCOSE-CAPILLARY: 102 mg/dL — AB (ref 65–99)

## 2015-04-22 MED ORDER — METHOCARBAMOL 500 MG PO TABS: 500.0000 mg | ORAL_TABLET | Freq: Four times a day (QID) | ORAL | Status: DC | PRN

## 2015-04-22 MED ORDER — OXYCODONE HCL 10 MG PO TABS: 10.0000 mg | ORAL_TABLET | Freq: Four times a day (QID) | ORAL | Status: DC

## 2015-04-22 MED ORDER — TAMSULOSIN HCL 0.4 MG PO CAPS
0.4000 mg | ORAL_CAPSULE | Freq: Every day | ORAL | Status: DC
Start: 2015-04-22 — End: 2015-04-25

## 2015-04-22 MED ORDER — CIPROFLOXACIN HCL 500 MG PO TABS: 500.0000 mg | ORAL_TABLET | Freq: Two times a day (BID) | ORAL | Status: DC

## 2015-04-22 MED ORDER — INFLUENZA VAC SPLIT QUAD 0.5 ML IM SUSY: 0.5000 mL | PREFILLED_SYRINGE | INTRAMUSCULAR | Status: DC

## 2015-04-22 MED ORDER — CIPROFLOXACIN HCL 500 MG PO TABS
500.0000 mg | ORAL_TABLET | Freq: Two times a day (BID) | ORAL | Status: DC
  Administered 2015-04-22: 500 mg via ORAL
  Filled 2015-04-22 (×4): qty 1

## 2015-04-22 NOTE — Progress Notes (Signed)
Wells Guiles, nsg supervisor, in per pt and pt's mother's requests.  Marshall listened to concerns and instructed them that she would contact the hospitalist on call and would relay pt's concerns.  Pt's RN and  nsg supervisor spoke with Donnamarie Poag, NP by phone

## 2015-04-22 NOTE — Discharge Summary (Signed)
Physician Discharge Summary  Brooke [name] ZOX:[id number] DOB: Jul 28, 1975 DOA: 04/20/2015  PCP: Jackie Plum, MD  Admit date: 04/20/2015 Discharge date: 04/22/2015  Time spent: 35 minutes  Recommendations for Outpatient:  1. During this hospitalization Brooke [name] had expressed disagreement regarding pain regimen. She requested IV Dilaudid and became angry upon the discontinuation of this medication during this hospitalization. On 04/22/2015 she was tolerating by mouth intake, completed 100% of her breakfast, stable abdominal exam, was discharged on oral narcotic analgesics. 2. She was given a script for 4 tablets of Oxycodone 10 mg on discharge. She has a follow up appointment at her pain management center on 04/23/2015.  3. She was given a follow-up appointment with [facility]   Discharge Diagnoses:  Principal Problem:   Obstruction of right ureteropelvic junction due to stone Active Problems:   WPW syndrome   Tobacco abuse   Migraine   UTI (lower urinary tract infection)   Fibromyalgia   Chronic pain disorder   Discharge Condition: Stable  Diet recommendation: Regular diet  Filed Weights   04/20/15 1259  Weight: 78.019 kg (172 lb)    History of present illness:  Brooke [name] is a 40 y.o. female with PMH of bilateral nephrolithiasis requiring multiple surgical interventions, depression and anxiety, fibromyalgia, and Wolff-Parkinson-White syndrome who presents to the ED with 1 week of progressive right flank pain with nausea and gross hematuria. Patient reports being in her usual state of health until 04/13/2015 when she awoke with acute right flank pain. Pain waxed and waned over the course of 2-3 days and eventually resolved until she again woke with acute right flank pain on the morning of admission. She describes the pain as 10/10 in intensity, "achy" and "pressure" in character, occasionally radiating to the right lower quadrant, associated with nausea but no  vomiting, and with concomitant development of gross hematuria. Pain is exacerbated with urination and no alleviating factors have been identified by the patient. Symptoms are very similar to those experienced in the past with obstructing renal calculi. Patient had previously resided in [place], where she reportedly underwent 5 extraction procedures, mainly directed at the right kidney, and with the most recent in 2012. She has not established with a urologist in [place]. She denies any fevers or chills, chest pain or palpitations, headaches or lightheadedness.  In ED, patient was found to be afebrile, saturating well on room air, and with vital signs stable. CBC was obtained and normal. Urinalysis features few bacteria, large hemoglobin, moderate leukocytes, negative nitrite, and too numerous to count white blood cells. CT of the abdomen and pelvis is obtained in the ED and demonstrates an 8 mm obstructing calculus at the right UPJ. There is associated mild right hydronephrosis and perinephric stranding. Also noted on CT are multiple, nonobstructing stones bilaterally. Dr. Sherryl Barters of urology was consulted from the emergency department and has requested her admission to [facility]. Patient remained hemodynamically stable in the emergency department, was treated empirically with a dose of IV ciprofloxacin, and will be admitted to [facility] for ongoing evaluation and management of obstructing stone at the right UPJ.  Hospital Course:  Patient is a 40 year old with a past medical history of bilateral nephrolithiasis, 5 myalgia, chronic pain syndrome, history of narcotic seeking behavior, admitted to the medicine service on 04/20/2015 when she presented with complaints of right flank pain. She was worked up with a CT scan of abdomen and pelvis that revealed a right proximal ureteral stone. She was seen and evaluated by  Dr. Sherryl Barters of urology who felt patient was stable for discharge home at this  time without further recommendations for urologic procedures during this hospitalization. On  04/21/2015 IV was discontinued and she was transitioned to oral narcotic analgesia. She had been tolerating by mouth intake, remained hemodynamically stable, and her complaints appear to be out of proportion to physical exam findings. Patient expressed anger towards his transition requesting IV Dilaudid to continue. On my assessment on 04/22/2015 she appeared comfortable, in no acute distress, completed 100% of her breakfast. Physical exam findings unremarkable. She was discharged home in stable condition and given outpatient follow-up with [facility] along with her pain management Center. She was given a prescription for 4 tablets of oxycodone 10 mg. appointment with pain center scheduled for 04/23/2015.  Consultations:  Urology  Discharge Exam: Filed Vitals:   04/21/15 2200 04/22/15 0523  BP: 114/65 102/58  Pulse: 68 60  Temp: 98.3 F (36.8 C) 98.1 F (36.7 C)  Resp: 16 18    General: Nontoxic appearing, awake and alert, appears angry/frustrated Cardiovascular: Regular rate and rhythm normal S1-S2 no murmurs rubs or gallops Respiratory: Normal respiratory effort, lungs are clear to auscultation bilaterally Abdomen: She had a benign abdominal examination, she did not have pain to palpation across generalized abdomen no rebound tenderness or guarding  Discharge Instructions   Discharge Instructions    Call MD for:  difficulty breathing, headache or visual disturbances    Complete by:  As directed      Call MD for:  extreme fatigue    Complete by:  As directed      Call MD for:  hives    Complete by:  As directed      Call MD for:  persistant dizziness or light-headedness    Complete by:  As directed      Call MD for:  persistant nausea and vomiting    Complete by:  As directed      Call MD for:  redness, tenderness, or signs of infection (pain, swelling, redness, odor or  green/yellow discharge around incision site)    Complete by:  As directed      Call MD for:  severe uncontrolled pain    Complete by:  As directed      Call MD for:  temperature >100.4    Complete by:  As directed      Call MD for:    Complete by:  As directed      Diet - low sodium heart healthy    Complete by:  As directed      Increase activity slowly    Complete by:  As directed           Current Discharge Medication List    START taking these medications   Details  ciprofloxacin (CIPRO) 500 MG tablet Take 1 tablet (500 mg total) by mouth 2 (two) times daily. Qty: 6 tablet, Refills: 0    methocarbamol (ROBAXIN) 500 MG tablet Take 1 tablet (500 mg total) by mouth every 6 (six) hours as needed for muscle spasms. Qty: 10 tablet, Refills: 0    tamsulosin (FLOMAX) 0.4 MG CAPS capsule Take 1 capsule (0.4 mg total) by mouth daily. Qty: 7 capsule, Refills: 0      CONTINUE these medications which have CHANGED   Details  Oxycodone HCl 10 MG TABS Take 1 tablet (10 mg total) by mouth 4 (four) times daily. Qty: 4 tablet, Refills: 0      CONTINUE these  medications which have NOT CHANGED   Details  busPIRone (BUSPAR) 15 MG tablet Take 15 mg by mouth 2 (two) times daily.    Butalbital-APAP-Caffeine (FIORICET) 50-300-40 MG CAPS Take 1-2 tablets by mouth every 8 (eight) hours as needed (for migraine).    clonazePAM (KLONOPIN) 1 MG tablet Take 0.5-1 mg by mouth 2 (two) times daily. TAKES 0.5MG  IN AM AND  IN PM    hydrOXYzine (VISTARIL) 25 MG capsule Take 25 mg by mouth daily as needed for anxiety.  Refills: 0    levonorgestrel (MIRENA) 20 MCG/24HR IUD 1 each by Intrauterine route once.    diclofenac sodium (VOLTAREN) 1 % GEL Apply 2 g topically 4 (four) times daily. Qty: 100 g, Refills: 0    ibuprofen (ADVIL,MOTRIN) 800 MG tablet Take 1 tablet (800 mg total) by mouth 3 (three) times daily. Qty: 21 tablet, Refills: 0      STOP taking these medications     tiZANidine  (ZANAFLEX) 4 MG tablet        Allergies  Allergen Reactions  . Digoxin And Related Nausea And Vomiting    Elevated 'white count'  . Toradol [Ketorolac Tromethamine] Other (See Comments)    Uncontrollable 'shakes'  . Latex Hives and Swelling  . Other Other (See Comments)    Any nasal sprays cause serious migranes  . Tape Itching and Rash    Please use "paper" tape   Follow-up Information    Follow up with [facility].   Why:  Pt has appointment 3/2 @ 1pm   Contact information:   [address] [place] [address] [phone]       Follow up with [facility] Schedule an appointment as soon as possible for a visit today.   Why:  Robin the RN will call you by 5pm today to advise of appt time after she gets approval from your primary physician   Contact information:   [address] [place] [address] [phone]        The results of significant diagnostics from this hospitalization (including imaging, microbiology, ancillary and laboratory) are listed below for reference.    Significant Diagnostic Studies: Ct Abdomen Pelvis Wo Contrast  04/20/2015  CLINICAL DATA:  History of kidney stones. Right flank pain since last night EXAM: CT ABDOMEN AND PELVIS WITHOUT CONTRAST TECHNIQUE: Multidetector CT imaging of the abdomen and pelvis was performed following the standard protocol without IV contrast. COMPARISON:  CT, 06/14/2013. FINDINGS: Lung bases:  Clear.  Heart normal in size. Hepatobiliary: Calcification in the right liver lobe consistent with healed granuloma, stable. Liver otherwise unremarkable. Gallbladder surgically absent. No bile duct dilation. Spleen, pancreas, adrenal glands:  Normal. Kidneys, ureters, bladder: 8 mm stone at the right ureteropelvic junction causes mild right hydronephrosis and mild perinephric and peripelvic stranding. Small nonobstructing stone in the upper pole the right kidney. Several small  nonobstructing stones in the left kidney. No renal masses. No left hydronephrosis. Normal left ureter. No other ureteral stones. Bladder is unremarkable. Uterus and adnexa:  Unremarkable. Lymph nodes:  No adenopathy. Ascites: None. Gastrointestinal:  Unremarkable.  Normal appendix visualized. Musculoskeletal:  No significant abnormality. IMPRESSION: 1. 8 mm stone at the right ureteropelvic junction causes mild right hydronephrosis and perinephric/peripelvic stranding. 2. No other acute findings. 3. Small nonobstructing stones in each kidney. Electronically Signed   By: Amie Portland M.D.   On: 04/20/2015 18:09    Microbiology: Recent Results (from the past 240  hour(s))  Urine culture     Status: None (Preliminary result)   Collection Time: 04/20/15  4:51 PM  Result Value Ref Range Status   Specimen Description URINE, CLEAN CATCH  Final   Special Requests Normal  Final   Culture TOO YOUNG TO READ  Final   Report Status PENDING  Incomplete     Labs: Basic Metabolic Panel:  Recent Labs Lab 04/21/15 0755  NA 139  K 3.7  CL 110  CO2 22  GLUCOSE 84  BUN 7  CREATININE 0.55  CALCIUM 8.5*   Liver Function Tests: No results for input(s): AST, ALT, ALKPHOS, BILITOT, PROT, ALBUMIN in the last 168 hours. No results for input(s): LIPASE, AMYLASE in the last 168 hours. No results for input(s): AMMONIA in the last 168 hours. CBC:  Recent Labs Lab 04/20/15 1308  WBC 7.9  HGB 13.7  HCT 42.5  MCV 90.6  PLT 211   Cardiac Enzymes: No results for input(s): CKTOTAL, CKMB, CKMBINDEX, TROPONINI in the last 168 hours. BNP: BNP (last 3 results) No results for input(s): BNP in the last 8760 hours.  ProBNP (last 3 results) No results for input(s): PROBNP in the last 8760 hours.  CBG:  Recent Labs Lab 04/21/15 0811 04/22/15 0729  GLUCAP 80 102*       Signed:  Jeralyn Bennett MD.  [facility] 04/22/2015, 10:30 AM

## 2015-04-22 NOTE — Progress Notes (Addendum)
Pt's ext jugular IV pulled out when pt OOB to BR.  Minimal bleeding noted.  Iv team attempted x 5 to insert IV but unsuccessful.

## 2015-04-22 NOTE — Progress Notes (Signed)
Pt upset with pain management.  Expressed frustration with oral medications not working.  Pt seen by Dr Vanessa Barbara and this RN.  Dr Vanessa Barbara discussed plan to d/c patient home today with follow-up at [facility] and [facility].  Dr Vanessa Barbara examined patient including pressing on patients abdomen.  Patient did not grimace during this time.  Appt made with [facility] for tomorrow 3/2 @ 1pm.  [facility] called and Zella Ball RN to call patient on her contact number [phone] by today @ 5pm to give appointment date at time.  The patients phone number was confirmed to ensure accuracy.

## 2015-04-22 NOTE — Progress Notes (Signed)
Reviewed prescriptions and discharge instructions with patient.  Patient has no questions regarding discharge instructions, refused to sign discharge papers.  Patient requested to speak with social worker, social worker notified and spoke with patient via room phone.  Patient requested to speak with case manager, case manager notified, case manager spoke with patient bedside.  This nurse questioned patient in regards to transportation home, pt states that her mother is on her was to pick her up.  Will continue to monitor.

## 2015-04-22 NOTE — Progress Notes (Signed)
On call urologist paged for consult per patient request.  On call Dr. Laverle Patter concurred with primary consult notes.  AC Tammy and security escorted patient and patients mother to exit.

## 2015-04-22 NOTE — Progress Notes (Addendum)
Called to speak with patient, she is upset at being d/ced. Per attending, patient is medically stable for d/c, instructed patient of this and that she will need to leave. Patient has f/u care, is seen a pain clinic for pain issues. Patient states she wants to speak with someone up the chain of command. Shared conversation with nurse and leadership. Patient calling mother as I was leaving room.

## 2015-04-22 NOTE — Progress Notes (Signed)
This NP has been paged several times tonight about pt's pain meds. Earlier in shift, pt was spoken to by the [facility].  Pt has po pain meds and is stating they are not working and is wanting IV narcotics. The attending saw pt on 2/28 and stopped the pt's dilaudid and the pt fired the attending. Pt has hx and documented records of drug seeking behavior. She goes to a pain clinic for chronic pain and missed her last appt so didn't have pain meds at home. She also lost IV access tonight because IJ came out when she got OOB. IV team tried multiple times to find PIV without success. Cipro was changed to po in lieu of her likely discharge home this am. At the time of this note, pt has no IV access to receive IV pain meds. In review of her vitals, her BP has not been elevated and her HR has been in the 60-70 range. It is time for her scheduled Oxycodone and pt can have 2 Norco prn at this time. MAR reviewed and pt has had the following tonight: Norco 5/325 x 6 (2 will be given now), Oxy  x 2, and Robaxin  x 2. Pt refuses to take offered Ibuprofen.  Please see attending's note from 04/21/15 as well.  [facility], NP [facility]

## 2015-04-22 NOTE — Progress Notes (Signed)
Pt crying in room and begging to see dr. on call.    amion paged Donnamarie Poag earlier - order received for Cipro change from IV to po.  Pt states that she feels she has been patient and has tried the po pain meds all day/night w/o any relief and needs to see someone w/in 30 minutes

## 2015-04-25 ENCOUNTER — Emergency Department (HOSPITAL_COMMUNITY): Payer: Medicaid Other | Admitting: Anesthesiology

## 2015-04-25 ENCOUNTER — Observation Stay (HOSPITAL_COMMUNITY)
Admission: EM | Admit: 2015-04-25 | Discharge: 2015-04-27 | Disposition: A | Discharge status: Home / Self Care | Payer: Medicaid Other | Attending: Internal Medicine | Admitting: Internal Medicine

## 2015-04-25 ENCOUNTER — Ambulatory Visit: Payer: Self-pay | Admitting: Urology

## 2015-04-25 ENCOUNTER — Encounter (HOSPITAL_COMMUNITY)
Admission: EM | Disposition: A | Discharge status: Skilled Nursing Facility | Payer: Self-pay | Source: Home / Self Care | Attending: Emergency Medicine

## 2015-04-25 ENCOUNTER — Encounter (HOSPITAL_COMMUNITY): Payer: Self-pay | Admitting: *Deleted

## 2015-04-25 ENCOUNTER — Emergency Department (HOSPITAL_COMMUNITY): Payer: Medicaid Other

## 2015-04-25 ENCOUNTER — Encounter (HOSPITAL_COMMUNITY): Payer: Self-pay | Admitting: Emergency Medicine

## 2015-04-25 ENCOUNTER — Emergency Department (HOSPITAL_COMMUNITY)
Admission: EM | Admit: 2015-04-25 | Discharge: 2015-04-25 | Disposition: A | Discharge status: Skilled Nursing Facility | Payer: Medicaid Other | Source: Home / Self Care | Attending: Emergency Medicine | Admitting: Emergency Medicine

## 2015-04-25 DIAGNOSIS — F419 Anxiety disorder, unspecified: Secondary | ICD-10-CM | POA: Diagnosis present

## 2015-04-25 DIAGNOSIS — N201 Calculus of ureter: Secondary | ICD-10-CM | POA: Diagnosis present

## 2015-04-25 DIAGNOSIS — I456 Pre-excitation syndrome: Secondary | ICD-10-CM | POA: Insufficient documentation

## 2015-04-25 DIAGNOSIS — R52 Pain, unspecified: Secondary | ICD-10-CM

## 2015-04-25 DIAGNOSIS — F329 Major depressive disorder, single episode, unspecified: Secondary | ICD-10-CM | POA: Insufficient documentation

## 2015-04-25 DIAGNOSIS — M797 Fibromyalgia: Secondary | ICD-10-CM | POA: Insufficient documentation

## 2015-04-25 DIAGNOSIS — R109 Unspecified abdominal pain: Secondary | ICD-10-CM | POA: Diagnosis present

## 2015-04-25 DIAGNOSIS — F32A Depression, unspecified: Secondary | ICD-10-CM | POA: Diagnosis present

## 2015-04-25 DIAGNOSIS — G8929 Other chronic pain: Secondary | ICD-10-CM | POA: Insufficient documentation

## 2015-04-25 DIAGNOSIS — N132 Hydronephrosis with renal and ureteral calculous obstruction: Principal | ICD-10-CM | POA: Insufficient documentation

## 2015-04-25 DIAGNOSIS — M419 Scoliosis, unspecified: Secondary | ICD-10-CM | POA: Diagnosis not present

## 2015-04-25 DIAGNOSIS — G43909 Migraine, unspecified, not intractable, without status migrainosus: Secondary | ICD-10-CM

## 2015-04-25 DIAGNOSIS — Z79899 Other long term (current) drug therapy: Secondary | ICD-10-CM | POA: Insufficient documentation

## 2015-04-25 DIAGNOSIS — M5030 Other cervical disc degeneration, unspecified cervical region: Secondary | ICD-10-CM | POA: Insufficient documentation

## 2015-04-25 DIAGNOSIS — F17210 Nicotine dependence, cigarettes, uncomplicated: Secondary | ICD-10-CM | POA: Diagnosis not present

## 2015-04-25 DIAGNOSIS — G894 Chronic pain syndrome: Secondary | ICD-10-CM | POA: Diagnosis present

## 2015-04-25 DIAGNOSIS — E785 Hyperlipidemia, unspecified: Secondary | ICD-10-CM | POA: Insufficient documentation

## 2015-04-25 DIAGNOSIS — M5136 Other intervertebral disc degeneration, lumbar region: Secondary | ICD-10-CM

## 2015-04-25 DIAGNOSIS — N23 Unspecified renal colic: Secondary | ICD-10-CM

## 2015-04-25 DIAGNOSIS — Z720 Tobacco use: Secondary | ICD-10-CM | POA: Diagnosis present

## 2015-04-25 DIAGNOSIS — N1330 Unspecified hydronephrosis: Secondary | ICD-10-CM

## 2015-04-25 HISTORY — PX: HOLMIUM LASER APPLICATION: SHX5852

## 2015-04-25 HISTORY — PX: CYSTOSCOPY W/ URETERAL STENT PLACEMENT: SHX1429

## 2015-04-25 LAB — URINE MICROSCOPIC-ADD ON

## 2015-04-25 LAB — I-STAT CHEM 8, ED
BUN: 11 mg/dL (ref 6–20)
CREATININE: 1 mg/dL (ref 0.44–1.00)
Calcium, Ion: 1.11 mmol/L — ABNORMAL LOW (ref 1.12–1.23)
Chloride: 104 mmol/L (ref 101–111)
Glucose, Bld: 129 mg/dL — ABNORMAL HIGH (ref 65–99)
HEMATOCRIT: 51 % — AB (ref 36.0–46.0)
HEMOGLOBIN: 17.3 g/dL — AB (ref 12.0–15.0)
POTASSIUM: 3.5 mmol/L (ref 3.5–5.1)
SODIUM: 142 mmol/L (ref 135–145)
TCO2: 22 mmol/L (ref 0–100)

## 2015-04-25 LAB — URINALYSIS, ROUTINE W REFLEX MICROSCOPIC
BILIRUBIN URINE: NEGATIVE
Glucose, UA: NEGATIVE mg/dL
KETONES UR: NEGATIVE mg/dL
NITRITE: NEGATIVE
PH: 6 (ref 5.0–8.0)
Protein, ur: NEGATIVE mg/dL
SPECIFIC GRAVITY, URINE: 1.015 (ref 1.005–1.030)

## 2015-04-25 SURGERY — CYSTOSCOPY, WITH RETROGRADE PYELOGRAM AND URETERAL STENT INSERTION
Anesthesia: General | Site: Ureter | Laterality: Right

## 2015-04-25 MED ORDER — IOHEXOL 300 MG/ML  SOLN
INTRAMUSCULAR | Status: DC | PRN
  Administered 2015-04-25: 10 mL via URETHRAL

## 2015-04-25 MED ORDER — 0.9 % SODIUM CHLORIDE (POUR BTL) OPTIME
TOPICAL | Status: DC | PRN
  Administered 2015-04-25: 1000 mL

## 2015-04-25 MED ORDER — CIPROFLOXACIN IN D5W 400 MG/200ML IV SOLN
INTRAVENOUS | Status: DC | PRN
  Administered 2015-04-25: 400 mg via INTRAVENOUS

## 2015-04-25 MED ORDER — HYDROMORPHONE HCL 1 MG/ML IJ SOLN
2.0000 mg | Freq: Once | INTRAMUSCULAR | Status: AC
  Administered 2015-04-25: 2 mg via INTRAMUSCULAR
  Filled 2015-04-25: qty 2

## 2015-04-25 MED ORDER — DEXAMETHASONE SODIUM PHOSPHATE 10 MG/ML IJ SOLN
INTRAMUSCULAR | Status: AC
  Filled 2015-04-25: qty 1

## 2015-04-25 MED ORDER — ONDANSETRON HCL 4 MG/2ML IJ SOLN
INTRAMUSCULAR | Status: AC
  Filled 2015-04-25: qty 2

## 2015-04-25 MED ORDER — FENTANYL CITRATE (PF) 100 MCG/2ML IJ SOLN: 100.0000 ug | Freq: Once | INTRAMUSCULAR | Status: DC

## 2015-04-25 MED ORDER — HYDROMORPHONE HCL 1 MG/ML IJ SOLN
1.0000 mg | Freq: Once | INTRAMUSCULAR | Status: AC
  Administered 2015-04-25: 1 mg via INTRAVENOUS
  Filled 2015-04-25: qty 1

## 2015-04-25 MED ORDER — FENTANYL CITRATE (PF) 100 MCG/2ML IJ SOLN
INTRAMUSCULAR | Status: AC
  Filled 2015-04-25: qty 2

## 2015-04-25 MED ORDER — SODIUM CHLORIDE 0.9 % IR SOLN
Status: DC | PRN
  Administered 2015-04-25: 3000 mL
  Administered 2015-04-25 (×2): 1000 mL

## 2015-04-25 MED ORDER — MEPERIDINE HCL 25 MG/ML IJ SOLN: 6.2500 mg | INTRAMUSCULAR | Status: DC | PRN

## 2015-04-25 MED ORDER — PHENAZOPYRIDINE HCL 200 MG PO TABS: 200.0000 mg | ORAL_TABLET | Freq: Three times a day (TID) | ORAL | Status: DC | PRN

## 2015-04-25 MED ORDER — CIPROFLOXACIN IN D5W 400 MG/200ML IV SOLN
INTRAVENOUS | Status: AC
  Filled 2015-04-25: qty 200

## 2015-04-25 MED ORDER — SUCCINYLCHOLINE CHLORIDE 20 MG/ML IJ SOLN
INTRAMUSCULAR | Status: DC | PRN
  Administered 2015-04-25: 100 mg via INTRAVENOUS

## 2015-04-25 MED ORDER — LACTATED RINGERS IV SOLN
INTRAVENOUS | Status: DC | PRN
  Administered 2015-04-25: 11:00:00 via INTRAVENOUS

## 2015-04-25 MED ORDER — SODIUM CHLORIDE 0.9 % IV SOLN: INTRAVENOUS | Status: DC

## 2015-04-25 MED ORDER — TAMSULOSIN HCL 0.4 MG PO CAPS
0.4000 mg | ORAL_CAPSULE | ORAL | Status: DC
Start: 2015-04-25 — End: 2015-07-17

## 2015-04-25 MED ORDER — HYDROMORPHONE HCL 1 MG/ML IJ SOLN
1.0000 mg | Freq: Once | INTRAMUSCULAR | Status: AC
  Administered 2015-04-26: 1 mg via INTRAVENOUS
  Filled 2015-04-25: qty 1

## 2015-04-25 MED ORDER — DEXAMETHASONE SODIUM PHOSPHATE 10 MG/ML IJ SOLN
INTRAMUSCULAR | Status: DC | PRN
  Administered 2015-04-25: 10 mg via INTRAVENOUS

## 2015-04-25 MED ORDER — HYDROMORPHONE HCL 1 MG/ML IJ SOLN
2.0000 mg | Freq: Once | INTRAMUSCULAR | Status: AC
Start: 2015-04-25 — End: 2015-04-25
  Administered 2015-04-25: 2 mg via INTRAMUSCULAR
  Filled 2015-04-25: qty 2

## 2015-04-25 MED ORDER — HYDROMORPHONE HCL 1 MG/ML IJ SOLN
1.0000 mg | Freq: Once | INTRAMUSCULAR | Status: DC
Start: 2015-04-25 — End: 2015-04-25

## 2015-04-25 MED ORDER — PROMETHAZINE HCL 25 MG/ML IJ SOLN
6.2500 mg | INTRAMUSCULAR | Status: DC | PRN
  Filled 2015-04-25: qty 1

## 2015-04-25 MED ORDER — ONDANSETRON 4 MG PO TBDP
8.0000 mg | ORAL_TABLET | Freq: Once | ORAL | Status: AC
  Administered 2015-04-25: 8 mg via ORAL
  Filled 2015-04-25: qty 2

## 2015-04-25 MED ORDER — PROPOFOL 10 MG/ML IV BOLUS
INTRAVENOUS | Status: AC
  Filled 2015-04-25: qty 20

## 2015-04-25 MED ORDER — HYDROMORPHONE HCL 1 MG/ML IJ SOLN
1.0000 mg | Freq: Once | INTRAMUSCULAR | Status: DC
  Filled 2015-04-25: qty 1

## 2015-04-25 MED ORDER — FENTANYL CITRATE (PF) 100 MCG/2ML IJ SOLN
INTRAMUSCULAR | Status: DC | PRN
  Administered 2015-04-25: 50 ug via INTRAVENOUS

## 2015-04-25 MED ORDER — LIDOCAINE HCL (CARDIAC) 20 MG/ML IV SOLN
INTRAVENOUS | Status: AC
  Filled 2015-04-25: qty 5

## 2015-04-25 MED ORDER — LIDOCAINE HCL (CARDIAC) 20 MG/ML IV SOLN
INTRAVENOUS | Status: DC | PRN
  Administered 2015-04-25: 100 mg via INTRAVENOUS

## 2015-04-25 MED ORDER — PROPOFOL 10 MG/ML IV BOLUS
INTRAVENOUS | Status: DC | PRN
  Administered 2015-04-25: 175 mg via INTRAVENOUS

## 2015-04-25 MED ORDER — HYDROCODONE-ACETAMINOPHEN 10-325 MG PO TABS: 1.0000 | ORAL_TABLET | ORAL | Status: DC | PRN

## 2015-04-25 MED ORDER — ONDANSETRON HCL 4 MG/2ML IJ SOLN
INTRAMUSCULAR | Status: DC | PRN
Start: 2015-04-25 — End: 2015-04-25
  Administered 2015-04-25 (×2): 2 mg via INTRAVENOUS

## 2015-04-25 MED ORDER — MIDAZOLAM HCL 5 MG/5ML IJ SOLN
INTRAMUSCULAR | Status: DC | PRN
  Administered 2015-04-25: 0.5 mg via INTRAVENOUS

## 2015-04-25 MED ORDER — FENTANYL CITRATE (PF) 100 MCG/2ML IJ SOLN
100.0000 ug | Freq: Once | INTRAMUSCULAR | Status: AC
  Administered 2015-04-25: 100 ug via NASAL
  Filled 2015-04-25: qty 2

## 2015-04-25 MED ORDER — SODIUM CHLORIDE 0.9 % IV BOLUS (SEPSIS)
500.0000 mL | Freq: Once | INTRAVENOUS | Status: AC
  Administered 2015-04-25: 500 mL via INTRAVENOUS

## 2015-04-25 MED ORDER — FENTANYL CITRATE (PF) 100 MCG/2ML IJ SOLN
25.0000 ug | INTRAMUSCULAR | Status: DC | PRN
  Administered 2015-04-25: 50 ug via INTRAVENOUS

## 2015-04-25 MED ORDER — MIDAZOLAM HCL 2 MG/2ML IJ SOLN
INTRAMUSCULAR | Status: AC
  Filled 2015-04-25: qty 2

## 2015-04-25 MED ORDER — TAMSULOSIN HCL 0.4 MG PO CAPS
0.4000 mg | ORAL_CAPSULE | Freq: Once | ORAL | Status: AC
  Administered 2015-04-25: 0.4 mg via ORAL
  Filled 2015-04-25: qty 1

## 2015-04-25 SURGICAL SUPPLY — 19 items
BAG URO CATCHER STRL LF (MISCELLANEOUS) ×2 IMPLANT
BASKET DAKOTA 1.9FR 11X120 (BASKET) ×2 IMPLANT
BASKET LASER NITINOL 1.9FR (BASKET) ×2 IMPLANT
CATH INTERMIT  6FR 70CM (CATHETERS) ×2 IMPLANT
CLOTH BEACON ORANGE TIMEOUT ST (SAFETY) ×2 IMPLANT
FIBER LASER FLEXIVA 1000 (UROLOGICAL SUPPLIES) IMPLANT
FIBER LASER FLEXIVA 200 (UROLOGICAL SUPPLIES) ×2 IMPLANT
FIBER LASER FLEXIVA 365 (UROLOGICAL SUPPLIES) IMPLANT
FIBER LASER FLEXIVA 550 (UROLOGICAL SUPPLIES) IMPLANT
FIBER LASER TRAC TIP (UROLOGICAL SUPPLIES) ×2 IMPLANT
GLOVE BIOGEL M 8.0 STRL (GLOVE) ×2 IMPLANT
GOWN STRL REUS W/TWL XL LVL3 (GOWN DISPOSABLE) ×2 IMPLANT
GUIDEWIRE STR DUAL SENSOR (WIRE) ×2 IMPLANT
IV NS 1000ML (IV SOLUTION) ×1
IV NS 1000ML BAXH (IV SOLUTION) ×1 IMPLANT
MANIFOLD NEPTUNE II (INSTRUMENTS) ×2 IMPLANT
PACK CYSTO (CUSTOM PROCEDURE TRAY) ×2 IMPLANT
STENT POLARIS 5FRX24 (STENTS) ×2 IMPLANT
TUBING CONNECTING 10 (TUBING) ×2 IMPLANT

## 2015-04-25 NOTE — ED Provider Notes (Signed)
Pt sent from [facility] ED to see Dr Vernie Ammonsttelin for persistent renal colic not controlled with opiate pain medications.   Recently diagnosed with kidney stones.  US performed this am shows persistent hydroneprhosis and bilateral renal stones.  Pt appears stable.  Requests a dose of pain medication.  Dr Vernie Ammonsttelin was notified that RN Barrett that patient is in the ED.   Linwood DibblesJon Nataya Bastedo, MD 04/25/15 1031

## 2015-04-25 NOTE — ED Notes (Signed)
Pt transported by EMS from home c/o back pain, [facility] in place.

## 2015-04-25 NOTE — ED Notes (Signed)
[facility] with report of bil kidney stones. Pt was given Diluadid and Zofran at other facility. Pt reported nausea and pain 9/10. ABC's intact. IV saline lock patent and intact to rt inner wrist. Family at bedside.

## 2015-04-25 NOTE — Anesthesia Postprocedure Evaluation (Signed)
Anesthesia Post Note  Patient: Brooke [name]  Procedure(s) Performed: Procedure(s) (LRB): CYSTOSCOPY WITH RETROGRADE PYELOGRAM/URETERAL STENT PLACEMENT (Right) HOLMIUM LASER APPLICATION (Right)  Patient location during evaluation: PACU Anesthesia Type: General Level of consciousness: awake and alert Pain management: pain level controlled Vital Signs Assessment: post-procedure vital signs reviewed and stable Respiratory status: spontaneous breathing, nonlabored ventilation, respiratory function stable and patient connected to nasal cannula oxygen Cardiovascular status: blood pressure returned to baseline and stable Postop Assessment: no signs of nausea or vomiting Anesthetic complications: no    Last Vitals:  Filed Vitals:   04/25/15 1228 04/25/15 1230  BP: 127/73 116/77  Pulse: 72 65  Temp:    Resp: 13 15    Last Pain:  Filed Vitals:   04/25/15 1231  PainSc: 10-Worst pain ever                 Vue Pavon

## 2015-04-25 NOTE — Anesthesia Procedure Notes (Signed)
Procedure Name: Intubation Date/Time: 04/25/2015 11:32 AM Performed by: Edison PaceGRAY, Shareece Bultman E Pre-anesthesia Checklist: Emergency Drugs available, Patient identified, Suction available, Patient being monitored and Timeout performed Patient Re-evaluated:Patient Re-evaluated prior to inductionOxygen Delivery Method: Circle system utilized Preoxygenation: Pre-oxygenation with 100% oxygen Intubation Type: IV induction and Cricoid Pressure applied Laryngoscope Size: Mac and 3 Grade View: Grade II Tube type: Oral Tube size: 7.5 mm Number of attempts: 1 Airway Equipment and Method: Stylet Placement Confirmation: positive ETCO2 and ETT inserted through vocal cords under direct vision Secured at: 21 cm Tube secured with: Tape Dental Injury: Teeth and Oropharynx as per pre-operative assessment  Difficulty Due To: Difficult Airway- due to limited oral opening

## 2015-04-25 NOTE — ED Notes (Signed)
The pt is c/o bi-lateral flank pain for one week.  She was [facility].  She is seen at a pain management clinic,  She had 10 mg of oxycodone last night.   lmp none

## 2015-04-25 NOTE — Transfer of Care (Signed)
Immediate Anesthesia Transfer of Care Note  Patient: Brooke [name]  Procedure(s) Performed: Procedure(s): CYSTOSCOPY WITH RETROGRADE PYELOGRAM/URETERAL STENT PLACEMENT (Right) HOLMIUM LASER APPLICATION (Right)  Patient Location: PACU  Anesthesia Type:General  Level of Consciousness: awake, oriented, patient cooperative, lethargic and responds to stimulation  Airway & Oxygen Therapy: Patient Spontanous Breathing and Patient connected to face mask oxygen  Post-op Assessment: Report given to RN, Post -op Vital signs reviewed and stable and Patient moving all extremities  Post vital signs: Reviewed and stable  Last Vitals:  Filed Vitals:   04/25/15 1245 04/25/15 1300  BP: 126/62   Pulse: 64   Temp: 36.5 C 36.6 C  Resp: 12     Complications: No apparent anesthesia complications

## 2015-04-25 NOTE — ED Notes (Signed)
Bed: [id number] Expected date:  Expected time:  Means of arrival:  Comments: Back pain recent surgery

## 2015-04-25 NOTE — Anesthesia Preprocedure Evaluation (Addendum)
Anesthesia Evaluation  Patient identified by MRN, date of birth, ID band Patient awake    Reviewed: Allergy & Precautions, NPO status , Patient's Chart, lab work & pertinent test results  Airway Mallampati: II       Dental  (+) Edentulous Upper, Edentulous Lower   Pulmonary Current Smoker,    breath sounds clear to auscultation       Cardiovascular  Rhythm:Regular  WPW syndrome     Neuro/Psych  Headaches, Anxiety Depression    GI/Hepatic negative GI ROS, Neg liver ROS,   Endo/Other  negative endocrine ROS  Renal/GU R ureteral stone with hydro     Musculoskeletal  (+) Fibromyalgia -  Abdominal   Peds  Hematology 17/51   Anesthesia Other Findings   Reproductive/Obstetrics                            Anesthesia Physical Anesthesia Plan  ASA: II  Anesthesia Plan: General   Post-op Pain Management:    Induction: Intravenous  Airway Management Planned: Oral ETT  Additional Equipment:   Intra-op Plan:   Post-operative Plan: Extubation in OR  Informed Consent: I have reviewed the patients History and Physical, chart, labs and discussed the procedure including the risks, benefits and alternatives for the proposed anesthesia with the patient or authorized representative who has indicated his/her understanding and acceptance.     Plan Discussed with:   Anesthesia Plan Comments:         Anesthesia Quick Evaluation

## 2015-04-25 NOTE — ED Provider Notes (Signed)
CSN: [id number]     Arrival date & time 04/25/15  0219 History   First MD Initiated Contact with Patient 04/25/15 [phone]     Chief Complaint  Patient presents with  . Flank Pain     Patient is a 40 y.o. female presenting with flank pain. The history is provided by the patient.  Flank Pain This is a recurrent problem. The current episode started more than 2 days ago. The problem occurs daily. The problem has been gradually worsening. Associated symptoms include abdominal pain. Exacerbated by: movement. Nothing relieves the symptoms.   Pt reports bilateral flank pain for about one week She reports diagnosis of kidney stone, was admitted to hospital and discharged but she reports pain never resolved.  She reports nausea but no vomiting No fever She reports dysuria She reports this feels similar to prior episodes of kidney stone Past Medical History  Diagnosis Date  . Fibromyalgia   . DDD (degenerative disc disease), cervical   . Migraine   . WPW (Wolff-Parkinson-White syndrome)   . Depression   . Anxiety   . Chiari malformation   . Scoliosis   . Degenerative disc disease, lumbar   . Hyperlipemia   . Allergic rhinitis    Past Surgical History  Procedure Laterality Date  . Knee surgery Bilateral     Right Knee x 1 - Left x3  . Cesarean section    . Tubal ligation    . Eye surgery Bilateral   . Ears Bilateral   . Hernia repair    . Vein removed Left     arm  . Herneated bowel repair     Family History  Problem Relation Age of Onset  . Migraines Mother   . CAD Father   . Migraines Daughter    Social History  Substance Use Topics  . Smoking status: Current Every Day Smoker -- 0.25 packs/day    Types: Cigarettes  . Smokeless tobacco: Never Used  . Alcohol Use: No   OB History    No data available     Review of Systems  Constitutional: Negative for fever.  Respiratory: Positive for cough.   Gastrointestinal: Positive for abdominal pain.  Genitourinary: Positive for  dysuria and flank pain.  All other systems reviewed and are negative.     Allergies  Digoxin and related; Toradol; Latex; Other; and Tape  Home Medications   Prior to Admission medications   Medication Sig Start Date End Date Taking? Authorizing Provider  busPIRone (BUSPAR) 15 MG tablet Take 15 mg by mouth 2 (two) times daily.   Yes Historical Provider, MD  Butalbital-APAP-Caffeine (FIORICET) 50-300-40 MG CAPS Take 1-2 tablets by mouth every 8 (eight) hours as needed (for migraine).   Yes Historical Provider, MD  ciprofloxacin (CIPRO) 500 MG tablet Take 1 tablet (500 mg total) by mouth 2 (two) times daily. 04/22/15  Yes Jeralyn Bennett, MD  clonazePAM (KLONOPIN) 1 MG tablet Take 0.5-1 mg by mouth 2 (two) times daily. TAKES 0.5MG  IN AM AND  IN PM   Yes Historical Provider, MD  hydrOXYzine (VISTARIL) 25 MG capsule Take 25 mg by mouth daily as needed for anxiety.  04/09/15  Yes Historical Provider, MD  levonorgestrel (MIRENA) 20 MCG/24HR IUD 1 each by Intrauterine route once.   Yes Historical Provider, MD  methocarbamol (ROBAXIN) 500 MG tablet Take 1 tablet (500 mg total) by mouth every 6 (six) hours as needed for muscle spasms. 04/22/15  Yes Jeralyn Bennett, MD  tamsulosin (FLOMAX) 0.4  MG CAPS capsule Take 1 capsule (0.4 mg total) by mouth daily. 04/22/15  Yes Jeralyn Bennett, MD  Oxycodone HCl 10 MG TABS Take 1 tablet (10 mg total) by mouth 4 (four) times daily. Patient not taking: Reported on 04/25/2015 04/22/15   Jeralyn Bennett, MD   BP 101/69 mmHg  Pulse 84  Temp(Src) 97.8 F (36.6 C) (Oral)  Resp 16  SpO2 100% Physical Exam CONSTITUTIONAL: Well developed/well nourished, uncomfortable appearing HEAD: Normocephalic/atraumatic EYES: EOMI ENMT: Mucous membranes moist NECK: supple no meningeal signs SPINE/BACK:entire spine nontender CV: S1/S2 noted, no murmurs/rubs/gallops noted LUNGS: Lungs are clear to auscultation bilaterally, no apparent distress ABDOMEN: soft, nontender, no rebound  or guarding, bowel sounds noted throughout abdomen WU:[id number] cva tenderness, no bruising or erythema noted to flank NEURO: Pt is awake/alert/appropriate, moves all extremitiesx4.  EXTREMITIES: pulses normal/equal, full ROM SKIN: warm, color normal PSYCH: anxious  ED Course  Procedures  Medications  HYDROmorphone (DILAUDID) injection 1 mg (not administered)  sodium chloride 0.9 % bolus 500 mL (not administered)  HYDROmorphone (DILAUDID) injection 2 mg (2 mg Intramuscular Given 04/25/15 0444)  ondansetron (ZOFRAN-ODT) disintegrating tablet 8 mg (8 mg Oral Given 04/25/15 0443)  HYDROmorphone (DILAUDID) injection 2 mg (2 mg Intramuscular Given 04/25/15 0753)    4:49 AM Pt reports she has had flank pain for a week, seen in ED and admitted for intractable pain but was treated non-operatively She reports her pain is worsening Will recheck u/a and also electrolytes Will also perform US renal to evaluate for any hydronephrosis 7:25 AM Pt with persistent right flank pain Her urine is not infected Recent negative urine pregnancy on recent admission  She has known 8mm stone on right ureter US renal tonight reveals persistent right hydro No signs of UTI She did not respond to IM dilaudid I spoke to urology and this patient with persistent flank pain with hydronephrosis They request to give more pain meds including toradol Pt has toradol allergy Will give another round of dilaudid Pt otherwise stable and this is all likely result of ureteral colic 8:24 AM Pt given another round of IM dilaudid but still with significant pain D/w dr Vernie Ammons, he requests transfer to [facility] ED and he will likely place ureteral stent I spoke to dr jon knapp to make him aware of patient coming to the ED She has been NPO sinec 7pm last night Labs Review Labs Reviewed  URINALYSIS, ROUTINE W REFLEX MICROSCOPIC (NOT AT [facility]) - Abnormal; Notable for the following:    APPearance CLOUDY (*)    Hgb urine dipstick SMALL (*)     Leukocytes, UA LARGE (*)    All other components within normal limits  URINE MICROSCOPIC-ADD ON - Abnormal; Notable for the following:    Squamous Epithelial / LPF 6-30 (*)    Bacteria, UA RARE (*)    All other components within normal limits  I-STAT CHEM 8, ED - Abnormal; Notable for the following:    Glucose, Bld 129 (*)    Calcium, Ion 1.11 (*)    Hemoglobin 17.3 (*)    HCT 51.0 (*)    All other components within normal limits    Imaging Review US Renal  04/25/2015  CLINICAL DATA:  Subacute onset of bilateral flank pain. Initial encounter. EXAM: RENAL / URINARY TRACT ULTRASOUND COMPLETE COMPARISON:  CT of the abdomen and pelvis performed 04/20/2015 FINDINGS: Right Kidney: Length: 12.0 cm. Echogenicity within normal limits. There is mild right-sided hydronephrosis. A nonobstructing 4 mm stone is noted at the  upper pole of the right kidney. No mass is seen. Left Kidney: Length: 11.6 cm. Echogenicity within normal limits. A 3 mm stone is noted at the upper pole of the left kidney. No mass or hydronephrosis visualized. Bladder: Relatively decompressed and not well assessed. IMPRESSION: 1. Persistent mild right-sided hydronephrosis, concerning for persistent or recurrent right ureteral obstruction. 2. Nonobstructing bilateral renal stones noted. Electronically Signed   By: Roanna RaiderJeffery  Chang M.D.   On: 04/25/2015 05:34   I have personally reviewed and evaluated these images and lab results as part of my medical decision-making.   MDM   Final diagnoses:  Ureteral colic  Hydronephrosis, unspecified hydronephrosis type    Nursing notes including past medical history and social history reviewed and considered in documentation Labs/vital reviewed myself and considered during evaluation Previous records reviewed and considered     Zadie Rhineonald Jhene Westmoreland, MD 04/25/15 [phone]

## 2015-04-25 NOTE — Op Note (Signed)
PATIENT:  Brooke [name]  PRE-OPERATIVE DIAGNOSIS:  right Ureteral calculus  POST-OPERATIVE DIAGNOSIS: Same  PROCEDURE:  1. Cystoscopy with right retrograde pyelogram including interpretation 2. Right ureteroscopy, laser lithotripsy and stone extraction with stent placement  SURGEON: Garnett FarmMark C Hallelujah Wysong, MD  INDICATION: Brooke LimHolly is a 40 year old female with a history of calculus disease. She had an 8 mm stone located at the right UVJ and has been having intermittent pain requiring admission to the hospital previously. She returned to the emergency room with continued pain that was unremitting and not controlled with narcotic pain medication. We therefore discussed treatment options and she has elected to proceed with surgical management at this time.  ANESTHESIA:  General  EBL:  Minimal  DRAINS: 5 French, 24 cm Polaris stent (with string)  SPECIMEN:  Stone given to patient  DESCRIPTION OF PROCEDURE: The patient was taken to the major OR and placed on the table. General anesthesia was administered and then the patient was moved to the dorsal lithotomy position. The genitalia was sterilely prepped and draped. An official timeout was performed.  Initially the 23 French cystoscope with 30 lens was passed under direct vision into the bladder. The bladder was then fully inspected. It was noted be free of any tumors, stones or inflammatory lesions. Ureteral orifices were of normal configuration and position. A 6 French open-ended ureteral catheter was then passed through the cystoscope into the ureteral orifice in order to perform a right retrograde pyelogram.  A retrograde pyelogram was performed by injecting full-strength contrast up the right ureter under direct fluoroscopic control. It revealed a filling defect in the proximal ureter at the level of the ureteropelvic junction consistent with the stone seen on the preoperative CT. The remainder of the ureter was noted to be normal as was the  intrarenal collecting system. I then passed a 0.038 inch floppy-tipped guidewire through the open ended catheter and into the area of the renal pelvis and this was left in place. The inner portion of a ureteral access sheath was then passed over the guidewire to gently dilate the intramural ureter and then passed the ureteral access sheath up the ureter and removed the inner portion as well as guidewire. I then proceeded with ureteroscopy.  A 6 French flexible, digital ureteroscope was then passed the ureteroscope through the access sheath and to the level of the ureteropelvic junction where the stone had been located. I noted edema and inflammation but the stone had fallen back into the kidney. I therefore advanced the scope into the renal pelvis and was able to identify the stone. The stone was identified and I felt it was too large to extract and therefore elected to proceed with laser lithotripsy. The 200  holmium laser fiber was used to fragment the stone. I then used the nitinol basket to extract all of the stone fragments and reinspection of the collecting system ureteroscopically revealed no further stone fragments and no injury. I then reinserted the guidewire through the access sheath and removed the access sheath leaving the guidewire in place. I then backloaded the cystoscope over the guidewire and passed the stent over the guidewire into the area of the renal pelvis. As the guidewire was removed good curl was noted in the renal pelvis. The bladder was drained and the cystoscope was then removed. The patient tolerated the procedure well no intraoperative complications.  PLAN OF CARE: Discharge to home after PACU  PATIENT DISPOSITION:  PACU - hemodynamically stable.

## 2015-04-25 NOTE — ED Notes (Signed)
Patient transported to Ultrasound 

## 2015-04-25 NOTE — ED Notes (Signed)
Bed: [id number] Expected date:  Expected time:  Means of arrival:  Comments[facility]

## 2015-04-25 NOTE — ED Notes (Signed)
Dr. Vernie Ammonsttelin aware of pt's arrival.

## 2015-04-25 NOTE — Discharge Instructions (Signed)

## 2015-04-25 NOTE — ED Provider Notes (Signed)
CSN: [id number]     Arrival date & time 04/25/15  2105 History   First MD Initiated Contact with Patient 04/25/15 2110     Chief Complaint  Patient presents with  . Back Pain     HPI   Brooke [name] is an 40 y.o. female who presents to the ED for evaluation of severe right flank pain. She was seen by Dr. Vernie Ammons (urology) earlier today for right ureteroscopy with stone lithotripsy/extraction and stent placement. Pt states her pain was initially well controlled after the procedure. States that since transitioning to her home PO oxycodone her pain is severe and debilitating. In the ED she is tearful and moaning/screaming in bed. Her stent was found in the bed in her sheets. She first told me she did not know how the stent came out and then she told me that it got tangled in her underwear and she accidentally pulled it out. Denies abdominal pain. Denies n/v. States she urinated once earlier today with no dysuria, gross hematuria.   Past Medical History  Diagnosis Date  . Fibromyalgia   . DDD (degenerative disc disease), cervical   . Migraine   . WPW (Wolff-Parkinson-White syndrome)   . Depression   . Anxiety   . Chiari malformation   . Scoliosis   . Degenerative disc disease, lumbar   . Hyperlipemia   . Allergic rhinitis    Past Surgical History  Procedure Laterality Date  . Knee surgery Bilateral     Right Knee x 1 - Left x3  . Cesarean section    . Tubal ligation    . Eye surgery Bilateral   . Ears Bilateral   . Hernia repair    . Vein removed Left     arm  . Herneated bowel repair     Family History  Problem Relation Age of Onset  . Migraines Mother   . CAD Father   . Migraines Daughter    Social History  Substance Use Topics  . Smoking status: Current Every Day Smoker -- 0.25 packs/day    Types: Cigarettes  . Smokeless tobacco: Never Used  . Alcohol Use: No   OB History    No data available     Review of Systems  All other systems reviewed and are  negative.     Allergies  Digoxin and related; Toradol; Latex; Other; and Tape  Home Medications   Prior to Admission medications   Medication Sig Start Date End Date Taking? Authorizing Provider  busPIRone (BUSPAR) 15 MG tablet Take 15 mg by mouth 2 (two) times daily.    Historical Provider, MD  Butalbital-APAP-Caffeine (FIORICET) 50-300-40 MG CAPS Take 1-2 tablets by mouth every 8 (eight) hours as needed (for migraine).    Historical Provider, MD  ciprofloxacin (CIPRO) 500 MG tablet Take 1 tablet (500 mg total) by mouth 2 (two) times daily. 04/22/15   Jeralyn Bennett, MD  clonazePAM (KLONOPIN) 1 MG tablet Take 0.5-1 mg by mouth 2 (two) times daily. TAKES 0.5MG  IN AM AND  IN PM    Historical Provider, MD  HYDROcodone-acetaminophen (NORCO) 10-325 MG tablet Take 1-2 tablets by mouth every 4 (four) hours as needed for moderate pain. Maximum dose per 24 hours - 8 pills 04/25/15   Ihor Gully, MD  hydrOXYzine (VISTARIL) 25 MG capsule Take 25 mg by mouth daily as needed for anxiety.  04/09/15   Historical Provider, MD  levonorgestrel (MIRENA) 20 MCG/24HR IUD 1 each by Intrauterine route once.  Historical Provider, MD  methocarbamol (ROBAXIN) 500 MG tablet Take 1 tablet (500 mg total) by mouth every 6 (six) hours as needed for muscle spasms. 04/22/15   Jeralyn Bennett, MD  Oxycodone HCl 10 MG TABS Take 1 tablet (10 mg total) by mouth 4 (four) times daily. Patient not taking: Reported on 04/25/2015 04/22/15   Jeralyn Bennett, MD  phenazopyridine (PYRIDIUM) 200 MG tablet Take 1 tablet (200 mg total) by mouth 3 (three) times daily as needed for pain. 04/25/15   Ihor Gully, MD  tamsulosin (FLOMAX) 0.4 MG CAPS capsule Take 1 capsule (0.4 mg total) by mouth daily. 04/22/15   Jeralyn Bennett, MD  tamsulosin (FLOMAX) 0.4 MG CAPS capsule Take 1 capsule (0.4 mg total) by mouth daily after supper. 04/25/15   Ihor Gully, MD   BP 112/84 mmHg  Pulse 78  Temp(Src) 99.8 F (37.7 C) (Oral)  Resp 22  SpO2  99% Physical Exam  Constitutional: She is oriented to person, place, and time.  Tearful, moaning, writhing in bed  HENT:  Right Ear: External ear normal.  Left Ear: External ear normal.  Nose: Nose normal.  Mouth/Throat: Oropharynx is clear and moist.  Eyes: Conjunctivae are normal. Pupils are equal, round, and reactive to light.  Neck: Neck supple.  Cardiovascular: Normal rate, regular rhythm, normal heart sounds and intact distal pulses.   Pulmonary/Chest: Effort normal and breath sounds normal. No respiratory distress.  Abdominal: Soft. Bowel sounds are normal. She exhibits no distension.  Marked right CVA tenderness  Musculoskeletal: She exhibits no edema.  Lymphadenopathy:    She has no cervical adenopathy.  Neurological: She is alert and oriented to person, place, and time. No cranial nerve deficit.  Skin: Skin is warm and dry.  Psychiatric: She has a normal mood and affect.  Nursing note and vitals reviewed.  Filed Vitals:   04/25/15 2315 04/25/15 2330 04/26/15 0000 04/26/15 0014  BP: 111/62 105/61 106/67   Pulse: 78 80 81   Temp:    98.4 F (36.9 C)  TempSrc:    Oral  Resp:      SpO2: 96% 95% 96%      ED Course  Procedures (including critical care time) Labs Review  Results for orders placed or performed during the hospital encounter of 04/25/15  Urinalysis, Routine w reflex microscopic (not at [facility])  Result Value Ref Range   Color, Urine YELLOW YELLOW   APPearance CLOUDY (A) CLEAR   Specific Gravity, Urine 1.015 1.005 - 1.030   pH 6.0 5.0 - 8.0   Glucose, UA NEGATIVE NEGATIVE mg/dL   Hgb urine dipstick SMALL (A) NEGATIVE   Bilirubin Urine NEGATIVE NEGATIVE   Ketones, ur NEGATIVE NEGATIVE mg/dL   Protein, ur NEGATIVE NEGATIVE mg/dL   Nitrite NEGATIVE NEGATIVE   Leukocytes, UA LARGE (A) NEGATIVE  Urine microscopic-add on  Result Value Ref Range   Squamous Epithelial / LPF 6-30 (A) NONE SEEN   WBC, UA 6-30 0 - 5 WBC/hpf   RBC / HPF 0-5 0 - 5 RBC/hpf    Bacteria, UA RARE (A) NONE SEEN  I-stat chem 8, ed  Result Value Ref Range   Sodium 142 135 - 145 mmol/L   Potassium 3.5 3.5 - 5.1 mmol/L   Chloride 104 101 - 111 mmol/L   BUN 11 6 - 20 mg/dL   Creatinine, Ser 8.29 0.44 - 1.00 mg/dL   Glucose, Bld 562 (H) 65 - 99 mg/dL   Calcium, Ion 1.30 (L) 1.12 - 1.23 mmol/L  TCO2 22 0 - 100 mmol/L   Hemoglobin 17.3 (H) 12.0 - 15.0 g/dL   HCT 11.951.0 (H) 14.736.0 - 82.946.0 %     Imaging Review None  I have personally reviewed and evaluated these images and lab results as part of my medical decision-making.   EKG Interpretation None      MDM   Final diagnoses:  Intractable pain  Right flank pain    Pt here with severe right flank pain after stent placement earlier today. Stent has been pulled out. Will give pain meds. Pt states she is hard IV stick. Will start with intranasal fentanyl 100mcg. I spoke with Dr. Vernie Ammonsttelin regarding pt as well. At this time will just aim for pain control and no further emergent evaluation. UA and labs from earlier today reviewed. Encourage pt to limit fluid intake. Will also give her one dose of flomax in the ED. Narcotics prn.  Pt reports no relief with fent. IV access was established. Will give dilaudid.  Pain minimally improved with initial dose of dilaudid. Will give another. Pt continues to complain of 10/10 pain, writhing in bed. Suspect at this point will have to admit for intractable pain.   I spoke to Dr. Antionette Charpyd who will admit pt to med surg for obs for intractable pain.  Brooke CoriaSerena Y Evon Dejarnett, PA-C 04/26/15 0032  Lyndal Pulleyaniel Knott, MD 04/26/15 [phone]

## 2015-04-25 NOTE — H&P (Signed)
Brooke [name] is an 40 y.o. female.    HPI: Brooke [name] is a 40 year old female patient of Dr. Andee Lineman who was seen in the emergency room for severe right renal colic and nonobstructing right UPJ stone. Her pain is currently uncontrolled. She has a history of calculus disease and when living in [place] underwent 5 prior ureteroscopic procedures for stone disease and has never undergone lithotripsy. She was admitted to the hospital for intractable right flank pain on 04/20/15 and was seen by Dr. Andee Lineman at that time. He recommended transitioning her to oral medications with follow-up as an outpatient for possible lithotripsy. She has had intermittent right flank pain and then developed what she describes as the worst flank pain she has experienced in her life. It is located in the right flank with no significant radiation. It is associated with nausea but no vomiting. She has had multiple doses of Dilaudid without control of her pain and is allergic to Toradol. She has an 8 mm stone and it is located at the right UPJ with hydronephrosis. I was contacted because of her intractable pain and have recommended surgical intervention.  Past Medical History  Diagnosis Date  . Fibromyalgia   . DDD (degenerative disc disease), cervical   . Migraine   . WPW (Wolff-Parkinson-White syndrome)   . Depression   . Anxiety   . Chiari malformation   . Scoliosis   . Degenerative disc disease, lumbar   . Hyperlipemia   . Allergic rhinitis     Past Surgical History  Procedure Laterality Date  . Knee surgery Bilateral     Right Knee x 1 - Left x3  . Cesarean section    . Tubal ligation    . Eye surgery Bilateral   . Ears Bilateral   . Hernia repair    . Vein removed Left     arm  . Herneated bowel repair       (Not in a hospital admission)  Allergies:  Allergies  Allergen Reactions  . Digoxin And Related Nausea And Vomiting    Elevated 'white count'  . Toradol [Ketorolac Tromethamine] Other  (See Comments)    Uncontrollable 'shakes'  . Latex Hives and Swelling  . Other Other (See Comments)    Any nasal sprays cause serious migranes  . Tape Itching and Rash    Please use "paper" tape    Family History  Problem Relation Age of Onset  . Migraines Mother   . CAD Father   . Migraines Daughter     Social History:  reports that she has been smoking Cigarettes.  She has been smoking about 0.25 packs per day. She has never used smokeless tobacco. She reports that she does not drink alcohol or use illicit drugs.  Review of Systems: Pertinent items are noted in HPI. A comprehensive review of systems was negative except as above  Results for orders placed or performed during the hospital encounter of 04/25/15 (from the past 48 hour(s))  Urinalysis, Routine w reflex microscopic (not at [facility])     Status: Abnormal   Collection Time: 04/25/15  4:50 AM  Result Value Ref Range   Color, Urine YELLOW YELLOW   APPearance CLOUDY (A) CLEAR   Specific Gravity, Urine 1.015 1.005 - 1.030   pH 6.0 5.0 - 8.0   Glucose, UA NEGATIVE NEGATIVE mg/dL   Hgb urine dipstick SMALL (A) NEGATIVE   Bilirubin Urine NEGATIVE NEGATIVE   Ketones, ur NEGATIVE NEGATIVE mg/dL   Protein, ur NEGATIVE  NEGATIVE mg/dL   Nitrite NEGATIVE NEGATIVE   Leukocytes, UA LARGE (A) NEGATIVE  Urine microscopic-add on     Status: Abnormal   Collection Time: 04/25/15  4:50 AM  Result Value Ref Range   Squamous Epithelial / LPF 6-30 (A) NONE SEEN   WBC, UA 6-30 0 - 5 WBC/hpf   RBC / HPF 0-5 0 - 5 RBC/hpf   Bacteria, UA RARE (A) NONE SEEN  I-stat chem 8, ed     Status: Abnormal   Collection Time: 04/25/15  5:14 AM  Result Value Ref Range   Sodium 142 135 - 145 mmol/L   Potassium 3.5 3.5 - 5.1 mmol/L   Chloride 104 101 - 111 mmol/L   BUN 11 6 - 20 mg/dL   Creatinine, Ser 1.61 0.44 - 1.00 mg/dL   Glucose, Bld 096 (H) 65 - 99 mg/dL   Calcium, Ion 0.45 (L) 1.12 - 1.23 mmol/L   TCO2 22 0 - 100 mmol/L   Hemoglobin 17.3  (H) 12.0 - 15.0 g/dL   HCT 40.9 (H) 81.1 - 91.4 %    US Renal  04/25/2015  CLINICAL DATA:  Subacute onset of bilateral flank pain. Initial encounter. EXAM: RENAL / URINARY TRACT ULTRASOUND COMPLETE COMPARISON:  CT of the abdomen and pelvis performed 04/20/2015 FINDINGS: Right Kidney: Length: 12.0 cm. Echogenicity within normal limits. There is mild right-sided hydronephrosis. A nonobstructing 4 mm stone is noted at the upper pole of the right kidney. No mass is seen. Left Kidney: Length: 11.6 cm. Echogenicity within normal limits. A 3 mm stone is noted at the upper pole of the left kidney. No mass or hydronephrosis visualized. Bladder: Relatively decompressed and not well assessed. IMPRESSION: 1. Persistent mild right-sided hydronephrosis, concerning for persistent or recurrent right ureteral obstruction. 2. Nonobstructing bilateral renal stones noted. Electronically Signed   By: Roanna Raider M.D.   On: 04/25/2015 05:34    Temp:  [97.8 F (36.6 C)] 97.8 F (36.6 C) (03/04 0219) Pulse Rate:  [52-84] 52 (03/04 0938) Resp:  [16-18] 18 (03/04 0938) BP: (100-107)/(60-73) 107/71 mmHg (03/04 0937) SpO2:  [98 %-100 %] 98 % (03/04 7829)  Physical Exam: General appearance: alert and appears stated age in obvious distress Head: Normocephalic, without obvious abnormality, atraumatic Eyes: conjunctivae/corneas clear. EOM's intact.  Oropharynx: moist mucous membranes Neck: supple, symmetrical, trachea midline Resp: normal respiratory effort Cardio: regular rate and rhythm Back: symmetric, no curvature. ROM normal. Right CVA tenderness. GI: soft, non-tender; bowel sounds normal; no masses,  no organomegaly Pelvic: deferred until surgery Extremities: extremities normal, atraumatic, no cyanosis or edema Skin: Skin color normal. No visible rashes or lesions Neurologic: Grossly normal  CT scan and renal ultrasound images were independently reviewed. She has an 8 mm stone at the right UPJ. She has a  punctate stone in the lower pole of the right kidney and a similar size stone in the left kidney but no other worrisome findings are noted.  Assessment: Right UPJ stone with obstruction and renal colic - she has been experiencing intermittent severe right flank pain. She now has return to the emergency room again with flank pain that is intractable and has not responded to parenteral narcotics. I therefore have recommended surgical intervention. We have discussed stent placement versus ureteroscopy and laser lithotripsy depending on my findings at the time of her surgery. Her urine did not appear to be infected as there was some contamination. I have gone over the procedure with her in detail and have answered all  of her questions to her satisfaction. She has undergone 5 previous ureteroscopy's and I indicated to her that this would be similar. We discussed the fact that this is an outpatient procedure and that she would be able to be discharged home later today after her surgery. We also discussed the anticipated postoperative course and she will have a stent in place. She understands and elected to proceed.  Plan: She will be scheduled for cystoscopy, right retrograde pyelogram, right ureteroscopy and laser lithotripsy versus right double-J stent placement.  Kazumi Lachney C 04/25/2015, 10:00 AM

## 2015-04-25 NOTE — ED Notes (Signed)
IV team still with pt

## 2015-04-26 ENCOUNTER — Encounter (HOSPITAL_COMMUNITY): Payer: Self-pay | Admitting: Family Medicine

## 2015-04-26 DIAGNOSIS — G894 Chronic pain syndrome: Secondary | ICD-10-CM

## 2015-04-26 DIAGNOSIS — G8929 Other chronic pain: Secondary | ICD-10-CM | POA: Diagnosis not present

## 2015-04-26 DIAGNOSIS — F329 Major depressive disorder, single episode, unspecified: Secondary | ICD-10-CM | POA: Diagnosis not present

## 2015-04-26 DIAGNOSIS — I456 Pre-excitation syndrome: Secondary | ICD-10-CM | POA: Diagnosis not present

## 2015-04-26 DIAGNOSIS — M797 Fibromyalgia: Secondary | ICD-10-CM | POA: Diagnosis not present

## 2015-04-26 DIAGNOSIS — F419 Anxiety disorder, unspecified: Secondary | ICD-10-CM

## 2015-04-26 DIAGNOSIS — N1330 Unspecified hydronephrosis: Secondary | ICD-10-CM

## 2015-04-26 DIAGNOSIS — R52 Pain, unspecified: Secondary | ICD-10-CM | POA: Diagnosis not present

## 2015-04-26 DIAGNOSIS — Z720 Tobacco use: Secondary | ICD-10-CM

## 2015-04-26 DIAGNOSIS — R109 Unspecified abdominal pain: Secondary | ICD-10-CM | POA: Insufficient documentation

## 2015-04-26 DIAGNOSIS — N132 Hydronephrosis with renal and ureteral calculous obstruction: Secondary | ICD-10-CM | POA: Diagnosis not present

## 2015-04-26 LAB — CBC WITH DIFFERENTIAL/PLATELET
BASOS ABS: 0 10*3/uL (ref 0.0–0.1)
BASOS PCT: 0 %
Eosinophils Absolute: 0 10*3/uL (ref 0.0–0.7)
Eosinophils Relative: 0 %
HEMATOCRIT: 40.1 % (ref 36.0–46.0)
Hemoglobin: 13.2 g/dL (ref 12.0–15.0)
LYMPHS PCT: 5 %
Lymphs Abs: 0.8 10*3/uL (ref 0.7–4.0)
MCH: 30 pg (ref 26.0–34.0)
MCHC: 32.9 g/dL (ref 30.0–36.0)
MCV: 91.1 fL (ref 78.0–100.0)
MONO ABS: 0.5 10*3/uL (ref 0.1–1.0)
Monocytes Relative: 3 %
NEUTROS ABS: 17.2 10*3/uL — AB (ref 1.7–7.7)
NEUTROS PCT: 92 %
PLATELETS: 232 10*3/uL (ref 150–400)
RBC: 4.4 MIL/uL (ref 3.87–5.11)
RDW: 12.7 % (ref 11.5–15.5)
WBC: 18.5 10*3/uL — AB (ref 4.0–10.5)

## 2015-04-26 LAB — PROTIME-INR
INR: 1.01 (ref 0.00–1.49)
Prothrombin Time: 13.5 seconds (ref 11.6–15.2)

## 2015-04-26 LAB — BASIC METABOLIC PANEL
ANION GAP: 8 (ref 5–15)
BUN: 11 mg/dL (ref 6–20)
CALCIUM: 9 mg/dL (ref 8.9–10.3)
CO2: 21 mmol/L — ABNORMAL LOW (ref 22–32)
Chloride: 107 mmol/L (ref 101–111)
Creatinine, Ser: 1.2 mg/dL — ABNORMAL HIGH (ref 0.44–1.00)
GFR, EST NON AFRICAN AMERICAN: 57 mL/min — AB (ref >60)
GLUCOSE: 147 mg/dL — AB (ref 65–99)
POTASSIUM: 4.5 mmol/L (ref 3.5–5.1)
SODIUM: 136 mmol/L (ref 135–145)

## 2015-04-26 LAB — GLUCOSE, CAPILLARY: GLUCOSE-CAPILLARY: 139 mg/dL — AB (ref 65–99)

## 2015-04-26 MED ORDER — TAMSULOSIN HCL 0.4 MG PO CAPS
0.4000 mg | ORAL_CAPSULE | ORAL | Status: DC
  Administered 2015-04-26: 0.4 mg via ORAL
  Filled 2015-04-26: qty 1

## 2015-04-26 MED ORDER — CLONAZEPAM 1 MG PO TABS
1.0000 mg | ORAL_TABLET | Freq: Two times a day (BID) | ORAL | Status: DC | PRN
  Administered 2015-04-26 (×2): 1 mg via ORAL
  Filled 2015-04-26 (×2): qty 1

## 2015-04-26 MED ORDER — ACETAMINOPHEN 650 MG RE SUPP: 650.0000 mg | Freq: Four times a day (QID) | RECTAL | Status: DC | PRN

## 2015-04-26 MED ORDER — ENOXAPARIN SODIUM 40 MG/0.4ML ~~LOC~~ SOLN
40.0000 mg | SUBCUTANEOUS | Status: DC
  Administered 2015-04-26 – 2015-04-27 (×2): 40 mg via SUBCUTANEOUS
  Filled 2015-04-26 (×2): qty 0.4

## 2015-04-26 MED ORDER — ACETAMINOPHEN 325 MG PO TABS: 650.0000 mg | ORAL_TABLET | Freq: Four times a day (QID) | ORAL | Status: DC | PRN

## 2015-04-26 MED ORDER — HYDROMORPHONE HCL 1 MG/ML IJ SOLN
1.0000 mg | INTRAMUSCULAR | Status: DC | PRN
  Administered 2015-04-26 – 2015-04-27 (×4): 1 mg via INTRAVENOUS
  Filled 2015-04-26 (×4): qty 1

## 2015-04-26 MED ORDER — HYDROMORPHONE HCL 2 MG/ML IJ SOLN
1.5000 mg | INTRAMUSCULAR | Status: DC | PRN
  Administered 2015-04-26: 1.5 mg via INTRAVENOUS
  Filled 2015-04-26: qty 1

## 2015-04-26 MED ORDER — SODIUM CHLORIDE 0.9 % IV SOLN
INTRAVENOUS | Status: DC
  Administered 2015-04-26: 17:00:00 via INTRAVENOUS
  Administered 2015-04-27: 100 mL/h via INTRAVENOUS
  Administered 2015-04-27: 02:00:00 via INTRAVENOUS

## 2015-04-26 MED ORDER — HYDROMORPHONE HCL 2 MG/ML IJ SOLN
1.5000 mg | INTRAMUSCULAR | Status: DC | PRN
  Administered 2015-04-26 (×3): 1.5 mg via INTRAVENOUS
  Filled 2015-04-26 (×3): qty 1

## 2015-04-26 MED ORDER — DOCUSATE SODIUM 100 MG PO CAPS
100.0000 mg | ORAL_CAPSULE | Freq: Two times a day (BID) | ORAL | Status: DC
  Administered 2015-04-26 – 2015-04-27 (×4): 100 mg via ORAL

## 2015-04-26 MED ORDER — HYDROMORPHONE HCL 1 MG/ML IJ SOLN: 1.0000 mg | INTRAMUSCULAR | Status: DC | PRN

## 2015-04-26 MED ORDER — CIPROFLOXACIN HCL 500 MG PO TABS
500.0000 mg | ORAL_TABLET | Freq: Two times a day (BID) | ORAL | Status: DC
  Administered 2015-04-26 – 2015-04-27 (×3): 500 mg via ORAL
  Filled 2015-04-26 (×5): qty 1

## 2015-04-26 MED ORDER — ENSURE ENLIVE PO LIQD
237.0000 mL | Freq: Two times a day (BID) | ORAL | Status: DC
  Administered 2015-04-26 – 2015-04-27 (×3): 237 mL via ORAL

## 2015-04-26 MED ORDER — TIZANIDINE HCL 4 MG PO TABS
4.0000 mg | ORAL_TABLET | Freq: Four times a day (QID) | ORAL | Status: DC | PRN
  Administered 2015-04-26 – 2015-04-27 (×3): 4 mg via ORAL
  Filled 2015-04-26 (×5): qty 1

## 2015-04-26 MED ORDER — BUSPIRONE HCL 15 MG PO TABS
15.0000 mg | ORAL_TABLET | Freq: Two times a day (BID) | ORAL | Status: DC
  Administered 2015-04-26 – 2015-04-27 (×4): 15 mg via ORAL
  Filled 2015-04-26 (×5): qty 1

## 2015-04-26 MED ORDER — HYDROCODONE-ACETAMINOPHEN 10-325 MG PO TABS
1.0000 | ORAL_TABLET | ORAL | Status: DC | PRN
  Administered 2015-04-26 (×4): 2 via ORAL
  Administered 2015-04-27: 1 via ORAL
  Administered 2015-04-27: 2 via ORAL
  Filled 2015-04-26 (×4): qty 2
  Filled 2015-04-26: qty 1
  Filled 2015-04-26 (×2): qty 2

## 2015-04-26 MED ORDER — ONDANSETRON HCL 4 MG/2ML IJ SOLN
4.0000 mg | Freq: Four times a day (QID) | INTRAMUSCULAR | Status: DC | PRN
Start: 2015-04-26 — End: 2015-04-27
  Administered 2015-04-27: 4 mg via INTRAVENOUS
  Filled 2015-04-26: qty 2

## 2015-04-26 MED ORDER — BISACODYL 5 MG PO TBEC: 5.0000 mg | DELAYED_RELEASE_TABLET | Freq: Every day | ORAL | Status: DC | PRN

## 2015-04-26 MED ORDER — ONDANSETRON HCL 4 MG PO TABS: 4.0000 mg | ORAL_TABLET | Freq: Four times a day (QID) | ORAL | Status: DC | PRN

## 2015-04-26 NOTE — Progress Notes (Signed)
Progress note: - Patient was admitted earlier during the day by my colleague, chart, labs, and imaging were reviewed, 40 year old female status post lithotripsy and stent placement, admitted for pain control. Physical exam: Awake, alert, We did enter bilaterally, no wheezing Abdomen soft, nontender, anticipatory tenderness Cardiovascular, S1, S2, here, murmur or gallops Extremity with no edema no clubbing Assessment and plan: - Intractable pain status post lithotripsy and stone extraction, continue with when necessary Dilaudid, and by mouth medication - Nephrolithiasis with hydronephrosis: Status post lithotripsy and stent placement,Ureteral stent with removal strings reportedly found in hospital bed, pt explaining that it fell out Huey Bienenstockawood Darcey Cardy MD

## 2015-04-26 NOTE — Progress Notes (Signed)
Patient's blood pressure was low on last check, see chart.  Patient was asymptomatic, other than drowsiness, which she has been all shift. Patient was educated on low blood pressure and use of IV pain medication.  Informed patient that we would use oral pain medication until B/P came up.  She was unhappy, but complied.

## 2015-04-26 NOTE — Progress Notes (Signed)
Patient called me into room after the MD had come, which I was present for and he spoke to her about her pain medication regimen.  MD stated that she would be discharged tomorrow on oral pain medication and that we would transition her to oral oral pain medication today.  I informed MD that she was already on oral pain medication and he said that we would discontinue the IV pain medication. Patient was present and aware of conversation with MD.  Patient is now saying that she didn't understand the conversation.  I made MD aware of the patient being unhappy with her pain medication.

## 2015-04-26 NOTE — Plan of Care (Signed)
Problem: Pain Managment: Goal: General experience of comfort will improve Outcome: Not Progressing Patient states her pain 10/10.

## 2015-04-26 NOTE — H&P (Signed)
[facility] History and Physical  [name] ZOX:[id number] DOB: [birth year] DOA: 04/25/2015  Referring physician: ED physician PCP: Brooke Plum, MD  Specialists:  Dr. Sherryl Barters, urology  Chief Complaint:  Right flank pain   HPI: Brooke [name] is a 40 y.o. female with PMH of depression, anxiety, fibromyalgia, chronic pain, and nephrolithiasis with multiple prior ureteral obstructions requiring surgical intervention who presents to the ED in severe pain following outpatient cystoscopy with laser lithotripsy, stone extraction, and stent placement earlier in the day of her presentation. Patient is had recurrent obstructing stones, previously managed in [place], and had been admitted to this institution last month with an 8 mm obstructing stone at the right UPJ. She was managed with pain control and placed on Flomax at that time and ultimately discharged home to follow up with urology as an outpatient. She returned to the ED on 04/25/2015 with severe right flank pain and was taken to the OR by Dr. Vernie Ammons of urology and cystoscopy performed under general anesthesia. Stone was identified, laser lithotripsy and extraction performed, and a ureteral stent was left in place. Patient was discharged home in much improved and stable condition from the PACU, but unable to adequately control her pain at home, she now returns to the emergency department.  Despite repeated doses of IV Dilaudid in the ED, patient continues to writhe and call out in pain. Of note, the ureteral stent was reportedly found in the hospital bed, patient reporting that it had fallen out. The ED provider discussed these events with Dr. Vernie Ammons who recommended pain control and outpatient follow-up. As pain remains poorly controlled despite aggressive use of IV narcotic, patient will be admitted under observation status for intractable pain.  Where does patient live?   At home   Can patient participate in ADLs?  Yes   Review of  Systems:   General: no fevers, chills, sweats, weight change, poor appetite, or fatigue HEENT: no blurry vision, hearing changes or sore throat Pulm: no dyspnea, cough, or wheeze CV: no chest pain or palpitations Abd: no nausea, vomiting, abdominal pain, diarrhea, or constipation. Right flank pain GU: no increased urinary frequency, or urgency. Dysuria and hematuria improving Ext: no leg edema Neuro: no focal weakness, numbness, or tingling, no vision change or hearing loss Skin: no rash, no wounds MSK: No muscle spasm, no deformity, no red, hot, or swollen joint Heme: No easy bruising or bleeding Travel history: No recent long distant travel    Allergy:  Allergies  Allergen Reactions  . Digoxin And Related Nausea And Vomiting    Elevated 'white count'  . Toradol [Ketorolac Tromethamine] Other (See Comments)    Uncontrollable 'shakes'  . Latex Hives and Swelling  . Other Other (See Comments)    Any nasal sprays cause serious migranes  . Tape Itching and Rash    Please use "paper" tape    Past Medical History  Diagnosis Date  . Fibromyalgia   . DDD (degenerative disc disease), cervical   . Migraine   . WPW (Wolff-Parkinson-White syndrome)   . Depression   . Anxiety   . Chiari malformation   . Scoliosis   . Degenerative disc disease, lumbar   . Hyperlipemia   . Allergic rhinitis     Past Surgical History  Procedure Laterality Date  . Knee surgery Bilateral     Right Knee x 1 - Left x3  . Cesarean section    . Tubal ligation    . Eye surgery Bilateral   . Ears  Bilateral   . Hernia repair    . Vein removed Left     arm  . Herneated bowel repair      Social History:  reports that she has been smoking Cigarettes.  She has been smoking about 0.25 packs per day. She has never used smokeless tobacco. She reports that she does not drink alcohol or use illicit drugs.  Family History:  Family History  Problem Relation Age of Onset  . Migraines Mother   . CAD Father    . Migraines Daughter      Prior to Admission medications   Medication Sig Start Date End Date Taking? Authorizing Provider  busPIRone (BUSPAR) 15 MG tablet Take 15 mg by mouth 2 (two) times daily.   Yes Historical Provider, MD  Butalbital-APAP-Caffeine (FIORICET) 50-300-40 MG CAPS Take 1-2 tablets by mouth every 8 (eight) hours as needed (for migraine).   Yes Historical Provider, MD  ciprofloxacin (CIPRO) 500 MG tablet Take 1 tablet (500 mg total) by mouth 2 (two) times daily. 04/22/15  Yes Jeralyn Bennett, MD  clonazePAM (KLONOPIN) 1 MG tablet Take 0.5-1 mg by mouth 2 (two) times daily. TAKES 0.5MG  IN AM AND  IN PM   Yes Historical Provider, MD  HYDROcodone-acetaminophen (NORCO) 10-325 MG tablet Take 1-2 tablets by mouth every 4 (four) hours as needed for moderate pain. Maximum dose per 24 hours - 8 pills 04/25/15  Yes Ihor Gully, MD  hydrOXYzine (VISTARIL) 25 MG capsule Take 25 mg by mouth daily as needed for anxiety.  04/09/15  Yes Historical Provider, MD  phenazopyridine (PYRIDIUM) 200 MG tablet Take 1 tablet (200 mg total) by mouth 3 (three) times daily as needed for pain. 04/25/15  Yes Ihor Gully, MD  tamsulosin (FLOMAX) 0.4 MG CAPS capsule Take 1 capsule (0.4 mg total) by mouth daily after supper. 04/25/15  Yes Ihor Gully, MD  tiZANidine (ZANAFLEX) 4 MG tablet Take 4 mg by mouth every 6 (six) hours as needed for muscle spasms.   Yes Historical Provider, MD  levonorgestrel (MIRENA) 20 MCG/24HR IUD 1 each by Intrauterine route once.    Historical Provider, MD  methocarbamol (ROBAXIN) 500 MG tablet Take 1 tablet (500 mg total) by mouth every 6 (six) hours as needed for muscle spasms. Patient not taking: Reported on 04/25/2015 04/22/15   Jeralyn Bennett, MD  Oxycodone HCl 10 MG TABS Take 1 tablet (10 mg total) by mouth 4 (four) times daily. Patient taking differently: Take 10 mg by mouth 4 (four) times daily. Not filled yet 04/22/15   Jeralyn Bennett, MD    Physical Exam: Filed Vitals:    04/26/15 0014 04/26/15 0030 04/26/15 0100 04/26/15 0133  BP:  104/65 99/62 96/58   Pulse:  75 79 78  Temp: 98.4 F (36.9 C)     TempSrc: Oral     Resp:    20  SpO2:  95% 96% 96%   General: Not in acute distress, but in apparent discomfort, writhing and calling out in pain  HEENT:       Eyes: PERRL, EOMI, no scleral icterus or conjunctival pallor.       ENT: No discharge from the ears or nose, no pharyngeal ulcers, petechiae or exudate, no tonsillar enlargement.        Neck: No JVD, no bruit, no appreciable mass Heme: No cervical adenopathy, no pallor Cardiac: S1/S2, RRR, No murmurs, No gallops or rubs. Pulm: Good air movement bilaterally. No rales, wheezing, rhonchi or rubs. Abd: Soft, nondistended, mild tenderness, no  rebound pain or gaurding, no mass or organomegaly, BS present. Ext: No LE edema bilaterally. 2+DP/PT pulse bilaterally. Musculoskeletal: No gross deformity, no red, hot, swollen joints   Skin: No rashes or wounds on exposed surfaces  Neuro: Alert, oriented X3, cranial nerves II-XII grossly intact. No focal findings Psych: Patient is not overtly psychotic, denies suicidal or homocidal ideation, no active hallucinations.  Labs on Admission:  Basic Metabolic Panel:  Recent Labs Lab 04/21/15 0755 04/25/15 0514  NA 139 142  K 3.7 3.5  CL 110 104  CO2 22  --   GLUCOSE 84 129*  BUN 7 11  CREATININE 0.55 1.00  CALCIUM 8.5*  --    Liver Function Tests: No results for input(s): AST, ALT, ALKPHOS, BILITOT, PROT, ALBUMIN in the last 168 hours. No results for input(s): LIPASE, AMYLASE in the last 168 hours. No results for input(s): AMMONIA in the last 168 hours. CBC:  Recent Labs Lab 04/20/15 1308 04/25/15 0514  WBC 7.9  --   HGB 13.7 17.3*  HCT 42.5 51.0*  MCV 90.6  --   PLT 211  --    Cardiac Enzymes: No results for input(s): CKTOTAL, CKMB, CKMBINDEX, TROPONINI in the last 168 hours.  BNP (last 3 results) No results for input(s): BNP in the last 8760  hours.  ProBNP (last 3 results) No results for input(s): PROBNP in the last 8760 hours.  CBG:  Recent Labs Lab 04/21/15 0811 04/22/15 0729  GLUCAP 80 102*    Radiological Exams on Admission: [place] Renal  04/25/2015  CLINICAL DATA:  Subacute onset of bilateral flank pain. Initial encounter. EXAM: RENAL / URINARY TRACT ULTRASOUND COMPLETE COMPARISON:  CT of the abdomen and pelvis performed 04/20/2015 FINDINGS: Right Kidney: Length: 12.0 cm. Echogenicity within normal limits. There is mild right-sided hydronephrosis. A nonobstructing 4 mm stone is noted at the upper pole of the right kidney. No mass is seen. Left Kidney: Length: 11.6 cm. Echogenicity within normal limits. A 3 mm stone is noted at the upper pole of the left kidney. No mass or hydronephrosis visualized. Bladder: Relatively decompressed and not well assessed. IMPRESSION: 1. Persistent mild right-sided hydronephrosis, concerning for persistent or recurrent right ureteral obstruction. 2. Nonobstructing bilateral renal stones noted. Electronically Signed   By: Roanna RaiderJeffery  Chang M.D.   On: 04/25/2015 05:34    EKG:  Not done in ED, will obtain as appropriate   Assessment/Plan  1. Intractable pain  - Localized to right flank following outpt lithotripsy with stone extraction and stent placement 04/25/15  - Pt was unable to achieve pain-control with oxycodone at home, remained in "10/10" pain despite repeat doses IV Dilaudid in ED  - Will continue pain-control efforts with IV Dilaudid prn, attempt to return to home regimen as soon as tolerated   2. Nephrolithiasis with hydronephrosis  - History of recurrent stones, 6 prior surgical interventions, all in [place] until 04/25/15, primarily involving Rt kidney  - 8 mm obstructing right UPJ removed with laser lithotripsy 04/25/15 by Dr. Vernie Ammonsttelin with placement of ureteral stent - Ureteral stent with removal strings reportedly found in hospital bed, pt explaining that it fell out - Dr. Vernie Ammonsttelin of urology  consulted from the ED, recommending pain-control and outpt follow-up  3. Fibromyalgia, chronic pain  - Reports being followed by pain management physician with oxycodone 10 mg, 120 tabs/mo  - Has repeated ED visits for pain, needs to be discussed with her pain-management physician, but pt unable to recall name or location at this time  4. Depression, anxiety  - Suspect this is contributing to #1  - Pt denies SI, HI, or hallucinations - Continue home-dose Buspar, prn Klonopin  - Will request SW consultation for psychosocial assessment    5. WPW syndrome  - Asymptomatic, not on antiarrhythmic  - Rate is controlled, EKG pending  - Monitor   6. Tobacco abuse  - Counseled toward cessation  - RN to provide smoking cessation information prior to discharge  - Nicotine patch available prn     DVT ppx:  SQ Lovenox     Code Status: Full code Family Communication: None at bed side.             Disposition Plan: Admit to inpatient   Date of Service 04/26/2015    Briscoe Deutscher, MD [facility] Pager [phone]  If 7PM-7AM, please contact night-coverage [email or web address] Password [email or web address] 04/26/2015, 2:07 AM

## 2015-04-27 ENCOUNTER — Encounter (HOSPITAL_COMMUNITY): Payer: Self-pay | Admitting: Urology

## 2015-04-27 DIAGNOSIS — R52 Pain, unspecified: Secondary | ICD-10-CM | POA: Diagnosis not present

## 2015-04-27 LAB — CBC
HEMATOCRIT: 36.5 % (ref 36.0–46.0)
Hemoglobin: 11.7 g/dL — ABNORMAL LOW (ref 12.0–15.0)
MCH: 29.6 pg (ref 26.0–34.0)
MCHC: 32.1 g/dL (ref 30.0–36.0)
MCV: 92.4 fL (ref 78.0–100.0)
PLATELETS: 199 10*3/uL (ref 150–400)
RBC: 3.95 MIL/uL (ref 3.87–5.11)
RDW: 13.1 % (ref 11.5–15.5)
WBC: 18.9 10*3/uL — AB (ref 4.0–10.5)

## 2015-04-27 LAB — BASIC METABOLIC PANEL
Anion gap: 6 (ref 5–15)
BUN: 15 mg/dL (ref 6–20)
CHLORIDE: 106 mmol/L (ref 101–111)
CO2: 22 mmol/L (ref 22–32)
Calcium: 8.2 mg/dL — ABNORMAL LOW (ref 8.9–10.3)
Creatinine, Ser: 1.22 mg/dL — ABNORMAL HIGH (ref 0.44–1.00)
GFR, EST NON AFRICAN AMERICAN: 56 mL/min — AB (ref >60)
Glucose, Bld: 133 mg/dL — ABNORMAL HIGH (ref 65–99)
POTASSIUM: 4.3 mmol/L (ref 3.5–5.1)
SODIUM: 134 mmol/L — AB (ref 135–145)

## 2015-04-27 LAB — GLUCOSE, CAPILLARY: Glucose-Capillary: 107 mg/dL — ABNORMAL HIGH (ref 65–99)

## 2015-04-27 MED ORDER — CIPROFLOXACIN HCL 500 MG PO TABS: 500.0000 mg | ORAL_TABLET | Freq: Two times a day (BID) | ORAL | Status: DC

## 2015-04-27 MED ORDER — FAMOTIDINE 20 MG PO TABS
20.0000 mg | ORAL_TABLET | Freq: Once | ORAL | Status: AC
  Administered 2015-04-27: 20 mg via ORAL
  Filled 2015-04-27: qty 1

## 2015-04-27 MED ORDER — HYDROMORPHONE HCL 1 MG/ML IJ SOLN
0.5000 mg | INTRAMUSCULAR | Status: DC | PRN
  Administered 2015-04-27: 0.5 mg via INTRAVENOUS
  Filled 2015-04-27: qty 1

## 2015-04-27 MED ORDER — MAGNESIUM HYDROXIDE 400 MG/5ML PO SUSP
960.0000 mL | Freq: Once | ORAL | Status: AC
  Administered 2015-04-27: 960 mL via RECTAL
  Filled 2015-04-27: qty 240

## 2015-04-27 MED ORDER — OXYCODONE HCL 5 MG PO TABS
5.0000 mg | ORAL_TABLET | ORAL | Status: DC | PRN
  Administered 2015-04-27 (×2): 5 mg via ORAL
  Filled 2015-04-27 (×2): qty 1

## 2015-04-27 MED ORDER — OXYCODONE HCL 5 MG PO TABS: 5.0000 mg | ORAL_TABLET | Freq: Four times a day (QID) | ORAL | Status: DC | PRN

## 2015-04-27 MED ORDER — SACCHAROMYCES BOULARDII 250 MG PO CAPS: 250.0000 mg | ORAL_CAPSULE | Freq: Two times a day (BID) | ORAL | Status: DC

## 2015-04-27 NOTE — Progress Notes (Addendum)
patient expressed to nurse ideas of hurting herself, went to evaluate the patient , she , denies any thoughts or ideas of hurting herself or ending her life's, reports she is just frustrated from her recurrent illness and pain, she had confirmed that she has   no intentions of hurting herself, she is not suicidal, was offered to be kept overnight, but she declined, i don't think she meets IVC criteria, D/W her mother who is here to pick her up,  patient lives with her mother, she will be discharged for her mother care who will pick her up, and will watch her. Huey Bienenstockawood Cato Liburd MD

## 2015-04-27 NOTE — Discharge Summary (Signed)
Brooke [name], is a 40 y.o. female  DOB 10/24/1975  MRN [id number].  Admission date:  04/25/2015  Admitting Physician  Briscoe Deutscherimothy S Opyd, MD  Discharge Date:  04/27/2015   Primary MD  Jackie PlumSEI-BONSU,GEORGE, MD  Recommendations for primary care physician for things to follow:  - patient to follow with urology Dr. Wynelle Linkttlin within 1 week from discharge. - Patient to follow with her pain medicine physician regarding further pain management as an outpatient, even prescription all of 5 mg of oxycodone total of 15 tablets. - He is due CBC, BMP during next visit .    Admission Diagnosis  Right flank pain [R10.9] Intractable pain [R52]   Discharge Diagnosis  Right flank pain [R10.9] Intractable pain [R52]   Principal Problem:   Intractable pain Active Problems:   WPW syndrome   Tobacco abuse   Fibromyalgia   Chronic pain disorder   Depression   Anxiety   Hydronephrosis determined by ultrasound   Right flank pain      Past Medical History  Diagnosis Date  . Fibromyalgia   . DDD (degenerative disc disease), cervical   . Migraine   . WPW (Wolff-Parkinson-White syndrome)   . Depression   . Anxiety   . Chiari malformation   . Scoliosis   . Degenerative disc disease, lumbar   . Hyperlipemia   . Allergic rhinitis     Past Surgical History  Procedure Laterality Date  . Knee surgery Bilateral     Right Knee x 1 - Left x3  . Cesarean section    . Tubal ligation    . Eye surgery Bilateral   . Ears Bilateral   . Hernia repair    . Vein removed Left     arm  . Herneated bowel repair    . Cystoscopy w/ ureteral stent placement Right 04/25/2015    Procedure: CYSTOSCOPY WITH RETROGRADE PYELOGRAM/URETERAL STENT PLACEMENT;  Surgeon: Ihor GullyMark Ottelin, MD;  Location: [facility];  Service: Urology;  Laterality: Right;  . Holmium laser application Right 04/25/2015    Procedure: HOLMIUM LASER APPLICATION;  Surgeon: Ihor GullyMark  Ottelin, MD;  Location: [facility];  Service: Urology;  Laterality: Right;       History of present illness and  Hospital Course:     Kindly see H&P for history of present illness and admission details, please review complete Labs, Consult reports and Test reports for all details in brief  HPI  from the history and physical done on the day of admission 04/26/2015  HPI: Brooke [name] is a 40 y.o. female with PMH of depression, anxiety, fibromyalgia, chronic pain, and nephrolithiasis with multiple prior ureteral obstructions requiring surgical intervention who presents to the ED in severe pain following outpatient cystoscopy with laser lithotripsy, stone extraction, and stent placement earlier in the day of her presentation. Patient is had recurrent obstructing stones, previously managed in [place], and had been admitted to this institution last month with an 8 mm obstructing stone at the right UPJ. She was managed with pain control and placed  on Flomax at that time and ultimately discharged home to follow up with urology as an outpatient. She returned to the ED on 04/25/2015 with severe right flank pain and was taken to the OR by Dr. Vernie Ammons of urology and cystoscopy performed under general anesthesia. Stone was identified, laser lithotripsy and extraction performed, and a ureteral stent was left in place. Patient was discharged home in much improved and stable condition from the PACU, but unable to adequately control her pain at home, she now returns to the emergency department.  Despite repeated doses of IV Dilaudid in the ED, patient continues to writhe and call out in pain. Of note, the ureteral stent was reportedly found in the hospital bed, patient reporting that it had fallen out. The ED provider discussed these events with Dr. Vernie Ammons who recommended pain control and outpatient follow-up. As pain remains poorly controlled despite aggressive use of IV narcotic, patient will be admitted under  observation status for intractable pain.   Hospital Course   Intractable pain status post lithotripsy with stone extraction and stent placement 04/25/2015 - She is admitted for pain control, initially on IV Dilaudid and as-needed basis, patient continues to complain of abdominal pain, and ask for IV pain medication despite being very sleepy and lethargic, as well as one point she was hypotensive, she was unhappy about discharge and transition from IV to oral pain medication, she will be discharged on oxycodone 5 mg every 4 hours as needed for pain, with instruction to follow with her pain physician as an outpatient regarding further recommendation. -  Ureteral stent with removal strings reportedly found in hospital bed, pt explaining that it fell out, and with leukocytosis, but remains afebrile, leukocytosis thought to be secondary to procedure, especially she is afebrile, and nontoxic-appearing, this was discussed with her urologist Dr. Vernie Ammons,  recommendation was to discharged on by mouth Cipro, and to follow-up with [name] as an outpatient.    Discharge Condition: stable   Follow UP  Follow-up Information    Follow up with OSEI-BONSU,GEORGE, MD. Schedule an appointment as soon as possible for a visit in 1 week.   Specialty:  Internal Medicine   Contact information:   [address] [place] [address] [phone]       Follow up with Garnett Farm, MD. Schedule an appointment as soon as possible for a visit in 1 week.   Specialty:  Urology   Contact information:   [address] [place] [address] [phone]         Discharge Instructions  and  Discharge Medications    Discharge Instructions    Discharge instructions    Complete by:  As directed   Follow with Primary MD OSEI-BONSU,GEORGE, MD in 7 days   Get CBC, CMP,  checked  by Primary MD next visit.    Activity: As tolerated with Full fall precautions use walker/cane & assistance as  needed   Disposition Home    Diet: Heart Healthy  , with feeding assistance and aspiration precautions.  For Heart failure patients - Check your Weight same time everyday, if you gain over 2 pounds, or you develop in leg swelling, experience more shortness of breath or chest pain, call your Primary MD immediately. Follow Cardiac Low Salt Diet and 1.5 lit/day fluid restriction.   On your next visit with your primary care physician please Get Medicines reviewed and adjusted.   Please request your Prim.MD to go over all Hospital Tests and Procedure/Radiological  results at the follow up, please get all Hospital records sent to your Prim MD by signing hospital release before you go home.   If you experience worsening of your admission symptoms, develop shortness of breath, life threatening emergency, suicidal or homicidal thoughts you must seek medical attention immediately by calling 911 or calling your MD immediately  if symptoms less severe.  You Must read complete instructions/literature along with all the possible adverse reactions/side effects for all the Medicines you take and that have been prescribed to you. Take any new Medicines after you have completely understood and accpet all the possible adverse reactions/side effects.   Do not drive, operating heavy machinery, perform activities at heights, swimming or participation in water activities or provide baby sitting services if your were admitted for syncope or siezures until you have seen by Primary MD or a Neurologist and advised to do so again.  Do not drive when taking Pain medications.    Do not take more than prescribed Pain, Sleep and Anxiety Medications  Special Instructions: If you have smoked or chewed Tobacco  in the last 2 yrs please stop smoking, stop any regular Alcohol  and or any Recreational drug use.  Wear Seat belts while driving.   Please note  You were cared for by a hospitalist during your hospital stay.  If you have any questions about your discharge medications or the care you received while you were in the hospital after you are discharged, you can call the unit and asked to speak with the hospitalist on call if the hospitalist that took care of you is not available. Once you are discharged, your primary care physician will handle any further medical issues. Please note that NO REFILLS for any discharge medications will be authorized once you are discharged, as it is imperative that you return to your primary care physician (or establish a relationship with a primary care physician if you do not have one) for your aftercare needs so that they can reassess your need for medications and monitor your lab values.     Increase activity slowly    Complete by:  As directed             Medication List    STOP taking these medications        HYDROcodone-acetaminophen 10-325 MG tablet  Commonly known as:  NORCO      TAKE these medications        busPIRone 15 MG tablet  Commonly known as:  BUSPAR  Take 15 mg by mouth 2 (two) times daily.     ciprofloxacin 500 MG tablet  Commonly known as:  CIPRO  Take 1 tablet (500 mg total) by mouth 2 (two) times daily.     clonazePAM 1 MG tablet  Commonly known as:  KLONOPIN  Take 0.5-1 mg by mouth 2 (two) times daily. TAKES 0.5MG  IN AM AND  IN PM     FIORICET 50-300-40 MG Caps  Generic drug:  Butalbital-APAP-Caffeine  Take 1-2 tablets by mouth every 8 (eight) hours as needed (for migraine).     hydrOXYzine 25 MG capsule  Commonly known as:  VISTARIL  Take 25 mg by mouth daily as needed for anxiety.     levonorgestrel 20 MCG/24HR IUD  Commonly known as:  MIRENA  1 each by Intrauterine route once.     methocarbamol 500 MG tablet  Commonly known as:  ROBAXIN  Take 1 tablet (500 mg total) by mouth every 6 (six) hours  as needed for muscle spasms.     oxyCODONE 5 MG immediate release tablet  Commonly known as:  ROXICODONE  Take 1 tablet (5 mg  total) by mouth every 6 (six) hours as needed for severe pain.     phenazopyridine 200 MG tablet  Commonly known as:  PYRIDIUM  Take 1 tablet (200 mg total) by mouth 3 (three) times daily as needed for pain.     saccharomyces boulardii 250 MG capsule  Commonly known as:  FLORASTOR  Take 1 capsule (250 mg total) by mouth 2 (two) times daily.     tamsulosin 0.4 MG Caps capsule  Commonly known as:  FLOMAX  Take 1 capsule (0.4 mg total) by mouth daily after supper.     tiZANidine 4 MG tablet  Commonly known as:  ZANAFLEX  Take 4 mg by mouth every 6 (six) hours as needed for muscle spasms.          Diet and Activity recommendation: See Discharge Instructions above   Consults obtained -  D/W with urology Dr Vernie Ammons via phone   Major procedures and Radiology Reports - PLEASE review detailed and final reports for all details, in brief -      Ct Abdomen Pelvis Wo Contrast  04/20/2015  CLINICAL DATA:  History of kidney stones. Right flank pain since last night EXAM: CT ABDOMEN AND PELVIS WITHOUT CONTRAST TECHNIQUE: Multidetector CT imaging of the abdomen and pelvis was performed following the standard protocol without IV contrast. COMPARISON:  CT, 06/14/2013. FINDINGS: Lung bases:  Clear.  Heart normal in size. Hepatobiliary: Calcification in the right liver lobe consistent with healed granuloma, stable. Liver otherwise unremarkable. Gallbladder surgically absent. No bile duct dilation. Spleen, pancreas, adrenal glands:  Normal. Kidneys, ureters, bladder: 8 mm stone at the right ureteropelvic junction causes mild right hydronephrosis and mild perinephric and peripelvic stranding. Small nonobstructing stone in the upper pole the right kidney. Several small nonobstructing stones in the left kidney. No renal masses. No left hydronephrosis. Normal left ureter. No other ureteral stones. Bladder is unremarkable. Uterus and adnexa:  Unremarkable. Lymph nodes:  No adenopathy. Ascites: None.  Gastrointestinal:  Unremarkable.  Normal appendix visualized. Musculoskeletal:  No significant abnormality. IMPRESSION: 1. 8 mm stone at the right ureteropelvic junction causes mild right hydronephrosis and perinephric/peripelvic stranding. 2. No other acute findings. 3. Small nonobstructing stones in each kidney. Electronically Signed   By: Amie Portland M.D.   On: 04/20/2015 18:09   US Renal  04/25/2015  CLINICAL DATA:  Subacute onset of bilateral flank pain. Initial encounter. EXAM: RENAL / URINARY TRACT ULTRASOUND COMPLETE COMPARISON:  CT of the abdomen and pelvis performed 04/20/2015 FINDINGS: Right Kidney: Length: 12.0 cm. Echogenicity within normal limits. There is mild right-sided hydronephrosis. A nonobstructing 4 mm stone is noted at the upper pole of the right kidney. No mass is seen. Left Kidney: Length: 11.6 cm. Echogenicity within normal limits. A 3 mm stone is noted at the upper pole of the left kidney. No mass or hydronephrosis visualized. Bladder: Relatively decompressed and not well assessed. IMPRESSION: 1. Persistent mild right-sided hydronephrosis, concerning for persistent or recurrent right ureteral obstruction. 2. Nonobstructing bilateral renal stones noted. Electronically Signed   By: Roanna Raider M.D.   On: 04/25/2015 05:34    Micro Results    Recent Results (from the past 240 hour(s))  Urine culture     Status: None   Collection Time: 04/20/15  4:51 PM  Result Value Ref Range Status   Specimen  Description URINE, CLEAN CATCH  Final   Special Requests Normal  Final   Culture MULTIPLE SPECIES PRESENT, SUGGEST RECOLLECTION  Final   Report Status 04/22/2015 FINAL  Final       Today   Subjective:   Brooke [name] today has no headache,no chest  pain, she complains of abdominal pain but being very sleepy  Objective:   Blood pressure 110/66, pulse 68, temperature 98.5 F (36.9 C), temperature source Oral, resp. rate 22, height  (1.626 m), weight 78.019 kg (172  lb), SpO2 99 %.   Intake/Output Summary (Last 24 hours) at 04/27/15 1229 Last data filed at 04/27/15 1100  Gross per 24 hour  Intake   1920 ml  Output    400 ml  Net   1520 ml    Exam Nontoxic appearing, sleeping comfortable in bed, [place].AT,PERRAL Supple Neck,No JVD, No cervical lymphadenopathy appriciated.  Symmetrical Chest wall movement, Good air movement bilaterally, CTAB RRR,No Gallops,Rubs or new Murmurs, No Parasternal Heave +ve B.Sounds, Abd Soft ,anticipatory tenderness,   No Cyanosis, Clubbing or edema, No new Rash or bruise  Data Review   CBC w Diff: Lab Results  Component Value Date   WBC 18.9* 04/27/2015   HGB 11.7* 04/27/2015   HCT 36.5 04/27/2015   PLT 199 04/27/2015   LYMPHOPCT 5 04/26/2015   MONOPCT 3 04/26/2015   EOSPCT 0 04/26/2015   BASOPCT 0 04/26/2015    CMP: Lab Results  Component Value Date   NA 134* 04/27/2015   K 4.3 04/27/2015   CL 106 04/27/2015   CO2 22 04/27/2015   BUN 15 04/27/2015   CREATININE 1.22* 04/27/2015   PROT 7.5 06/14/2013   ALBUMIN 3.3* 06/14/2013   BILITOT 0.4 06/14/2013   ALKPHOS 113 06/14/2013   AST 14 06/14/2013   ALT 16 06/14/2013  .   Total Time in preparing paper work, data evaluation and todays exam - 35 minutes  Bode Pieper M.D on 04/27/2015 at 12:29 PM  [facility]   Office  [phone]

## 2015-04-27 NOTE — Progress Notes (Signed)
Patient very sleepy, asking for IV Dilaudid. Patient cannot complete a sentence. Patient just received IV dilaudid 20 minutes prior.

## 2015-04-27 NOTE — Progress Notes (Signed)
Patient stated "I did not say I wanted to hurt myself" when asked. She asked to leave and requested AMA papers.

## 2015-04-27 NOTE — Care Management Note (Signed)
Case Management Note  Patient Details  Name: Brooke [name] MRN: [id number] Date of Birth: 02/26/1975  Subjective/Objective:  40 y/o f admitted w/intractable pain. From home.                  Action/Plan:d/c home no needs or orders.   Expected Discharge Date:                 Expected Discharge Plan:  Home/Self Care  In-House Referral:     Discharge planning Services  CM Consult  Post Acute Care Choice:    Choice offered to:     DME Arranged:    DME Agency:     HH Arranged:    HH Agency:     Status of Service:  Completed, signed off  Medicare Important Message Given:    Date Medicare IM Given:    Medicare IM give by:    Date Additional Medicare IM Given:    Additional Medicare Important Message give by:     If discussed at Long Length of Stay Meetings, dates discussed:    Additional Comments:  Lanier ClamMahabir, Ulices Maack, RN 04/27/2015, 1:10 PM

## 2015-04-27 NOTE — Progress Notes (Signed)
Spoke with Dr. Lenon CurtElgergawgy, states that he will not be making changes to her pain medication, and that the patient is to be discharged as he discussed with the patient this morning.  Pain management plan is for the patient to follow up with the pain management clinic that she is seeing currently.  Spoke with patient regarding discharged and them aware that the plan will be to discharge home today.  Patient request second opinion, and I let her know that I will make the MD aware of this request.

## 2015-04-27 NOTE — Discharge Instructions (Signed)
Follow with Primary MD OSEI-BONSU,GEORGE, MD in 7 days   Get CBC, CMP,  checked  by Primary MD next visit.    Activity: As tolerated with Full fall precautions use walker/cane & assistance as needed   Disposition Home    Diet: Heart Healthy  , with feeding assistance and aspiration precautions.  For Heart failure patients - Check your Weight same time everyday, if you gain over 2 pounds, or you develop in leg swelling, experience more shortness of breath or chest pain, call your Primary MD immediately. Follow Cardiac Low Salt Diet and 1.5 lit/day fluid restriction.   On your next visit with your primary care physician please Get Medicines reviewed and adjusted.   Please request your Prim.MD to go over all Hospital Tests and Procedure/Radiological results at the follow up, please get all Hospital records sent to your Prim MD by signing hospital release before you go home.   If you experience worsening of your admission symptoms, develop shortness of breath, life threatening emergency, suicidal or homicidal thoughts you must seek medical attention immediately by calling 911 or calling your MD immediately  if symptoms less severe.  You Must read complete instructions/literature along with all the possible adverse reactions/side effects for all the Medicines you take and that have been prescribed to you. Take any new Medicines after you have completely understood and accpet all the possible adverse reactions/side effects.   Do not drive, operating heavy machinery, perform activities at heights, swimming or participation in water activities or provide baby sitting services if your were admitted for syncope or siezures until you have seen by Primary MD or a Neurologist and advised to do so again.  Do not drive when taking Pain medications.    Do not take more than prescribed Pain, Sleep and Anxiety Medications  Special Instructions: If you have smoked or chewed Tobacco  in the last 2 yrs  please stop smoking, stop any regular Alcohol  and or any Recreational drug use.  Wear Seat belts while driving.   Please note  You were cared for by a hospitalist during your hospital stay. If you have any questions about your discharge medications or the care you received while you were in the hospital after you are discharged, you can call the unit and asked to speak with the hospitalist on call if the hospitalist that took care of you is not available. Once you are discharged, your primary care physician will handle any further medical issues. Please note that NO REFILLS for any discharge medications will be authorized once you are discharged, as it is imperative that you return to your primary care physician (or establish a relationship with a primary care physician if you do not have one) for your aftercare needs so that they can reassess your need for medications and monitor your lab values.

## 2015-04-27 NOTE — Progress Notes (Signed)
[facility] PROGRESS NOTE   Brooke [name] ZOX:[id number] DOB: 1975/03/11 DOA: 04/25/2015 PCP: Jackie Plum, MD  HPI/Subjective: Patient seen with her mother at bedside. She was tearing and grimacing  Assessment/Plan: Principal Problem:   Intractable pain Active Problems:   WPW syndrome   Tobacco abuse   Fibromyalgia   Chronic pain disorder   Depression   Anxiety   Hydronephrosis determined by ultrasound   Right flank pain    I have been asked by Dr. Vertell Novak to see Mrs. Brooke [name], for second opinion for intractable pain. Patient seen and examined, and data base reviewed. Patient seen by Dr. Vernie Ammons on 04/25/2015 with stone removal and placed right-sided ureteral stent. I have examined her, she is having clear dramatic and exaggerated response to my examination. She was screaming and tearing whenever I even touch her skin and says she is hurting so bad. She does not have tachycardia, does not have high BP, she ate 100% of her last meal. I told her if she has the stone extracted already I am not sure where her pain coming from.  When I explained to her I am not sure, where the pain coming from, she told me she did not have bowel movement for 2 weeks. I have asked the nursing staff it seems she is telling different story to different providers. I have asked nursing staff to give her SMOG enema, stop Dilaudid. I agree with discharge today, patient to follow-up with Dr. Vernie Ammons as outpatient.   Code Status: Full Code Family Communication: Plan discussed with the patient. Disposition Plan: Remains inpatient Diet: Diet regular Room service appropriate?: Yes; Fluid consistency:: Thin  Consultants:    Procedures:    Antibiotics:     Objective: Filed Vitals:   04/27/15 0600 04/27/15 1346  BP: 110/66 107/46  Pulse: 68 74  Temp: 98.5 F (36.9 C) 99.2 F (37.3 C)  Resp: 22 18    Intake/Output Summary (Last 24 hours) at 04/27/15 1446 Last data filed  at 04/27/15 1445  Gross per 24 hour  Intake   2040 ml  Output    650 ml  Net   1390 ml   Filed Weights   04/26/15 0220  Weight: 78.019 kg (172 lb)    Exam: General: Alert and awake, oriented x3, not in any acute distress. HEENT: anicteric sclera, pupils reactive to light and accommodation, EOMI CVS: S1-S2 clear, no murmur rubs or gallops Chest: clear to auscultation bilaterally, no wheezing, rales or rhonchi Abdomen: soft nontender, nondistended, normal bowel sounds, no organomegaly Extremities: no cyanosis, clubbing or edema noted bilaterally Neuro: Cranial nerves II-XII intact, no focal neurological deficits  Data Reviewed: Basic Metabolic Panel:  Recent Labs Lab 04/21/15 0755 04/25/15 0514 04/26/15 0524 04/27/15 0412  NA 139 142 136 134*  K 3.7 3.5 4.5 4.3  CL 110 104 107 106  CO2 22  --  21* 22  GLUCOSE 84 129* 147* 133*  BUN CREATININE 0.55 1.00 1.20* 1.22*  CALCIUM 8.5*  --  9.0 8.2*   Liver Function Tests: No results for input(s): AST, ALT, ALKPHOS, BILITOT, PROT, ALBUMIN in the last 168 hours. No results for input(s): LIPASE, AMYLASE in the last 168 hours. No results for input(s): AMMONIA in the last 168 hours. CBC:  Recent Labs Lab 04/25/15 0514 04/26/15 0524 04/27/15 0412  WBC  --  18.5* 18.9*  NEUTROABS  --  17.2*  --   HGB 17.3* 13.2 11.7*  HCT 51.0* 40.1 36.5  MCV  --  91.1 92.4  PLT  --  232 199   Cardiac Enzymes: No results for input(s): CKTOTAL, CKMB, CKMBINDEX, TROPONINI in the last 168 hours. BNP (last 3 results) No results for input(s): BNP in the last 8760 hours.  ProBNP (last 3 results) No results for input(s): PROBNP in the last 8760 hours.  CBG:  Recent Labs Lab 04/21/15 0811 04/22/15 0729 04/26/15 0835 04/27/15 0753  GLUCAP 80 102* 139* 107*    Micro Recent Results (from the past 240 hour(s))  Urine culture     Status: None   Collection Time: 04/20/15  4:51 PM  Result Value Ref Range Status   Specimen  Description URINE, CLEAN CATCH  Final   Special Requests Normal  Final   Culture MULTIPLE SPECIES PRESENT, SUGGEST RECOLLECTION  Final   Report Status 04/22/2015 FINAL  Final     Studies: No results found.  Scheduled Meds: . busPIRone  15 mg Oral BID  . ciprofloxacin  500 mg Oral BID  . docusate sodium  100 mg Oral BID  . enoxaparin (LOVENOX) injection  40 mg Subcutaneous Q24H  . feeding supplement (ENSURE ENLIVE)  237 mL Oral BID BM  . sorbitol, milk of mag, mineral oil, glycerin (SMOG) enema  960 mL Rectal Once  . tamsulosin  0.4 mg Oral PC supper   Continuous Infusions: . sodium chloride 100 mL/hr (04/27/15 0750)       Time spent: 35 minutes    [facility] Surgical Suites LLCELMAHI,Matheson Vandehei A  [facility] Pager [phone] If 7PM-7AM, please contact night-coverage at [email or web address], password [email or web address] 04/27/2015, 2:46 PM

## 2015-04-27 NOTE — Progress Notes (Signed)
RN giving patient medication at 1645. Patient states "I just want to 'fucking' end my life, i'm so miserable"  RN reported patient statement to assigned RN, and Hazle NordmannAmy Fargo, RN alerted MD of patient statement.

## 2015-05-01 ENCOUNTER — Ambulatory Visit: Payer: Medicaid Other | Admitting: Gastroenterology

## 2015-05-18 ENCOUNTER — Telehealth: Payer: Self-pay | Admitting: *Deleted

## 2015-05-18 ENCOUNTER — Ambulatory Visit: Payer: Medicaid Other | Admitting: Neurology

## 2015-05-18 NOTE — Telephone Encounter (Signed)
No show - appt today for memory loss (referred for new problem) - last seen 03/26/14 for migraines.

## 2015-05-19 ENCOUNTER — Encounter: Payer: Self-pay | Admitting: Neurology

## 2015-05-22 ENCOUNTER — Ambulatory Visit: Payer: Medicaid Other | Admitting: Gastroenterology

## 2015-06-12 ENCOUNTER — Encounter (HOSPITAL_COMMUNITY): Payer: Self-pay

## 2015-06-12 ENCOUNTER — Emergency Department (HOSPITAL_COMMUNITY)
Admission: EM | Admit: 2015-06-12 | Discharge: 2015-06-13 | Disposition: A | Discharge status: Home / Self Care | Payer: Medicaid Other | Attending: Emergency Medicine | Admitting: Emergency Medicine

## 2015-06-12 DIAGNOSIS — Q0700 Arnold-Chiari syndrome without spina bifida or hydrocephalus: Secondary | ICD-10-CM | POA: Insufficient documentation

## 2015-06-12 DIAGNOSIS — G8929 Other chronic pain: Secondary | ICD-10-CM | POA: Insufficient documentation

## 2015-06-12 DIAGNOSIS — F1310 Sedative, hypnotic or anxiolytic abuse, uncomplicated: Secondary | ICD-10-CM | POA: Insufficient documentation

## 2015-06-12 DIAGNOSIS — R002 Palpitations: Secondary | ICD-10-CM | POA: Insufficient documentation

## 2015-06-12 DIAGNOSIS — Z91040 Latex allergy status: Secondary | ICD-10-CM | POA: Diagnosis not present

## 2015-06-12 DIAGNOSIS — F419 Anxiety disorder, unspecified: Secondary | ICD-10-CM | POA: Insufficient documentation

## 2015-06-12 DIAGNOSIS — Z8639 Personal history of other endocrine, nutritional and metabolic disease: Secondary | ICD-10-CM | POA: Insufficient documentation

## 2015-06-12 DIAGNOSIS — Z792 Long term (current) use of antibiotics: Secondary | ICD-10-CM | POA: Diagnosis not present

## 2015-06-12 DIAGNOSIS — F329 Major depressive disorder, single episode, unspecified: Secondary | ICD-10-CM | POA: Insufficient documentation

## 2015-06-12 DIAGNOSIS — Z8709 Personal history of other diseases of the respiratory system: Secondary | ICD-10-CM | POA: Insufficient documentation

## 2015-06-12 DIAGNOSIS — M419 Scoliosis, unspecified: Secondary | ICD-10-CM | POA: Insufficient documentation

## 2015-06-12 DIAGNOSIS — R51 Headache: Secondary | ICD-10-CM | POA: Diagnosis not present

## 2015-06-12 DIAGNOSIS — Z8679 Personal history of other diseases of the circulatory system: Secondary | ICD-10-CM | POA: Insufficient documentation

## 2015-06-12 DIAGNOSIS — F17210 Nicotine dependence, cigarettes, uncomplicated: Secondary | ICD-10-CM | POA: Diagnosis not present

## 2015-06-12 DIAGNOSIS — M797 Fibromyalgia: Secondary | ICD-10-CM | POA: Diagnosis not present

## 2015-06-12 DIAGNOSIS — Z79899 Other long term (current) drug therapy: Secondary | ICD-10-CM | POA: Insufficient documentation

## 2015-06-12 DIAGNOSIS — R6883 Chills (without fever): Secondary | ICD-10-CM | POA: Insufficient documentation

## 2015-06-12 DIAGNOSIS — R110 Nausea: Secondary | ICD-10-CM | POA: Diagnosis present

## 2015-06-12 LAB — CBC
HCT: 44.4 % (ref 36.0–46.0)
HEMOGLOBIN: 14.3 g/dL (ref 12.0–15.0)
MCH: 28.9 pg (ref 26.0–34.0)
MCHC: 32.2 g/dL (ref 30.0–36.0)
MCV: 89.9 fL (ref 78.0–100.0)
PLATELETS: 238 10*3/uL (ref 150–400)
RBC: 4.94 MIL/uL (ref 3.87–5.11)
RDW: 13.9 % (ref 11.5–15.5)
WBC: 12 10*3/uL — ABNORMAL HIGH (ref 4.0–10.5)

## 2015-06-12 LAB — RAPID URINE DRUG SCREEN, HOSP PERFORMED
Amphetamines: NOT DETECTED
Barbiturates: POSITIVE — AB
Benzodiazepines: POSITIVE — AB
COCAINE: NOT DETECTED
OPIATES: NOT DETECTED
TETRAHYDROCANNABINOL: NOT DETECTED

## 2015-06-12 LAB — COMPREHENSIVE METABOLIC PANEL
ALK PHOS: 99 U/L (ref 38–126)
ALT: 16 U/L (ref 14–54)
ANION GAP: 13 (ref 5–15)
AST: 21 U/L (ref 15–41)
Albumin: 4.3 g/dL (ref 3.5–5.0)
BILIRUBIN TOTAL: 0.7 mg/dL (ref 0.3–1.2)
BUN: 7 mg/dL (ref 6–20)
CALCIUM: 9.6 mg/dL (ref 8.9–10.3)
CO2: 19 mmol/L — ABNORMAL LOW (ref 22–32)
CREATININE: 0.78 mg/dL (ref 0.44–1.00)
Chloride: 108 mmol/L (ref 101–111)
GFR calc non Af Amer: >60 mL/min (ref >60)
GLUCOSE: 108 mg/dL — AB (ref 65–99)
Potassium: 3.7 mmol/L (ref 3.5–5.1)
Sodium: 140 mmol/L (ref 135–145)
TOTAL PROTEIN: 7.9 g/dL (ref 6.5–8.1)

## 2015-06-12 LAB — ETHANOL: Alcohol, Ethyl (B): <5 mg/dL (ref <5)

## 2015-06-12 MED ORDER — DIPHENHYDRAMINE HCL 25 MG PO CAPS
25.0000 mg | ORAL_CAPSULE | Freq: Once | ORAL | Status: DC
  Filled 2015-06-12: qty 1

## 2015-06-12 MED ORDER — METOCLOPRAMIDE HCL 5 MG/ML IJ SOLN
10.0000 mg | Freq: Once | INTRAMUSCULAR | Status: DC
  Filled 2015-06-12: qty 2

## 2015-06-12 NOTE — ED Notes (Signed)
Pt. Takes Oxycodone  10 mg QID  And has been on this medication for 3 months.  In the last 3 days she has not been able to get an appointment and she has not taken any medication.  Shedoes not want to stop her pain meds but feels very sick from not taking the meds.    Pt. Is having nausea , denies any vomiting.  Pt. Feels weak and sick.  Pt. Denies any sob or chest pain.  Her PCP recommend she comes for detox.  She also reports having fluttering.  Pt. Is having pain all over and a headache. Skin is warm and dry.  Pt. Is alert and oriented X4.

## 2015-06-13 MED ORDER — ONDANSETRON HCL 4 MG PO TABS: 4.0000 mg | ORAL_TABLET | Freq: Four times a day (QID) | ORAL | Status: DC

## 2015-06-13 MED ORDER — CLONIDINE HCL 0.1 MG PO TABS: 0.1000 mg | ORAL_TABLET | Freq: Three times a day (TID) | ORAL | Status: DC

## 2015-06-13 NOTE — ED Provider Notes (Signed)
CSN: [id number]     Arrival date & time 06/12/15  1803 History   First MD Initiated Contact with Patient 06/12/15 2304     Chief Complaint  Patient presents with  . Drug Problem     (Consider location/radiation/quality/duration/timing/severity/associated sxs/prior Treatment) HPI Comments: Patient presents with complaint of feeling like she is withdrawing from opiates. She has been on oxycodone chronically for months, managed by [facility]. She reports she is in the process of transferring from [place] to another pain management clinic but transfer has not been completed. She ran out of her oxycodone 3 days ago and states that her primary care physician sent her to the emergency department for admission for opiate withdrawal. She reports hot/cold chills, intermittent palpitations, nausea without vomiting. She denies SOB, fever, confusion.   Patient is a 40 y.o. female presenting with drug problem. The history is provided by the patient. No language interpreter was used.  Drug Problem This is a chronic problem. Associated symptoms include chills and nausea. Pertinent negatives include no fever or vomiting.    Past Medical History  Diagnosis Date  . Fibromyalgia   . DDD (degenerative disc disease), cervical   . Migraine   . WPW (Wolff-Parkinson-White syndrome)   . Depression   . Anxiety   . Chiari malformation   . Scoliosis   . Degenerative disc disease, lumbar   . Hyperlipemia   . Allergic rhinitis    Past Surgical History  Procedure Laterality Date  . Knee surgery Bilateral     Right Knee x 1 - Left x3  . Cesarean section    . Tubal ligation    . Eye surgery Bilateral   . Ears Bilateral   . Hernia repair    . Vein removed Left     arm  . Herneated bowel repair    . Cystoscopy w/ ureteral stent placement Right 04/25/2015    Procedure: CYSTOSCOPY WITH RETROGRADE PYELOGRAM/URETERAL STENT PLACEMENT;  Surgeon: Ihor Gully, MD;  Location: [facility];  Service: Urology;   Laterality: Right;  . Holmium laser application Right 04/25/2015    Procedure: HOLMIUM LASER APPLICATION;  Surgeon: Ihor Gully, MD;  Location: [facility];  Service: Urology;  Laterality: Right;   Family History  Problem Relation Age of Onset  . Migraines Mother   . CAD Father   . Migraines Daughter    Social History  Substance Use Topics  . Smoking status: Current Every Day Smoker -- 0.25 packs/day    Types: Cigarettes  . Smokeless tobacco: Never Used  . Alcohol Use: No   OB History    No data available     Review of Systems  Constitutional: Positive for chills. Negative for fever.  HENT: Negative.   Respiratory: Negative.   Cardiovascular: Positive for palpitations.  Gastrointestinal: Positive for nausea. Negative for vomiting.  Musculoskeletal: Negative.   Skin: Negative.   Neurological: Negative.  Negative for tremors and seizures.  Psychiatric/Behavioral: Negative for confusion.      Allergies  Digoxin and related; Toradol; Latex; Other; and Tape  Home Medications   Prior to Admission medications   Medication Sig Start Date End Date Taking? Authorizing Provider  busPIRone (BUSPAR) 15 MG tablet Take 15 mg by mouth 2 (two) times daily.    Historical Provider, MD  Butalbital-APAP-Caffeine (FIORICET) 50-300-40 MG CAPS Take 1-2 tablets by mouth every 8 (eight) hours as needed (for migraine).    Historical Provider, MD  ciprofloxacin (CIPRO) 500 MG tablet Take 1 tablet (500 mg  total) by mouth 2 (two) times daily. 04/27/15   Leana Roeawood S Elgergawy, MD  clonazePAM (KLONOPIN) 1 MG tablet Take 0.5-1 mg by mouth 2 (two) times daily. TAKES 0.5MG  IN AM AND 1MG  IN PM    Historical Provider, MD  cloNIDine (CATAPRES) 0.1 MG tablet Take 1 tablet (0.1 mg total) by mouth 3 (three) times daily. 06/13/15   Elpidio AnisShari Caydance Kuehnle, PA-C  hydrOXYzine (VISTARIL) 25 MG capsule Take 25 mg by mouth daily as needed for anxiety.  04/09/15   Historical Provider, MD  levonorgestrel (MIRENA) 20 MCG/24HR IUD 1 each by  Intrauterine route once.    Historical Provider, MD  methocarbamol (ROBAXIN) 500 MG tablet Take 1 tablet (500 mg total) by mouth every 6 (six) hours as needed for muscle spasms. Patient not taking: Reported on 04/25/2015 04/22/15   Jeralyn BennettEzequiel Zamora, MD  ondansetron (ZOFRAN) 4 MG tablet Take 1 tablet (4 mg total) by mouth every 6 (six) hours. 06/13/15   Elpidio AnisShari Dung Salinger, PA-C  oxyCODONE (ROXICODONE) 5 MG immediate release tablet Take 1 tablet (5 mg total) by mouth every 6 (six) hours as needed for severe pain. 04/27/15   Leana Roeawood S Elgergawy, MD  phenazopyridine (PYRIDIUM) 200 MG tablet Take 1 tablet (200 mg total) by mouth 3 (three) times daily as needed for pain. 04/25/15   Ihor GullyMark Ottelin, MD  saccharomyces boulardii (FLORASTOR) 250 MG capsule Take 1 capsule (250 mg total) by mouth 2 (two) times daily. 04/27/15   Leana Roeawood S Elgergawy, MD  tamsulosin (FLOMAX) 0.4 MG CAPS capsule Take 1 capsule (0.4 mg total) by mouth daily after supper. 04/25/15   Ihor GullyMark Ottelin, MD  tiZANidine (ZANAFLEX) 4 MG tablet Take 4 mg by mouth every 6 (six) hours as needed for muscle spasms.    Historical Provider, MD   BP 114/76 mmHg  Pulse 77  Temp(Src) 99.4 F (37.4 C) (Oral)  Resp 18  Ht 5\' 4"  (1.626 m)  Wt 73.029 kg  BMI 27.62 kg/m2  SpO2 97% Physical Exam  Constitutional: She is oriented to person, place, and time. She appears well-developed and well-nourished.  HENT:  Head: Normocephalic.  Neck: Normal range of motion. Neck supple.  Cardiovascular: Normal rate and regular rhythm.   No murmur heard. Pulmonary/Chest: Effort normal and breath sounds normal. She has no wheezes. She has no rales.  Abdominal: Soft. Bowel sounds are normal. There is no tenderness. There is no rebound and no guarding.  Musculoskeletal: Normal range of motion.  Neurological: She is alert and oriented to person, place, and time.  Skin: Skin is warm and dry. No rash noted.  Psychiatric: She has a normal mood and affect.    ED Course  Procedures  (including critical care time) Labs Review Labs Reviewed  COMPREHENSIVE METABOLIC PANEL - Abnormal; Notable for the following:    CO2 19 (*)    Glucose, Bld 108 (*)    All other components within normal limits  CBC - Abnormal; Notable for the following:    WBC 12.0 (*)    All other components within normal limits  URINE RAPID DRUG SCREEN, HOSP PERFORMED - Abnormal; Notable for the following:    Benzodiazepines POSITIVE (*)    Barbiturates POSITIVE (*)    All other components within normal limits  ETHANOL    Imaging Review No results found. I have personally reviewed and evaluated these images and lab results as part of my medical decision-making.   EKG Interpretation None      MDM   Final diagnoses:  Chronic  pain    The patient does not show any physical sign of clinical withdrawal. She is alert and oriented. Discussed that opiate withdrawal is not criteria for admission to the hospital. Also discussed that pain management for chronic pain is not managed in the emergency department. Will provide symptomatic relief of nausea. She complains of headache, like her migraine headache. Benadryl and Reglan ordered but patient refused. She insists on speaking with the physician.   Dr. Judd Lien has seen the patient. Basic care plan and safety for discharge home from a medical standpoint reiterated. The patient can be discharged and is encouraged to see PCP on Monday.    Elpidio Anis, PA-C 06/14/15 0006  Geoffery Lyons, MD 06/14/15 [phone]

## 2015-06-13 NOTE — ED Notes (Signed)
Pt refusing to allow RN to obtain vital signs.

## 2015-06-13 NOTE — ED Notes (Signed)
Pt refusing medication, demanding to speak with MD. "I want to talk to the doctor. NOT the PA, the doctor." MD notified.

## 2015-06-13 NOTE — ED Notes (Signed)
Pt continues to refuse to allow RN to obtain vital signs. MD aware.

## 2015-06-13 NOTE — Discharge Instructions (Signed)

## 2015-07-17 ENCOUNTER — Encounter (HOSPITAL_COMMUNITY): Payer: Self-pay | Admitting: *Deleted

## 2015-07-17 ENCOUNTER — Emergency Department (HOSPITAL_COMMUNITY)
Admission: EM | Admit: 2015-07-17 | Discharge: 2015-07-18 | Disposition: A | Discharge status: Home / Self Care | Payer: Medicaid Other | Attending: Emergency Medicine | Admitting: Emergency Medicine

## 2015-07-17 DIAGNOSIS — R1012 Left upper quadrant pain: Secondary | ICD-10-CM | POA: Insufficient documentation

## 2015-07-17 DIAGNOSIS — Z91040 Latex allergy status: Secondary | ICD-10-CM | POA: Diagnosis not present

## 2015-07-17 DIAGNOSIS — Z79899 Other long term (current) drug therapy: Secondary | ICD-10-CM | POA: Insufficient documentation

## 2015-07-17 DIAGNOSIS — F17210 Nicotine dependence, cigarettes, uncomplicated: Secondary | ICD-10-CM | POA: Diagnosis not present

## 2015-07-17 DIAGNOSIS — F329 Major depressive disorder, single episode, unspecified: Secondary | ICD-10-CM | POA: Insufficient documentation

## 2015-07-17 DIAGNOSIS — R197 Diarrhea, unspecified: Secondary | ICD-10-CM | POA: Insufficient documentation

## 2015-07-17 DIAGNOSIS — R1032 Left lower quadrant pain: Secondary | ICD-10-CM

## 2015-07-17 DIAGNOSIS — E785 Hyperlipidemia, unspecified: Secondary | ICD-10-CM | POA: Diagnosis not present

## 2015-07-17 LAB — URINALYSIS, ROUTINE W REFLEX MICROSCOPIC
Glucose, UA: NEGATIVE mg/dL
Hgb urine dipstick: NEGATIVE
KETONES UR: 15 mg/dL — AB
NITRITE: NEGATIVE
PH: 6 (ref 5.0–8.0)
Protein, ur: 30 mg/dL — AB
SPECIFIC GRAVITY, URINE: 1.042 — AB (ref 1.005–1.030)

## 2015-07-17 LAB — URINE MICROSCOPIC-ADD ON

## 2015-07-17 LAB — COMPREHENSIVE METABOLIC PANEL
ALBUMIN: 4.2 g/dL (ref 3.5–5.0)
ALT: 19 U/L (ref 14–54)
ANION GAP: 10 (ref 5–15)
AST: 19 U/L (ref 15–41)
Alkaline Phosphatase: 99 U/L (ref 38–126)
BILIRUBIN TOTAL: 0.5 mg/dL (ref 0.3–1.2)
BUN: 7 mg/dL (ref 6–20)
CO2: 21 mmol/L — ABNORMAL LOW (ref 22–32)
Calcium: 9.3 mg/dL (ref 8.9–10.3)
Chloride: 108 mmol/L (ref 101–111)
Creatinine, Ser: 0.74 mg/dL (ref 0.44–1.00)
Glucose, Bld: 117 mg/dL — ABNORMAL HIGH (ref 65–99)
POTASSIUM: 3.1 mmol/L — AB (ref 3.5–5.1)
SODIUM: 139 mmol/L (ref 135–145)
TOTAL PROTEIN: 7.8 g/dL (ref 6.5–8.1)

## 2015-07-17 LAB — CBC
HEMATOCRIT: 42.9 % (ref 36.0–46.0)
HEMOGLOBIN: 14 g/dL (ref 12.0–15.0)
MCH: 29.1 pg (ref 26.0–34.0)
MCHC: 32.6 g/dL (ref 30.0–36.0)
MCV: 89.2 fL (ref 78.0–100.0)
Platelets: 274 10*3/uL (ref 150–400)
RBC: 4.81 MIL/uL (ref 3.87–5.11)
RDW: 14.1 % (ref 11.5–15.5)
WBC: 10.1 10*3/uL (ref 4.0–10.5)

## 2015-07-17 LAB — LIPASE, BLOOD: Lipase: 25 U/L (ref 11–51)

## 2015-07-17 MED ORDER — ONDANSETRON HCL 4 MG/2ML IJ SOLN
4.0000 mg | Freq: Once | INTRAMUSCULAR | Status: AC
  Administered 2015-07-18: 4 mg via INTRAVENOUS
  Filled 2015-07-17: qty 2

## 2015-07-17 MED ORDER — MORPHINE SULFATE (PF) 4 MG/ML IV SOLN
4.0000 mg | Freq: Once | INTRAVENOUS | Status: AC
  Administered 2015-07-18: 4 mg via INTRAVENOUS
  Filled 2015-07-17: qty 1

## 2015-07-17 MED ORDER — SODIUM CHLORIDE 0.9 % IV BOLUS (SEPSIS)
1000.0000 mL | Freq: Once | INTRAVENOUS | Status: AC
  Administered 2015-07-18: 1000 mL via INTRAVENOUS

## 2015-07-17 NOTE — ED Notes (Signed)
The pt is c/o abd pain for 3 weeks with n v and diarrhea and she reports that she has mucous in her bms

## 2015-07-17 NOTE — ED Notes (Signed)
IV team at bedside 

## 2015-07-17 NOTE — ED Provider Notes (Signed)
CSN: [id number]     Arrival date & time 07/17/15  1906 History  By signing my name below, I, Emmanuella Mensah, attest that this documentation has been prepared under the direction and in the presence of Gilda Creasehristopher J Pollina, MD. Electronically Signed: Angelene GiovanniEmmanuella Mensah, ED Scribe. 07/17/2015. 11:18 PM.     Chief Complaint  Patient presents with  . Abdominal Pain   Patient is a 40 y.o. female presenting with abdominal pain. The history is provided by the patient. No language interpreter was used.  Abdominal Pain Pain location:  LUQ Pain radiates to:  Does not radiate Pain severity:  Moderate Onset quality:  Gradual Duration:  3 weeks Timing:  Constant Progression:  Worsening Relieved by:  None tried Worsened by:  Nothing tried Ineffective treatments:  None tried Associated symptoms: diarrhea   Associated symptoms: no fever and no vomiting    HPI Comments: Forrestine HimHolly Escareno is a 40 y.o. female with a hx of hernia repair who presents to the Emergency Department complaining of gradually worsening "stinging" constant left lower abdominal pain onset 3 weeks ago. Pt reports associated multiple episodes of non-bloody mucous loose bowel. She states that her symptoms are similar to when she had an abdominal hernia. No alleviating factors noted. Pt has not tried any medications PTA. She denies any fever or vomiting.    Past Medical History  Diagnosis Date  . Fibromyalgia   . DDD (degenerative disc disease), cervical   . Migraine   . WPW (Wolff-Parkinson-White syndrome)   . Depression   . Anxiety   . Chiari malformation   . Scoliosis   . Degenerative disc disease, lumbar   . Hyperlipemia   . Allergic rhinitis    Past Surgical History  Procedure Laterality Date  . Knee surgery Bilateral     Right Knee x 1 - Left x3  . Cesarean section    . Tubal ligation    . Eye surgery Bilateral   . Ears Bilateral   . Hernia repair    . Vein removed Left     arm  . Herneated bowel repair    .  Cystoscopy w/ ureteral stent placement Right 04/25/2015    Procedure: CYSTOSCOPY WITH RETROGRADE PYELOGRAM/URETERAL STENT PLACEMENT;  Surgeon: Ihor GullyMark Ottelin, MD;  Location: [facility];  Service: Urology;  Laterality: Right;  . Holmium laser application Right 04/25/2015    Procedure: HOLMIUM LASER APPLICATION;  Surgeon: Ihor GullyMark Ottelin, MD;  Location: [facility];  Service: Urology;  Laterality: Right;   Family History  Problem Relation Age of Onset  . Migraines Mother   . CAD Father   . Migraines Daughter    Social History  Substance Use Topics  . Smoking status: Current Every Day Smoker -- 0.25 packs/day    Types: Cigarettes  . Smokeless tobacco: Never Used  . Alcohol Use: No   OB History    No data available     Review of Systems  Constitutional: Negative for fever.  Gastrointestinal: Positive for abdominal pain and diarrhea. Negative for vomiting.  All other systems reviewed and are negative.     Allergies  Digoxin and related; Toradol; Latex; Other; and Tape  Home Medications   Prior to Admission medications   Medication Sig Start Date End Date Taking? Authorizing Provider  baclofen (LIORESAL) 10 MG tablet Take 10 mg by mouth 2 (two) times daily.   Yes Historical Provider, MD  busPIRone (BUSPAR) 15 MG tablet Take 15 mg by mouth 2 (two) times daily.   Yes  Historical Provider, MD  clonazePAM (KLONOPIN) 1 MG tablet Take 0.5-1 mg by mouth 2 (two) times daily. TAKES 0.5MG  IN AM AND  IN PM   Yes Historical Provider, MD  hydrOXYzine (VISTARIL) 25 MG capsule Take 25 mg by mouth at bedtime as needed (for sleep).  04/09/15  Yes Historical Provider, MD  levonorgestrel (MIRENA) 20 MCG/24HR IUD 1 each by Intrauterine route once.   Yes Historical Provider, MD  tapentadol (NUCYNTA) 50 MG TABS tablet Take 50 mg by mouth daily.    Yes Historical Provider, MD  dicyclomine (BENTYL) 20 MG tablet Take 1 tablet (20 mg total) by mouth 2 (two) times daily. 07/18/15   Gilda Crease, MD   diphenoxylate-atropine (LOMOTIL) 2.5-0.025 MG tablet Take 2 tablets by mouth 4 (four) times daily as needed for diarrhea or loose stools. 07/18/15   Gilda Crease, MD   BP 107/61 mmHg  Pulse 57  Temp(Src) 97.7 F (36.5 C) (Oral)  Resp 16  Wt 160 lb (72.576 kg)  SpO2 97% Physical Exam  Constitutional: She is oriented to person, place, and time. She appears well-developed and well-nourished. No distress.  HENT:  Head: Normocephalic and atraumatic.  Right Ear: Hearing normal.  Left Ear: Hearing normal.  Nose: Nose normal.  Mouth/Throat: Oropharynx is clear and moist and mucous membranes are normal.  Eyes: Conjunctivae and EOM are normal. Pupils are equal, round, and reactive to light.  Neck: Normal range of motion. Neck supple.  Cardiovascular: Regular rhythm, S1 normal and S2 normal.  Exam reveals no gallop and no friction rub.   No murmur heard. Pulmonary/Chest: Effort normal and breath sounds normal. No respiratory distress. She exhibits no tenderness.  Abdominal: Soft. Normal appearance and bowel sounds are normal. There is no hepatosplenomegaly. There is tenderness. There is no rebound, no guarding, no tenderness at McBurney's point and negative Murphy's sign. No hernia.  Left lower abdominal tenderness  Musculoskeletal: Normal range of motion.  Neurological: She is alert and oriented to person, place, and time. She has normal strength. No cranial nerve deficit or sensory deficit. Coordination normal. GCS eye subscore is 4. GCS verbal subscore is 5. GCS motor subscore is 6.  Skin: Skin is warm, dry and intact. No rash noted. No cyanosis.  Psychiatric: She has a normal mood and affect. Her speech is normal and behavior is normal. Thought content normal.  Nursing note and vitals reviewed.   ED Course  Procedures (including critical care time) DIAGNOSTIC STUDIES: Oxygen Saturation is 97% on RA, normal by my interpretation.    COORDINATION OF CARE: 6:51 AM- Pt advised of  plan for treatment and pt agrees. Pt informed of lab results. She will receive IV fluids and Morphine IM with Zofran IM. She will also receive CT abdomen.    Labs Review Labs Reviewed  COMPREHENSIVE METABOLIC PANEL - Abnormal; Notable for the following:    Potassium 3.1 (*)    CO2 21 (*)    Glucose, Bld 117 (*)    All other components within normal limits  URINALYSIS, ROUTINE W REFLEX MICROSCOPIC (NOT AT [facility]) - Abnormal; Notable for the following:    Color, Urine AMBER (*)    APPearance HAZY (*)    Specific Gravity, Urine 1.042 (*)    Bilirubin Urine SMALL (*)    Ketones, ur 15 (*)    Protein, ur 30 (*)    Leukocytes, UA SMALL (*)    All other components within normal limits  URINE MICROSCOPIC-ADD ON - Abnormal; Notable for  the following:    Squamous Epithelial / LPF 6-30 (*)    Bacteria, UA FEW (*)    Casts HYALINE CASTS (*)    All other components within normal limits  LIPASE, BLOOD  CBC  PREGNANCY, URINE    Imaging Review Ct Abdomen Pelvis W Contrast  07/18/2015  CLINICAL DATA:  Left lower quadrant abdominal pain for 1 month. EXAM: CT ABDOMEN AND PELVIS WITH CONTRAST TECHNIQUE: Multidetector CT imaging of the abdomen and pelvis was performed using the standard protocol following bolus administration of intravenous contrast. CONTRAST:  ISOVUE-300 IOPAMIDOL (ISOVUE-300) INJECTION 61% COMPARISON:  Most recent CT 05/22/2015 FINDINGS: Lower chest:  The included lung bases are clear. Liver: Focal fatty infiltration adjacent with falciform ligament. Punctate granuloma in the right lobe. Subcapsular lesion in the right lobe with peripheral puddling most consistent with hemangioma, unchanged measuring 2.9 cm. Hepatobiliary: Clips in the gallbladder fossa postcholecystectomy. No biliary dilatation. Pancreas: No ductal dilatation or inflammation. Spleen: Normal. Adrenal glands: No nodule. Kidneys: Symmetric renal enhancement. No hydronephrosis. The punctate stones in both kidneys are not  visualized secondary to contrast administration. Stomach/Bowel: Stomach is decompressed. There are no dilated or thickened small bowel loops. Small volume of stool throughout the colon without colonic wall thickening. The appendix is normal. Vascular/Lymphatic: No retroperitoneal adenopathy. Abdominal aorta is normal in caliber. Reproductive: Intrauterine device in the uterus. There is a 3.8 cm right ovarian cyst. Left ovary normal in size. Bladder: Minimally distended, no wall thickening. Other: No free air, free fluid, or intra-abdominal fluid collection. No evidence of hernia. Musculoskeletal: There are no acute or suspicious osseous abnormalities. IMPRESSION: No acute abnormality in the abdomen/pelvis. Electronically Signed   By: Rubye Oaks M.D.   On: 07/18/2015 04:56     Gilda Crease, MD has personally reviewed and evaluated these images and lab results as part of his medical decision-making.   EKG Interpretation None      MDM   Final diagnoses:  Left lower quadrant pain    Patient presents to the emergency department for evaluation of abdominal pain. Patient reports that she has been having ongoing issues with left-sided diffuse abdominal pain for the last 3 weeks. Patient reports that she has been experiencing diarrhea and nausea associated with the symptoms. Patient has a history of umbilical hernia repair. She reports that she had similar pain when she had her hernia. She has not had any hematemesis, rectal bleeding or melanotic stools. She is afebrile. Blood pressure is normal. Blood work was unremarkable. Urinalysis does not suggest infection. Patient underwent CT scan to further evaluate. CT scan did not show any acute abnormality. Patient has been provided analgesia here in the ER and will be discharged with continued empiric treatment.  I personally performed the services described in this documentation, which was scribed in my presence. The recorded information has  been reviewed and is accurate.    Gilda Crease, MD 07/18/15 [phone]

## 2015-07-17 NOTE — ED Notes (Addendum)
Pt c/o burning L sided abdominal pain x 3 weeks, worsening over the past few days. Unable to tolerate fluids at home. Pt has been taking pain medication without relief. Reports pain is similar to having "herinated bowel."

## 2015-07-17 NOTE — ED Notes (Signed)
IV attempted without success. 

## 2015-07-18 ENCOUNTER — Emergency Department (HOSPITAL_COMMUNITY): Payer: Medicaid Other

## 2015-07-18 LAB — PREGNANCY, URINE: Preg Test, Ur: NEGATIVE

## 2015-07-18 MED ORDER — MORPHINE SULFATE (PF) 4 MG/ML IV SOLN
4.0000 mg | Freq: Once | INTRAVENOUS | Status: AC
  Administered 2015-07-18: 4 mg via INTRAVENOUS
  Filled 2015-07-18: qty 1

## 2015-07-18 MED ORDER — DIPHENOXYLATE-ATROPINE 2.5-0.025 MG PO TABS: 2.0000 | ORAL_TABLET | Freq: Four times a day (QID) | ORAL | Status: DC | PRN

## 2015-07-18 MED ORDER — DICYCLOMINE HCL 20 MG PO TABS
20.0000 mg | ORAL_TABLET | Freq: Two times a day (BID) | ORAL | Status: DC
Start: 2015-07-18 — End: 2015-08-20

## 2015-07-18 MED ORDER — IOPAMIDOL (ISOVUE-300) INJECTION 61%
INTRAVENOUS | Status: AC
  Administered 2015-07-18: 100 mL
  Filled 2015-07-18: qty 100

## 2015-07-18 NOTE — ED Notes (Signed)
IV placed by IV team infiltrated into R AC; Dr.Pollina made aware

## 2015-07-18 NOTE — Discharge Instructions (Signed)

## 2015-07-18 NOTE — ED Notes (Signed)
CT made aware of IV placement.

## 2015-07-18 NOTE — ED Notes (Signed)
IV team unable to obtain access. Second IV team nurse to attempt

## 2015-07-18 NOTE — ED Notes (Signed)
Dr.Pollina at bedside for US IV

## 2015-07-18 NOTE — ED Notes (Signed)
Pt calling for ride home 

## 2015-08-03 ENCOUNTER — Ambulatory Visit: Payer: Medicaid Other | Admitting: Nurse Practitioner

## 2015-08-04 ENCOUNTER — Encounter: Payer: Self-pay | Admitting: Nurse Practitioner

## 2015-08-04 ENCOUNTER — Ambulatory Visit (INDEPENDENT_AMBULATORY_CARE_PROVIDER_SITE_OTHER): Payer: Medicaid Other | Admitting: Nurse Practitioner

## 2015-08-04 VITALS — BP 110/82 | HR 80 | Ht 64.0 in | Wt 158.8 lb

## 2015-08-04 DIAGNOSIS — G43909 Migraine, unspecified, not intractable, without status migrainosus: Secondary | ICD-10-CM | POA: Diagnosis not present

## 2015-08-04 DIAGNOSIS — G894 Chronic pain syndrome: Secondary | ICD-10-CM | POA: Diagnosis not present

## 2015-08-04 NOTE — Progress Notes (Signed)
[facility]  PATIENT: Brooke [name] DOB: 1975/08/31   REASON FOR VISIT: Follow-up for migraine HISTORY FROM: Patient    HISTORY OF PRESENT ILLNESS:UPDATE 6/13/17CM Brooke [name], 40 year old female returns for follow-up. She was last seen in the office 03/26/2014 by Dr. Terrace Arabia. She has a long history of headaches with each headache lasting 1 to 2 days. She has been on multiple preventives in the past to include Topamax Depakote Inderal nortriptyline Flexeril and Fioricet. She is also on Imitrex Injections which make her headaches worse,  Zomig Maxalt and Imitrex Tablets.  When last seen by Dr. Terrace Arabia she was to remain on Inderal and nortriptyline Depakote and Relpax as needed. On return visit today she has stopped her preventive medications. She claims that Botox is the only thing that has worked for her in the past, she had this when she lived in [place]. She is also going to pain management for low back pain for facet injections . [facility] care. She returns for reevaluation    UPDATE Feb 3rd 2016:YY She continued to have frequent headache, 2-3 times each week, for at least 6 months, since August 2015, each headache lasts about 1-2 days, moderate to severe pounding headaches with associated light noise sensitivity, nauseous, sleeping helps, She has tried and failed preventive medications Topamax, Depakote, Inderal, nortriptyline, Flexeril, Has tried different abortive treatment, including Imitrex injection, sometimes make her headache worse, Zomig, Maxalt, Imitrex tablets Trigger for her headaches are environmental allergy, certain food, such as spicy food, acidic food,   UPDATE Dec 14th 2015: Since her last visit in November 3rd 2015, she went to the emergency room January 27 2014, for recurrent headaches, CAT scan of the brain was normal. Her headaches overall has much improved, Imitrex subcutaneous injection has been very  helpful, she is on preventive medications, inderal, deapkote ER, Nortriptyline,  She is the main caregiver of her grandson, who is one 62 old, her daughter is 56 years old, son is 44 years old      HISTORY Brooke Spradlinis a 40 year old right-handed female, referred by her primary care physician Dr. Julio [name] for evaluation of diffuse body achy pain, migraine  She carries a diagnosis of chronic migraine, fibromyalgia, since 2011,She complains of chronic neck pain, low back pain, joints pain, radiating pain to her left arm,  She moved from [place] to [place] in November 2013, has been under the [facility] management. Over the years, she has tried different medications, currently taking nortriptyline 50 mg every night, Inderal ER 120 mg every night as headache prevention  Previously she has tried and failed Topamax, trigger point injection, which actually made her headache worse while she was in [place], she reported frequent emergency room visit, 3 times each month, receiving Dilaudid, She was also treated with Opana, fentanyl patch, Demerol in the past. she is now taking Norco,from her pain management  She reported long-standing history of migraine, her typical migraine are right retro-orbital area severe pounding headache with associated light noise sensitivity, resting helps, lasting few hours,  But over past 1 months, she had daily moderate to severe headaches, spreading to occipital, upper nuchal region.  She has tried Fioricet, Flexeril, Norco,without help, previously she has tried different triptans, including Imitrex, Zomig, Maxalt, without helping,  She complains of few years history of bilateral feet parestheisa.increased after bearing weight , reported abnormal EMG nerve conduction study from outside facility in May 2015 consistent with peripheral neuropathy  We have reviewed  [facility] and [facility], there was evidence of  multilevel degenerative disc disease, there was no significant canal, or foraminal stenosis  REVIEW OF SYSTEMS: Full 14 system review of systems performed and notable only for those listed, all others are neg:  Constitutional: neg  Cardiovascular: neg Ear/Nose/Throat: neg  Skin: neg Eyes: Blurred vision and light sensitivity  Respiratory: neg Gastroitestinal: neg  Hematology/Lymphatic: neg  Endocrine: neg Musculoskeletal: Joint pain back pain neck pain Allergy/Immunology: neg Neurological: Headache Psychiatric: Depression and anxiety  Sleep : neg   ALLERGIES: Allergies  Allergen Reactions  . Digoxin And Related Nausea And Vomiting    Elevated 'white count'  . Toradol [Ketorolac Tromethamine] Other (See Comments)    Uncontrollable 'shakes'  . Latex Hives and Swelling  . Other Other (See Comments)    Any nasal sprays cause serious migranes  . Tape Itching and Rash    Please use "paper" tape    HOME MEDICATIONS: Outpatient Prescriptions Prior to Visit  Medication Sig Dispense Refill  . baclofen (LIORESAL) 10 MG tablet Take 10 mg by mouth 2 (two) times daily.    . busPIRone (BUSPAR) 15 MG tablet Take 15 mg by mouth 2 (two) times daily.    . clonazePAM (KLONOPIN) 1 MG tablet Take 0.5-1 mg by mouth 2 (two) times daily. Reported on 08/04/2015    . levonorgestrel (MIRENA) 20 MCG/24HR IUD 1 each by Intrauterine route once.    . tapentadol (NUCYNTA) 50 MG TABS tablet Take 50 mg by mouth daily.     Marland Kitchen. dicyclomine (BENTYL) 20 MG tablet Take 1 tablet (20 mg total) by mouth 2 (two) times daily. (Patient not taking: Reported on 08/04/2015) 20 tablet 0  . diphenoxylate-atropine (LOMOTIL) 2.5-0.025 MG tablet Take 2 tablets by mouth 4 (four) times daily as needed for diarrhea or loose stools. (Patient not taking: Reported on 08/04/2015) 30 tablet 0  . hydrOXYzine (VISTARIL) 25 MG capsule Take 25 mg by mouth at bedtime as needed (for sleep). Reported on 08/04/2015  0   No facility-administered  medications prior to visit.    PAST MEDICAL HISTORY: Past Medical History  Diagnosis Date  . Fibromyalgia   . DDD (degenerative disc disease), cervical   . Migraine   . WPW (Wolff-Parkinson-White syndrome)   . Depression   . Anxiety   . Chiari malformation   . Scoliosis   . Degenerative disc disease, lumbar   . Hyperlipemia   . Allergic rhinitis     PAST SURGICAL HISTORY: Past Surgical History  Procedure Laterality Date  . Knee surgery Bilateral     Right Knee x 1 - Left x3  . Cesarean section    . Tubal ligation    . Eye surgery Bilateral   . Ears Bilateral   . Hernia repair    . Vein removed Left     arm  . Herneated bowel repair    . Cystoscopy w/ ureteral stent placement Right 04/25/2015    Procedure: CYSTOSCOPY WITH RETROGRADE PYELOGRAM/URETERAL STENT PLACEMENT;  Surgeon: Ihor GullyMark Ottelin, MD;  Location: [facility];  Service: Urology;  Laterality: Right;  . Holmium laser application Right 04/25/2015    Procedure: HOLMIUM LASER APPLICATION;  Surgeon: Ihor GullyMark Ottelin, MD;  Location: [facility];  Service: Urology;  Laterality: Right;    FAMILY HISTORY: Family History  Problem Relation Age of Onset  . Migraines Mother   . CAD Father   . Migraines Daughter     SOCIAL HISTORY: Social  History   Social History  . Marital Status: Divorced    Spouse Name: N/A  . Number of Children: 2  . Years of Education: 12   Occupational History  .      Not working   Social History Main Topics  . Smoking status: Current Every Day Smoker -- 0.25 packs/day    Types: Cigarettes  . Smokeless tobacco: Never Used  . Alcohol Use: No  . Drug Use: No  . Sexual Activity: Not on file   Other Topics Concern  . Not on file   Social History Narrative   Patient lives at home with her two children and her mother.   Unemployed. - Patient trying to get social security.   Education high school.   Right handed.   Caffeine soda's four soda's daily.     PHYSICAL EXAM  Filed Vitals:   08/04/15  1423  BP: 110/82  Pulse: 80  Height:  (1.626 m)  Weight: 158 lb 12.8 oz (72.031 kg)   Body mass index is 27.24 kg/(m^2). Generalized: In no acute distress Neck: Supple, no carotid bruits  Musculoskeletal: No deformity  Neurological examination Mentation: Alert oriented to time, place, history taking, and causual conversation,depressed looking middle-aged female Cranial nerve II-XII: Pupils were equal round reactive to light. Extraocular movements were full. Facial sensation and strength were normal. Hearing was intact to finger rubbing bilaterally. Uvula tongue midline. Head turning and shoulder shrug and were normal and symmetric.Tongue protrusion into cheek strength was normal. Motor: Normal tone, bulk and strength. Coordination: Normal finger to nose, heel-to-shin bilaterally there was no truncal ataxia Gait: Rising up from seated position without assistance, normal stance, without trunk ataxia,  DIAGNOSTIC DATA (LABS, IMAGING, TESTING) - I reviewed patient records, labs, notes, testing and imaging myself where available.  Lab Results  Component Value Date   WBC 10.1 07/17/2015   HGB 14.0 07/17/2015   HCT 42.9 07/17/2015   MCV 89.2 07/17/2015   PLT 274 07/17/2015      Component Value Date/Time   NA 139 07/17/2015 1934   K 3.1* 07/17/2015 1934   CL 108 07/17/2015 1934   CO2 21* 07/17/2015 1934   GLUCOSE 117* 07/17/2015 1934   BUN 7 07/17/2015 1934   CREATININE 0.74 07/17/2015 1934   CALCIUM 9.3 07/17/2015 1934   PROT 7.8 07/17/2015 1934   ALBUMIN 4.2 07/17/2015 1934   AST 19 07/17/2015 1934   ALT 19 07/17/2015 1934   ALKPHOS 99 07/17/2015 1934   BILITOT 0.5 07/17/2015 1934   GFRNONAA >60 07/17/2015 1934   GFRAA >60 07/17/2015 1934    ASSESSMENT AND PLAN  40 y.o. year old female  has a past medical history of Fibromyalgia; DDD (degenerative disc disease), cervical; Migraine; WPW (Wolff-Parkinson-White syndrome); Depression; Anxiety; Chiari malformation;  Scoliosis; Degenerative disc disease, lumbar; Hyperlipemia; and Allergic rhinitis. here To follow-up for her headaches. Due to the fact that she has been on multiple preventives in the past will refer for Botox  PLAN: Set up for Botox with Dr. Carmelina Noun, [facility], [facility], APRN  [facility] [address] [place], [place] [address] [phone]

## 2015-08-04 NOTE — Patient Instructions (Signed)
Will set up for Botox evaluation with Dr. Terrace ArabiaYan

## 2015-08-11 NOTE — Progress Notes (Signed)
I have reviewed and agreed above plan. 

## 2015-08-18 ENCOUNTER — Telehealth: Payer: Self-pay | Admitting: Neurology

## 2015-08-18 NOTE — Telephone Encounter (Signed)
Patient wants to know if she got the approval for her Botox. The best number to contact patient is [phone]

## 2015-08-20 ENCOUNTER — Telehealth: Payer: Self-pay | Admitting: Neurology

## 2015-08-20 ENCOUNTER — Ambulatory Visit (INDEPENDENT_AMBULATORY_CARE_PROVIDER_SITE_OTHER): Payer: Medicaid Other | Admitting: Neurology

## 2015-08-20 ENCOUNTER — Encounter: Payer: Self-pay | Admitting: Neurology

## 2015-08-20 VITALS — BP 117/83 | HR 90 | Ht 64.0 in | Wt 160.5 lb

## 2015-08-20 DIAGNOSIS — R52 Pain, unspecified: Secondary | ICD-10-CM

## 2015-08-20 DIAGNOSIS — G43909 Migraine, unspecified, not intractable, without status migrainosus: Secondary | ICD-10-CM

## 2015-08-20 MED ORDER — FROVATRIPTAN SUCCINATE 2.5 MG PO TABS: 2.5000 mg | ORAL_TABLET | ORAL | Status: DC | PRN

## 2015-08-20 MED ORDER — ONDANSETRON 4 MG PO TBDP: 4.0000 mg | ORAL_TABLET | Freq: Three times a day (TID) | ORAL | Status: DC | PRN

## 2015-08-20 NOTE — Telephone Encounter (Signed)
Pt called inquiring if botox PA had been approved. Her appt is today at 3 with Dr Terrace ArabiaYan. Please call

## 2015-08-20 NOTE — Progress Notes (Signed)
Chief Complaint  Patient presents with  . Migraine    Botox was recommended for migraine management in 03/2014 but her insurance would not cover it, at that time.  She saw Eber Jones earlier this month and expressed interest in Botox again.  She is here today as a consultation to start treatment.     [facility]  PATIENT: Brooke [name] DOB: [birth year]   HISTORY OF PRESENT ILLNESS:  HISTORY Brooke Spradlinis a 40 year old right-handed female, referred by her primary care physician Dr. Julio Sicks for evaluation of diffuse body achy pain, migraine  She carries a diagnosis of chronic migraine, fibromyalgia since 2011, she complains of chronic neck pain, low back pain, joints pain, radiating pain to her left arm,  She moved from [place] to [place] in November 2013, has been under the [facility] pain management. Over the years, she has tried different medications,   Previously she has tried and failed Topamax, trigger point injection, which actually made her headache worse while she was in [place], she reported frequent emergency room visit, 3 times each month, receiving Dilaudid, She was also treated with Opana, fentanyl patch, Demerol in the past. she is now taking Norco,from her pain management  She reported long-standing history of migraine, her typical migraine are right retro-orbital area severe pounding headache with associated light noise sensitivity, resting helps, lasting few hours,  She has tried Fioricet, Flexeril, Norco,without help, previously she has tried different triptans, including Imitrex, Zomig, Maxalt, without helping,  She has tried and failed preventive medications Topamax, Depakote, Inderal, nortriptyline, Flexeril,  Has tried different abortive treatment, including Imitrex injection, sometimes make her headache worse, Zomig, Maxalt, Imitrex tablets,   Trigger for her headaches are environmental allergy, certain food, such as spicy food,  acidic food,  She complains of few years history of bilateral feet parestheisa.increased after bearing weight , reported abnormal EMG nerve conduction study from outside facility in May 2015 consistent with peripheral neuropathy  We have reviewed MRI of cervical, and lumbar spine at [facility] in June 2015, there was evidence of multilevel degenerative disc disease, there was no significant canal, or foraminal stenosis  She is now currently taking Nucynta ER 150 mg every day for chronic migraine headache, she also daily Excedrin Migraine for her headaches, up to 4 tablets each day,   Migraine Frequency: daily since 2015, increased frequency and severity since Nucynta ER dosage was increased from 100 to  dose was increased in June 2017. She has severe headache was associated light, noise sensitivity, had to stay in dark quiet room, have her sunglasses even indoor.  Migraine Last from 5 hours to 12 hours.  REVIEW OF SYSTEMS: Full 14 system review of systems performed and notable only for those listed, all others are neg: Headaches   ALLERGIES: Allergies  Allergen Reactions  . Digoxin And Related Nausea And Vomiting    Elevated 'white count'  . Toradol [Ketorolac Tromethamine] Other (See Comments)    Uncontrollable 'shakes'  . Latex Hives and Swelling  . Other Other (See Comments)    Any nasal sprays cause serious migranes  . Tape Itching and Rash    Please use "paper" tape    HOME MEDICATIONS: Outpatient Prescriptions Prior to Visit  Medication Sig Dispense Refill  . baclofen (LIORESAL) 10 MG tablet Take 10 mg by mouth 2 (two) times daily.    . busPIRone (BUSPAR) 15 MG tablet Take 15 mg by mouth 2 (two) times daily.    . clonazePAM (KLONOPIN) 1 MG tablet  Take 0.5-1 mg by mouth 2 (two) times daily. Reported on 08/04/2015    . diphenoxylate-atropine (LOMOTIL) 2.5-0.025 MG tablet Take 2 tablets by mouth 4 (four) times daily as needed for diarrhea or loose stools. 30 tablet 0  .  hydrOXYzine (VISTARIL) 25 MG capsule Take 25 mg by mouth at bedtime as needed (for sleep). Reported on 08/04/2015  0  . levonorgestrel (MIRENA) 20 MCG/24HR IUD 1 each by Intrauterine route once.    . dicyclomine (BENTYL) 20 MG tablet Take 1 tablet (20 mg total) by mouth 2 (two) times daily. (Patient not taking: Reported on 08/04/2015) 20 tablet 0  . NUCYNTA ER 100 MG TB12 Take 1 tablet by mouth daily.  0   No facility-administered medications prior to visit.    PAST MEDICAL HISTORY: Past Medical History  Diagnosis Date  . Fibromyalgia   . DDD (degenerative disc disease), cervical   . Migraine   . WPW (Wolff-Parkinson-White syndrome)   . Depression   . Anxiety   . Chiari malformation   . Scoliosis   . Degenerative disc disease, lumbar   . Hyperlipemia   . Allergic rhinitis     PAST SURGICAL HISTORY: Past Surgical History  Procedure Laterality Date  . Knee surgery Bilateral     Right Knee x 1 - Left x3  . Cesarean section    . Tubal ligation    . Eye surgery Bilateral   . Ears Bilateral   . Hernia repair    . Vein removed Left     arm  . Herneated bowel repair    . Cystoscopy w/ ureteral stent placement Right 04/25/2015    Procedure: CYSTOSCOPY WITH RETROGRADE PYELOGRAM/URETERAL STENT PLACEMENT;  Surgeon: Ihor GullyMark Ottelin, MD;  Location: [facility];  Service: Urology;  Laterality: Right;  . Holmium laser application Right 04/25/2015    Procedure: HOLMIUM LASER APPLICATION;  Surgeon: Ihor GullyMark Ottelin, MD;  Location: [facility];  Service: Urology;  Laterality: Right;    FAMILY HISTORY: Family History  Problem Relation Age of Onset  . Migraines Mother   . CAD Father   . Migraines Daughter     SOCIAL HISTORY: Social History   Social History  . Marital Status: Divorced    Spouse Name: N/A  . Number of Children: 2  . Years of Education: 12   Occupational History  .      Not working   Social History Main Topics  . Smoking status: Current Every Day Smoker -- 0.25 packs/day    Types:  Cigarettes  . Smokeless tobacco: Never Used  . Alcohol Use: No  . Drug Use: No  . Sexual Activity: Not on file   Other Topics Concern  . Not on file   Social History Narrative   Patient lives at home with her two children and her mother.   Unemployed. - Patient trying to get social security.   Education high school.   Right handed.   Caffeine soda's four soda's daily.     PHYSICAL EXAM  Filed Vitals:   08/20/15 1515  BP: 117/83  Pulse: 90  Height: 5\' 4"  (1.626 m)  Weight: 160 lb 8 oz (72.802 kg)   Body mass index is 27.54 kg/(m^2).   PHYSICAL EXAMNIATION:  Gen: NAD, conversant, well nourised, obese, well groomed                     Cardiovascular: Regular rate rhythm, no peripheral edema, warm, nontender. Eyes: Conjunctivae clear without exudates or hemorrhage  Neck: Supple, no carotid bruise. Pulmonary: Clear to auscultation bilaterally   NEUROLOGICAL EXAM:  MENTAL STATUS: Speech:    Speech is normal; fluent and spontaneous with normal comprehension.  Cognition:     Orientation to time, place and person     Normal recent and remote memory     Normal Attention span and concentration     Normal Language, naming, repeating,spontaneous speech     Fund of knowledge   CRANIAL NERVES: CN II: Visual fields are full to confrontation. Fundoscopic exam is normal with sharp discs and no vascular changes. Pupils are round equal and briskly reactive to light. CN III, IV, VI: extraocular movement are normal. No ptosis. CN V: Facial sensation is intact to pinprick in all 3 divisions bilaterally. Corneal responses are intact.  CN VII: Face is symmetric with normal eye closure and smile. CN VIII: Hearing is normal to rubbing fingers CN IX, X: Palate elevates symmetrically. Phonation is normal. CN XI: Head turning and shoulder shrug are intact CN XII: Tongue is midline with normal movements and no atrophy.  MOTOR: There is no pronator drift of out-stretched arms. Muscle bulk  and tone are normal. Muscle strength is normal.  REFLEXES: Reflexes are 2+ and symmetric at the biceps, triceps, knees, and ankles. Plantar responses are flexor.  SENSORY: Intact to light touch, pinprick, positional and vibratory sensation are intact in fingers and toes.  COORDINATION: Rapid alternating movements and fine finger movements are intact. There is no dysmetria on finger-to-nose and heel-knee-shin.    GAIT/STANCE: Posture is normal. Gait is steady with normal steps, base, arm swing, and turning. Heel and toe walking are normal. Tandem gait is normal.  Romberg is absent.    DIAGNOSTIC DATA (LABS, IMAGING, TESTING) - I reviewed patient records, labs, notes, testing and imaging myself where available.  Lab Results  Component Value Date   WBC 10.1 07/17/2015   HGB 14.0 07/17/2015   HCT 42.9 07/17/2015   MCV 89.2 07/17/2015   PLT 274 07/17/2015      Component Value Date/Time   NA 139 07/17/2015 1934   K 3.1* 07/17/2015 1934   CL 108 07/17/2015 1934   CO2 21* 07/17/2015 1934   GLUCOSE 117* 07/17/2015 1934   BUN 7 07/17/2015 1934   CREATININE 0.74 07/17/2015 1934   CALCIUM 9.3 07/17/2015 1934   PROT 7.8 07/17/2015 1934   ALBUMIN 4.2 07/17/2015 1934   AST 19 07/17/2015 1934   ALT 19 07/17/2015 1934   ALKPHOS 99 07/17/2015 1934   BILITOT 0.5 07/17/2015 1934   GFRNONAA >60 07/17/2015 1934   GFRAA >60 07/17/2015 1934    ASSESSMENT AND PLAN  40 y.o. year old female   Chronic migraine headache  She has tried and failed multiple preventative medications in the past, this includes Topamax, Depakote, Inderal, nortriptyline, Flexeril,  Frova as needed for abortive treatment  Botox injection as migraine prevention    Levert FeinsteinYijun Shams Fill, M.D. Ph.D.  [facility] [address] [place], [place] [address] Phone: [phone] Fax:      [phone]

## 2015-08-20 NOTE — Telephone Encounter (Signed)
Called patient back and talked with her regarding her apt today. This apt is just a consultation. No approvals have been submitted yet. The patient wanted to cancel apt until "botox was approved" I informed her that we cannot submit for approvals until she comes in for the consultation. She agreed to come in today.  °

## 2015-08-20 NOTE — Telephone Encounter (Signed)
Called patient back and talked with her regarding her apt today. This apt is just a consultation. No approvals have been submitted yet. The patient wanted to cancel apt until "botox was approved" I informed her that we cannot submit for approvals until she comes in for the consultation. She agreed to come in today.

## 2015-08-21 DIAGNOSIS — G43901 Migraine, unspecified, not intractable, with status migrainosus: Secondary | ICD-10-CM | POA: Insufficient documentation

## 2015-08-21 NOTE — Progress Notes (Signed)
   History:   Patient presented with more than 3 days history of severe prolonged headaches, failed multiple home medication treatment  Bupivicaine injection protocol for headache  Bupivacaine 0.5% was injected on the scalp bilaterally at several locations:  -On the occipital area of the head, 3 injections each side, 1 cc per injection at the midpoint between the mastoid process and the occipital protuberance. 2 other injections were done one finger breadth from the initial injection, one at a 10 o'clock position and the other at a 2 o'clock position.  -2 injections of 0.5 cc were done in the temporal regions, 2 fingerbreadths above the tragus of the ear, with the second injection one fingerbreadth posteriorly to the first.  -2 injections were done on the brow, 1 in the medial brow and one over the supraorbital nerve notch, with 0.1 cc for each injection  -1 injection each side of 0.5 cc was done anterior to the tragus of the ear for a trigeminal ganglion injection  The patient tolerated the injections well, no complications of the procedure were noted. Injections were made with a 27-gauge needle.  NDC number is [phone] [id number].

## 2015-09-03 ENCOUNTER — Telehealth: Payer: Self-pay | Admitting: Neurology

## 2015-09-03 NOTE — Telephone Encounter (Signed)
Pt called st[facility] frovatriptan (FROVA) 2.5 MG tablet needs PA

## 2015-09-03 NOTE — Telephone Encounter (Signed)
PA sent to Medicaid.  Called patient - left her a message to check with her pharmacy.  There should not be a problem getting this medication approved.

## 2015-09-07 ENCOUNTER — Encounter: Payer: Self-pay | Admitting: *Deleted

## 2015-09-07 ENCOUNTER — Telehealth: Payer: Self-pay | Admitting: *Deleted

## 2015-09-07 NOTE — Telephone Encounter (Signed)
Frova [facility] [phone]) through 09/01/16 - pt LK#[id number] K - plan cover #12/30 days.

## 2015-10-07 ENCOUNTER — Telehealth: Payer: Self-pay | Admitting: Neurology

## 2015-10-07 NOTE — Telephone Encounter (Signed)
I have attempted calling the patient a few times with no success. I left her a VM stating that I needed to speak with her. The speciality pharmacy has also been trying to reach her in order to get new patient enrollment and consent to ship us medication. The patients phone does not ring, it just goes directly to VM. If she calls back please have her call [phone] which is the direct number to the pharmacy.

## 2015-10-08 ENCOUNTER — Ambulatory Visit: Payer: Medicaid Other | Admitting: Neurology

## 2015-10-08 NOTE — Telephone Encounter (Signed)
Attempted to reach again today.  Left a message to return our call.  We need to reschedule her Botox appt.  Specialty pharmacy unable to obtain consent to ship her medication.

## 2015-10-21 ENCOUNTER — Telehealth: Payer: Self-pay | Admitting: Neurology

## 2015-10-21 NOTE — Telephone Encounter (Signed)
[facility] [phone] called regarding BOTOX, unable to verify patient's insurance, per information received, insurance has term'd, patient is not calling them back about current insurance, will put account on hold until they hear back from patient.

## 2016-01-25 ENCOUNTER — Emergency Department (HOSPITAL_COMMUNITY): Payer: Medicaid Other

## 2016-01-25 ENCOUNTER — Encounter (HOSPITAL_COMMUNITY): Payer: Self-pay | Admitting: *Deleted

## 2016-01-25 ENCOUNTER — Emergency Department (HOSPITAL_COMMUNITY)
Admission: EM | Admit: 2016-01-25 | Discharge: 2016-01-25 | Disposition: A | Discharge status: Home / Self Care | Payer: Medicaid Other | Attending: Emergency Medicine | Admitting: Emergency Medicine

## 2016-01-25 DIAGNOSIS — Z91040 Latex allergy status: Secondary | ICD-10-CM | POA: Insufficient documentation

## 2016-01-25 DIAGNOSIS — Z79899 Other long term (current) drug therapy: Secondary | ICD-10-CM | POA: Insufficient documentation

## 2016-01-25 DIAGNOSIS — R51 Headache: Secondary | ICD-10-CM | POA: Insufficient documentation

## 2016-01-25 DIAGNOSIS — F17210 Nicotine dependence, cigarettes, uncomplicated: Secondary | ICD-10-CM | POA: Insufficient documentation

## 2016-01-25 DIAGNOSIS — R519 Headache, unspecified: Secondary | ICD-10-CM

## 2016-01-25 LAB — I-STAT BETA HCG BLOOD, ED (MC, WL, AP ONLY)

## 2016-01-25 MED ORDER — SODIUM CHLORIDE 0.9 % IV SOLN
INTRAVENOUS | Status: DC
  Administered 2016-01-25: 18:00:00 via INTRAVENOUS

## 2016-01-25 MED ORDER — PROCHLORPERAZINE EDISYLATE 5 MG/ML IJ SOLN
10.0000 mg | Freq: Once | INTRAMUSCULAR | Status: AC
  Administered 2016-01-25: 10 mg via INTRAVENOUS
  Filled 2016-01-25: qty 2

## 2016-01-25 MED ORDER — DIPHENHYDRAMINE HCL 50 MG/ML IJ SOLN
12.5000 mg | Freq: Once | INTRAMUSCULAR | Status: AC
  Administered 2016-01-25: 12.5 mg via INTRAVENOUS
  Filled 2016-01-25: qty 1

## 2016-01-25 NOTE — ED Notes (Signed)
Lab called will hold lavender top

## 2016-01-25 NOTE — ED Notes (Signed)
Patient updated on wait time 

## 2016-01-25 NOTE — ED Notes (Signed)
Pt asked about an update on status, informed pt. And she went back to watching her phone.

## 2016-01-25 NOTE — ED Notes (Signed)
Updated on wait time. No change in symptoms. Pt denies offer of blanket.

## 2016-01-25 NOTE — Discharge Instructions (Signed)
Follow up with your primary care doctor regarding your headache, consider seeing an neurologist for further evaluation

## 2016-01-25 NOTE — ED Provider Notes (Signed)
[facility] Provider Note   CSN: [id number] Arrival date & time: 01/25/16  1201   [id number]  History   Chief Complaint Chief Complaint  Patient presents with  . Headache    HPI Forrestine HimHolly Sievers is a 40 y.o. female.  HPI Pt has history of a Chiari malformation.  She last had any imaging over  2 years ago.  Pt has been having trouble with a headache this morning on the left side of her head.  The pain is sharp.  She denies any nausea or vomiting.  No numbness or weakness.  She is concerned that the chiari malformation may be getting worse.  It has not been checked for the last couple of years.  She called neurology and was told to come to the ED.  She has pain with movement.  Her vision seems to be affected.  Past Medical History:  Diagnosis Date  . Allergic rhinitis   . Anxiety   . Chiari malformation   . DDD (degenerative disc disease), cervical   . Degenerative disc disease, lumbar   . Depression   . Fibromyalgia   . Hyperlipemia   . Migraine   . Scoliosis   . WPW (Wolff-Parkinson-White syndrome)     Patient Active Problem List   Diagnosis Date Noted  . Status migrainosus 08/21/2015  . Intractable pain 04/26/2015  . Hydronephrosis determined by ultrasound 04/26/2015  . Right flank pain   . Ureteral calculus, right 04/25/2015    Class: Acute  . Kidney stone 04/20/2015  . Obstruction of right ureteropelvic junction due to stone 04/20/2015  . UTI (lower urinary tract infection) 04/20/2015  . Fibromyalgia 04/20/2015  . Chronic pain disorder 04/20/2015  . Depression   . Anxiety   . Intractable chronic migraine without aura and without status migrainosus 03/26/2014  . Migraine 12/24/2013  . Headache 09/12/2012  . 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 Tobacco abuse 09/12/2012    Past Surgical History:  Procedure Laterality Date  . CESAREAN SECTION    . CYSTOSCOPY W/ URETERAL STENT PLACEMENT Right 04/25/2015   Procedure: CYSTOSCOPY WITH RETROGRADE PYELOGRAM/URETERAL STENT  PLACEMENT;  Surgeon: Ihor GullyMark Ottelin, MD;  Location: [facility];  Service: Urology;  Laterality: Right;  . ears Bilateral   . EYE SURGERY Bilateral   . herneated bowel repair    . HERNIA REPAIR    . HOLMIUM LASER APPLICATION Right 04/25/2015   Procedure: HOLMIUM LASER APPLICATION;  Surgeon: Ihor GullyMark Ottelin, MD;  Location: [facility];  Service: Urology;  Laterality: Right;  . KNEE SURGERY Bilateral    Right Knee x 1 - Left x3  . TUBAL LIGATION    . vein removed Left    arm    OB History    No data available       Home Medications    Prior to Admission medications   Medication Sig Start Date End Date Taking? Authorizing Provider  baclofen (LIORESAL) 10 MG tablet Take 10 mg by mouth 2 (two) times daily.    Historical Provider, MD  busPIRone (BUSPAR) 15 MG tablet Take 15 mg by mouth 2 (two) times daily.    Historical Provider, MD  clonazePAM (KLONOPIN) 1 MG tablet Take 0.5-1 mg by mouth 2 (two) times daily. Reported on 08/04/2015    Historical Provider, MD  diphenoxylate-atropine (LOMOTIL) 2.5-0.025 MG tablet Take 2 tablets by mouth 4 (four) times daily as needed for diarrhea or loose stools. 07/18/15   Gilda Creasehristopher J Pollina, MD  frovatriptan (FROVA) 2.5 MG tablet Take 1  tablet (2.5 mg total) by mouth as needed for migraine. If recurs, may repeat after 2 hours. Max of 3 tabs in 24 hours. 08/20/15   Levert Feinstein, MD  hydrOXYzine (VISTARIL) 25 MG capsule Take 25 mg by mouth at bedtime as needed (for sleep). Reported on 08/04/2015 04/09/15   Historical Provider, MD  levonorgestrel (MIRENA) 20 MCG/24HR IUD 1 each by Intrauterine route once.    Historical Provider, MD  NUCYNTA ER 150 MG TB12 Take 1 tablet by mouth daily. 08/12/15   Historical Provider, MD  ondansetron (ZOFRAN ODT) 4 MG disintegrating tablet Take 1 tablet (4 mg total) by mouth every 8 (eight) hours as needed for nausea or vomiting. 08/20/15   Levert Feinstein, MD    Family History Family History  Problem Relation Age of Onset  . Migraines Mother   . CAD  Father   . Migraines Daughter     Social History Social History  Substance Use Topics  . Smoking status: Current Every Day Smoker    Packs/day: 0.25    Types: Cigarettes  . Smokeless tobacco: Never Used  . Alcohol use No     Allergies   Digoxin and related; Toradol [ketorolac tromethamine]; Latex; Other; and Tape   Review of Systems Review of Systems  All other systems reviewed and are negative.    Physical Exam Updated Vital Signs BP 116/71   Pulse (!) 53   Temp 98 F (36.7 C) (Oral)   Resp 19   Ht 5\' 4"  (1.626 m)   Wt 78 kg   SpO2 98%   BMI 29.52 kg/m   Physical Exam  Constitutional: She appears well-developed and well-nourished. No distress.  HENT:  Head: Normocephalic and atraumatic.  Right Ear: External ear normal.  Left Ear: External ear normal.  Eyes: Conjunctivae and EOM are normal. Pupils are equal, round, and reactive to light. Right eye exhibits no discharge. Left eye exhibits no discharge. No scleral icterus.  Neck: Neck supple. No tracheal deviation present.  ttp left paraspinal region, no meningismus  Cardiovascular: Normal rate, regular rhythm and intact distal pulses.   Pulmonary/Chest: Effort normal and breath sounds normal. No stridor. No respiratory distress. She has no wheezes. She has no rales.  Abdominal: Soft. Bowel sounds are normal. She exhibits no distension. There is no tenderness. There is no rebound and no guarding.  Musculoskeletal: She exhibits no edema or tenderness.  Neurological: She is alert. She has normal strength. No cranial nerve deficit (no facial droop, extraocular movements intact, no slurred speech) or sensory deficit. She exhibits normal muscle tone. She displays no seizure activity. Coordination normal.  Skin: Skin is warm and dry. No rash noted.  Psychiatric: She has a normal mood and affect.  Nursing note and vitals reviewed.    ED Treatments / Results  Labs (all labs ordered are listed, but only abnormal  results are displayed) Labs Reviewed  I-STAT BETA HCG BLOOD, ED ([facility], [facility], [facility] ONLY)    Radiology Ct Head Wo Contrast  Result Date: 01/25/2016 CLINICAL DATA:  Headache, history of Chiari malformation EXAM: CT HEAD WITHOUT CONTRAST TECHNIQUE: Contiguous axial images were obtained from the base of the skull through the vertex without intravenous contrast. COMPARISON:  02/23/2015 FINDINGS: Brain: No intracranial hemorrhage, mass effect or midline shift. No acute cortical infarction. No mass lesion is noted on this unenhanced scan. Ventricular size is stable from prior exam. Vascular: No hyperdense vessel or unexpected calcification. Skull: Normal. Negative for fracture or focal lesion. Sinuses/Orbits: No acute  finding. Other: None. IMPRESSION: No acute intracranial abnormality. No significant change. No hydrocephalus. Electronically Signed   By: Natasha MeadLiviu  Pop M.D.   On: 01/25/2016 16:33    Procedures Procedures (including critical care time)  Medications Ordered in ED Medications  0.9 %  sodium chloride infusion ( Intravenous New Bag/Given 01/25/16 1740)  prochlorperazine (COMPAZINE) injection 10 mg (10 mg Intravenous Given 01/25/16 1740)  diphenhydrAMINE (BENADRYL) injection 12.5 mg (12.5 mg Intravenous Given 01/25/16 1740)     Initial Impression / Assessment and Plan / ED Course  I have reviewed the triage vital signs and the nursing notes.  Pertinent labs & imaging results that were available during my care of the patient were reviewed by me and considered in my medical decision making (see chart for details).  Clinical Course     No acute findings noted on CT.  No focal neuro abnormalities.  Eye exam appears normal.    Doubt stroke, TIA, hemorrhage , iritis or other emergenct conditions.  CT scan does not show any acute abnormalities associated with her chiari malformation.  ? Migraine type headache. Stable for outpatient follow up with PCP and neurology  Final Clinical Impressions(s) / ED  Diagnoses   Final diagnoses:  Headache disorder    New Prescriptions New Prescriptions   No medications on file     Linwood DibblesJon Tayron Hunnell, MD 01/25/16 1754

## 2016-01-25 NOTE — ED Notes (Signed)
2 IV attempts unsuccessful.  IV team consulted

## 2016-01-25 NOTE — ED Notes (Signed)
Patient re-assessed and updated on wait time and triage process. Patient states she has concern for increased pressure in head. RN explained vital signs changes related to increased ICP. Patient stated "well I also have SVT and you guys aren't taking me back" Patient updated on heart rate. Rn apologetic for wait time. Patient requesting to speak to charge. [place] Charity fundraiserN informed.

## 2016-01-25 NOTE — ED Notes (Signed)
Pt asked about status as long as wait goes, stated she "came in by ems and doesn't deserve to sit out here." pt made aware that we do have a wait, and she continued to inform me of other complaints she had.

## 2016-01-25 NOTE — ED Triage Notes (Signed)
Pt here via [facility] for pain from her L shoulder blade to her L forehead.  States hx of headaches d/t chiari malformation but this is not the same.  Also c/o blurred vision to L eye.  Denies nausea. AO x 4.

## 2016-04-30 ENCOUNTER — Emergency Department (HOSPITAL_COMMUNITY): Payer: Medicaid Other

## 2016-04-30 ENCOUNTER — Emergency Department (HOSPITAL_COMMUNITY)
Admission: EM | Admit: 2016-04-30 | Discharge: 2016-04-30 | Disposition: A | Discharge status: Home / Self Care | Payer: Medicaid Other | Attending: Emergency Medicine | Admitting: Emergency Medicine

## 2016-04-30 ENCOUNTER — Encounter (HOSPITAL_COMMUNITY): Payer: Self-pay | Admitting: Emergency Medicine

## 2016-04-30 DIAGNOSIS — F17210 Nicotine dependence, cigarettes, uncomplicated: Secondary | ICD-10-CM | POA: Insufficient documentation

## 2016-04-30 DIAGNOSIS — Y939 Activity, unspecified: Secondary | ICD-10-CM | POA: Insufficient documentation

## 2016-04-30 DIAGNOSIS — Y929 Unspecified place or not applicable: Secondary | ICD-10-CM | POA: Insufficient documentation

## 2016-04-30 DIAGNOSIS — M25562 Pain in left knee: Secondary | ICD-10-CM

## 2016-04-30 DIAGNOSIS — X501XXA Overexertion from prolonged static or awkward postures, initial encounter: Secondary | ICD-10-CM | POA: Insufficient documentation

## 2016-04-30 DIAGNOSIS — Y999 Unspecified external cause status: Secondary | ICD-10-CM | POA: Diagnosis not present

## 2016-04-30 DIAGNOSIS — Z91040 Latex allergy status: Secondary | ICD-10-CM | POA: Diagnosis not present

## 2016-04-30 NOTE — Progress Notes (Signed)
Orthopedic Tech Progress Note Patient Details:  Forrestine HimHolly Mcconnon 11/17/1975 [id number]  Ortho Devices Type of Ortho Device: Knee Immobilizer Ortho Device/Splint Interventions: Application   Saul FordyceJennifer C Miquel Lamson 04/30/2016, 5:20 PM

## 2016-04-30 NOTE — Discharge Instructions (Signed)
Patient is advised to use knee immobilizer and crutches until symptoms improve. Follow up with orthopedics as soon as possible for further evaluation. Return to ED for severe symptoms such as acute injury, loss of sensation or weakness.

## 2016-04-30 NOTE — ED Provider Notes (Signed)
[facility] Provider Note   CSN: [id number] Arrival date & time: 04/30/16  1525     History   Chief Complaint Chief Complaint  Patient presents with  . Knee Injury    HPI Brooke [name] is a 41 y.o. female.  Patient, who has pmh of 3 prior L knee surgeries, presents with 5 hour history of L knee pain that began after hearing a "pop" while putting on her pants. She rates it as 8/10 and is associated with a shooting pain radiating up her leg. She states she has been feeling pain in her kneecap for the past few days. This pain feels similar to tears she has previously had. She is currently in a pain management program and states that the Percocet she took before the incident did not help alleviate the pain. Worse with any type of movement. Denies injury to the area or recent trauma. Denies any other symptoms including numbness, chest pain, recent prolonged travel, bruising or loss of sensation. Patient's previous orthopedic doctor was located in [place].      Past Medical History:  Diagnosis Date  . Allergic rhinitis   . Anxiety   . Chiari malformation   . DDD (degenerative disc disease), cervical   . Degenerative disc disease, lumbar   . Depression   . Fibromyalgia   . Hyperlipemia   . Migraine   . Scoliosis   . WPW (Wolff-Parkinson-White syndrome)     Patient Active Problem List   Diagnosis Date Noted  . Status migrainosus 08/21/2015  . Intractable pain 04/26/2015  . Hydronephrosis determined by ultrasound 04/26/2015  . Right flank pain   . Ureteral calculus, right 04/25/2015    Class: Acute  . Kidney stone 04/20/2015  . Obstruction of right ureteropelvic junction due to stone 04/20/2015  . UTI (lower urinary tract infection) 04/20/2015  . Fibromyalgia 04/20/2015  . Chronic pain disorder 04/20/2015  . Depression   . Anxiety   . Intractable chronic migraine without aura and without status migrainosus 03/26/2014  . Migraine 12/24/2013  .  Headache 09/12/2012  . 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 Tobacco abuse 09/12/2012    Past Surgical History:  Procedure Laterality Date  . CESAREAN SECTION    . CYSTOSCOPY W/ URETERAL STENT PLACEMENT Right 04/25/2015   Procedure: CYSTOSCOPY WITH RETROGRADE PYELOGRAM/URETERAL STENT PLACEMENT;  Surgeon: Ihor Gully, MD;  Location: [facility];  Service: Urology;  Laterality: Right;  . ears Bilateral   . EYE SURGERY Bilateral   . herneated bowel repair    . HERNIA REPAIR    . HOLMIUM LASER APPLICATION Right 04/25/2015   Procedure: HOLMIUM LASER APPLICATION;  Surgeon: Ihor Gully, MD;  Location: [facility];  Service: Urology;  Laterality: Right;  . KNEE SURGERY Bilateral    Right Knee x 1 - Left x3  . TUBAL LIGATION    . vein removed Left    arm    OB History    No data available       Home Medications    Prior to Admission medications   Medication Sig Start Date End Date Taking? Authorizing Provider  baclofen (LIORESAL) 10 MG tablet Take 10 mg by mouth 2 (two) times daily.    Historical Provider, MD  busPIRone (BUSPAR) 15 MG tablet Take 15 mg by mouth 2 (two) times daily.    Historical Provider, MD  clonazePAM (KLONOPIN) 1 MG tablet Take 0.5-1 mg by mouth 2 (two) times daily. Reported on 08/04/2015    Historical Provider, MD  diphenoxylate-atropine (LOMOTIL) 2.5-0.025 MG tablet Take 2 tablets by mouth 4 (four) times daily as needed for diarrhea or loose stools. 07/18/15   Gilda Crease, MD  frovatriptan (FROVA) 2.5 MG tablet Take 1 tablet (2.5 mg total) by mouth as needed for migraine. If recurs, may repeat after 2 hours. Max of 3 tabs in 24 hours. 08/20/15   Levert Feinstein, MD  hydrOXYzine (VISTARIL) 25 MG capsule Take 25 mg by mouth at bedtime as needed (for sleep). Reported on 08/04/2015 04/09/15   Historical Provider, MD  levonorgestrel (MIRENA) 20 MCG/24HR IUD 1 each by Intrauterine route once.    Historical Provider, MD  NUCYNTA ER 150 MG TB12 Take 1 tablet by mouth daily. 08/12/15   Historical  Provider, MD  ondansetron (ZOFRAN ODT) 4 MG disintegrating tablet Take 1 tablet (4 mg total) by mouth every 8 (eight) hours as needed for nausea or vomiting. 08/20/15   Levert Feinstein, MD    Family History Family History  Problem Relation Age of Onset  . Migraines Mother   . CAD Father   . Migraines Daughter     Social History Social History  Substance Use Topics  . Smoking status: Current Every Day Smoker    Packs/day: 0.25    Types: Cigarettes  . Smokeless tobacco: Never Used  . Alcohol use No     Allergies   Digoxin and related; Toradol [ketorolac tromethamine]; Latex; Other; and Tape   Review of Systems Review of Systems  Constitutional: Positive for activity change. Negative for fatigue and fever.  HENT: Negative for rhinorrhea, sinus pressure, sneezing and sore throat.   Eyes: Negative for visual disturbance.  Respiratory: Negative for cough, chest tightness and shortness of breath.   Cardiovascular: Negative for chest pain and leg swelling.  Gastrointestinal: Negative for nausea.  Genitourinary: Negative for dysuria.  Musculoskeletal: Positive for arthralgias (left knee pain worse with movement). Negative for back pain, joint swelling and myalgias.  Skin: Negative for color change, pallor, rash and wound.  Neurological: Negative for dizziness and light-headedness.     Physical Exam Updated Vital Signs BP 130/78 (BP Location: Left Arm)   Pulse 82   Temp 98.2 F (36.8 C) (Oral)   Resp 16   SpO2 96%   Physical Exam  Constitutional: She appears well-developed and well-nourished. No distress.  HENT:  Head: Normocephalic and atraumatic.  Nose: Nose normal.  Eyes: Conjunctivae and EOM are normal. Left eye exhibits no discharge. No scleral icterus.  Neck: Normal range of motion. Neck supple.  Cardiovascular: Normal rate, regular rhythm, normal heart sounds and intact distal pulses.  Exam reveals no gallop and no friction rub.   No murmur heard. Pulmonary/Chest:  Effort normal and breath sounds normal. No respiratory distress.  Abdominal: Soft. Bowel sounds are normal. She exhibits no distension. There is no tenderness. There is no guarding.  Musculoskeletal: Normal range of motion. She exhibits tenderness (tenderness all around L kneecap area). She exhibits no edema or deformity.  Multiple healed scars from prior surgeries seen. No visible injury or trauma to the area. No erythema or warmth. Limited ROM due to pain but patient is extend knee off of bed. Pain with bending of the knee.  Neurological: She is alert. She exhibits normal muscle tone. Coordination normal.  Skin: Skin is warm and dry. No rash noted. No erythema. No pallor.  Psychiatric: She has a normal mood and affect.  Nursing note and vitals reviewed.    ED Treatments / Results  Labs (all  labs ordered are listed, but only abnormal results are displayed) Labs Reviewed - No data to display  EKG  EKG Interpretation None       Radiology Dg Knee Complete 4 Views Left  Result Date: 04/30/2016 CLINICAL DATA:  Left knee pain.  Heard a pop in the knee. EXAM: LEFT KNEE - COMPLETE 4+ VIEW COMPARISON:  None. FINDINGS: No acute fracture or dislocation. Two fully threaded screws transfixing the tibial tuberosity without failure or complication. Joint spaces are relatively well maintained. Indistinctness of the quadriceps tendon at its patellar insertion concerning for possible quadriceps tendon tear. IMPRESSION: No acute osseous injury of the left knee. Indistinctness of the quadriceps tendon at its patellar insertion concerning for possible quadriceps tendon tear. Electronically Signed   By: Elige KoHetal  Patel   On: 04/30/2016 16:21    Procedures Procedures (including critical care time)  Medications Ordered in ED Medications - No data to display   Initial Impression / Assessment and Plan / ED Course  I have reviewed the triage vital signs and the nursing notes.  Pertinent labs & imaging  results that were available during my care of the patient were reviewed by me and considered in my medical decision making (see chart for details).     Due to patient's prior medical history of multiple surgeries on L knee, an x-ray was warranted which showed a possible tear of the quadriceps tendon. Because she was able to extend knee while sitting, there is a stronger likelihood of incomplete rather than complete tear. Patient will be discharged with knee immobilizer and crutches and referred to orthopedics for further evaluation. She refuses any pain medication at this time. Patient was updated on findings and voiced understanding.   Final Clinical Impressions(s) / ED Diagnoses   Final diagnoses:  Acute pain of left knee    New Prescriptions New Prescriptions   No medications on file     Dietrich PatesHina Lailyn Appelbaum, [place] 04/30/16 1712    Laurence Spatesachel Morgan Little, MD 05/01/16 1616

## 2016-04-30 NOTE — ED Notes (Signed)
Ortho Paged.  

## 2016-04-30 NOTE — ED Notes (Signed)
ED Provider at bedside. 

## 2016-04-30 NOTE — ED Triage Notes (Signed)
Pt states she was putting on her pants and felt a "rip and pop" in left knee. Pt reports having 3 surgeries on knee. Pt is able to walk but has to "hobble" and states it is very painful.

## 2016-05-06 ENCOUNTER — Ambulatory Visit (INDEPENDENT_AMBULATORY_CARE_PROVIDER_SITE_OTHER): Payer: Medicaid Other | Admitting: Family

## 2016-05-06 ENCOUNTER — Encounter (INDEPENDENT_AMBULATORY_CARE_PROVIDER_SITE_OTHER): Payer: Self-pay | Admitting: Family

## 2016-05-06 VITALS — Ht 64.0 in | Wt 172.0 lb

## 2016-05-06 DIAGNOSIS — S76192A Other specified injury of left quadriceps muscle, fascia and tendon, initial encounter: Secondary | ICD-10-CM | POA: Diagnosis not present

## 2016-05-06 NOTE — Progress Notes (Signed)
Office Visit Note   Patient: Brooke [name]           Date of Birth: 06-15-1975           MRN: [id number] Visit Date: 05/06/2016              Requested by: Jackie Plum, MD [address] [place], [place] [address] PCP: Jackie Plum, MD  Chief Complaint  Patient presents with  . Left Knee - Pain    Follow up injury and ER visit 04/30/16    HPI: Patient was putting on a pair of pants and she felt and heard a pop in her knee. After several hours of intense pain she went to the ER for eval on 04/30/16. X rays were obtained. She states that she has had multiple surgeries on this knee in the past but does not remember the names. She is weight bearing with crutches and in an immobilizer. Patient complains of the knee feeling "loose" and that it felt unstable before the injury. She does complain of global pain, swelling and instability. Rodena Medin, RMA  The patient is a 41 year old woman who presents today for evaluation of left knee pain. She is seen in follow-up for her knee injury as well as an accompanying ER visit this was on March 10. Patient states she was up her pants and felt and heard a pop in her knee. This was followed by intense pain. Radiographs are obtained these were negative. Patient reports she has a history of multiple surgeries on this knee in the past cannot remember what any of those were for.   She has been full weightbearing carrying her crutches in a knee immobilizer. States something feels loose in her knee. However she does state that her knee felt unstable prior to the injury. She complains of global pain and some swelling instability with ambulation.  Assessment & Plan: Visit Diagnoses: No diagnosis found.  Plan: We will set her up for MRI to evaluate the extent of her quadriceps tendon tear. Emphasized the importance of nonweightbearing for healing. Patient states that she is a 38-year-old child and it is just too hard for her to stay off of  her knee. She will continue home her immobilizer as well as ice for pain. Has a prescription for narcotics from the emergency room.  Follow-Up Instructions: No Follow-up on file.  Physical Exam  Constitutional: Appears well-developed.  Head: Normocephalic.  Eyes: EOM are normal.  Neck: Normal range of motion.  Cardiovascular: Normal rate.   Pulmonary/Chest: Effort normal.  Neurological: Is alert.  Skin: Skin is warm.  Psychiatric: Has a normal mood and affect. Does have an antalgic gait. Left Knee Exam   Tenderness  The patient is experiencing tenderness in the patellar tendon (Proximal to patella, and quadriceps tendon.).  Other  Swelling: mild  Comments:  There is a palpable defect in the medial aspect of the  patellar edges that tendon. This is tender. The extensor mechanism is intact. Does have weakness with extension.       Imaging: No results found.  Labs: Lab Results  Component Value Date   REPTSTATUS 04/22/2015 FINAL 04/20/2015   GRAMSTAIN  06/14/2013    FEW WBC PRESENT, PREDOMINANTLY PMN NO SQUAMOUS EPITHELIAL CELLS SEEN RARE GRAM POSITIVE COCCI IN PAIRS Performed at [facility]   CULT MULTIPLE SPECIES PRESENT, SUGGEST RECOLLECTION 04/20/2015   LABORGA STAPHYLOCOCCUS AUREUS 06/14/2013    Orders:  No orders of the defined types were placed  in this encounter.  No orders of the defined types were placed in this encounter.    Procedures: No procedures performed  Clinical Data: No additional findings.  Subjective: Review of Systems  Constitutional: Negative for chills and fever.  Musculoskeletal: Positive for arthralgias, gait problem, joint swelling and myalgias.    Objective: Vital Signs: Ht 5\' 4"  (1.626 m)   Wt 172 lb (78 kg)   BMI 29.52 kg/m   Specialty Comments:  No specialty comments available.  PMFS History: Patient Active Problem List   Diagnosis Date Noted  . Status migrainosus 08/21/2015  . Intractable pain 04/26/2015    . Hydronephrosis determined by ultrasound 04/26/2015  . Right flank pain   . Ureteral calculus, right 04/25/2015    Class: Acute  . Kidney stone 04/20/2015  . Obstruction of right ureteropelvic junction due to stone 04/20/2015  . UTI (lower urinary tract infection) 04/20/2015  . Fibromyalgia 04/20/2015  . Chronic pain disorder 04/20/2015  . Depression   . Anxiety   . Intractable chronic migraine without aura and without status migrainosus 03/26/2014  . Migraine 12/24/2013  . Headache 09/12/2012  . 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 Tobacco abuse 09/12/2012   Past Medical History:  Diagnosis Date  . Allergic rhinitis   . Anxiety   . Chiari malformation   . DDD (degenerative disc disease), cervical   . Degenerative disc disease, lumbar   . Depression   . Fibromyalgia   . Hyperlipemia   . Migraine   . Scoliosis   . WPW (Wolff-Parkinson-White syndrome)     Family History  Problem Relation Age of Onset  . Migraines Mother   . CAD Father   . Migraines Daughter     Past Surgical History:  Procedure Laterality Date  . CESAREAN SECTION    . CYSTOSCOPY W/ URETERAL STENT PLACEMENT Right 04/25/2015   Procedure: CYSTOSCOPY WITH RETROGRADE PYELOGRAM/URETERAL STENT PLACEMENT;  Surgeon: Ihor GullyMark Ottelin, MD;  Location: [facility];  Service: Urology;  Laterality: Right;  . ears Bilateral   . EYE SURGERY Bilateral   . herneated bowel repair    . HERNIA REPAIR    . HOLMIUM LASER APPLICATION Right 04/25/2015   Procedure: HOLMIUM LASER APPLICATION;  Surgeon: Ihor GullyMark Ottelin, MD;  Location: [facility];  Service: Urology;  Laterality: Right;  . KNEE SURGERY Bilateral    Right Knee x 1 - Left x3  . TUBAL LIGATION    . vein removed Left    arm   Social History   Occupational History  .      Not working   Social History Main Topics  . Smoking status: Current Every Day Smoker    Packs/day: 0.25    Types: Cigarettes  . Smokeless tobacco: Never Used  . Alcohol use No  . Drug use: No  . Sexual activity:  Not on file

## 2016-05-09 ENCOUNTER — Telehealth (INDEPENDENT_AMBULATORY_CARE_PROVIDER_SITE_OTHER): Payer: Self-pay | Admitting: *Deleted

## 2016-05-09 NOTE — Telephone Encounter (Signed)
Pt has app[facility] for MRI on Mar 28 At 3pm, pt is to arrive 15 mins early to register, left message on vm fro pt to return my call fro appt information.

## 2016-05-11 NOTE — Telephone Encounter (Signed)
Pt aware of appt.

## 2016-05-18 ENCOUNTER — Ambulatory Visit (HOSPITAL_COMMUNITY)
Admission: RE | Admit: 2016-05-18 | Discharge: 2016-05-18 | Disposition: A | Discharge status: Home / Self Care | Payer: Medicaid Other | Source: Ambulatory Visit | Attending: Family | Admitting: Family

## 2016-05-18 DIAGNOSIS — X58XXXA Exposure to other specified factors, initial encounter: Secondary | ICD-10-CM | POA: Insufficient documentation

## 2016-05-18 DIAGNOSIS — S76192A Other specified injury of left quadriceps muscle, fascia and tendon, initial encounter: Secondary | ICD-10-CM | POA: Diagnosis present

## 2016-05-19 NOTE — Progress Notes (Signed)
Patient scheduled 05/26/16 for MRI review with Dr Lajoyce Cornersuda.

## 2016-05-19 NOTE — Progress Notes (Signed)
Will you sched f/u with duda for mri review

## 2016-05-26 ENCOUNTER — Ambulatory Visit (INDEPENDENT_AMBULATORY_CARE_PROVIDER_SITE_OTHER): Payer: Medicaid Other | Admitting: Orthopedic Surgery

## 2016-05-26 ENCOUNTER — Encounter (INDEPENDENT_AMBULATORY_CARE_PROVIDER_SITE_OTHER): Payer: Self-pay | Admitting: Orthopedic Surgery

## 2016-05-26 VITALS — Ht 64.0 in | Wt 172.0 lb

## 2016-05-26 DIAGNOSIS — M62559 Muscle wasting and atrophy, not elsewhere classified, unspecified thigh: Secondary | ICD-10-CM

## 2016-05-26 DIAGNOSIS — S83002S Unspecified subluxation of left patella, sequela: Secondary | ICD-10-CM | POA: Diagnosis not present

## 2016-05-26 NOTE — Progress Notes (Signed)
Office Visit Note   Patient: Brooke [name]           Date of Birth: 20-Jun-1975           MRN: [id number] Visit Date: 05/26/2016              Requested by: Jackie Plum, MD [address] [place], [place] [address] PCP: Jackie Plum, MD  Chief Complaint  Patient presents with  . Left Knee - Follow-up    MRI review      HPI: Patient is status post multiple surgical procedures for subluxation of both patellas. On her left knee she has an incision medially that extends through the VMO. Patient and acute injury and has persistent pain with patella instability. MRI obtained to evaluate her extensor mechanism.  Assessment & Plan: Visit Diagnoses:  1. Atrophy of quadriceps femoris muscle   2. Patellar subluxation, left, sequela     Plan: We'll have patient start with physical therapy for VMO strengthening. She is given instructions and demonstrated quad isometric straight leg raises to begin her rehabilitation protocol. She will wear her knee immobilizer until her strength returns discontinue her crutches.  Follow-Up Instructions: Return if symptoms worsen or fail to improve.   Ortho Exam  Patient is alert, oriented, no adenopathy, well-dressed, normal affect, normal respiratory effort. Examination patient has an antalgic gait. Examination she has significant quad A Toney and atrophy of the left leg compared to the right. The surgical incision goes across the VMO tendon. The patella was midline there is no effusion she can do a single limb raise with no extensor lag. She is tender more to palpation on the patella tendon. Review of the MRI scan shows a sprain of the quad tendon is intact meniscus and cruciate and collateral ligaments was some thinning of the cartilage laterally. No signs or symptoms of complete rupture of the extensor mechanism.  Imaging: No results found.  Labs: Lab Results  Component Value Date   REPTSTATUS 04/22/2015 FINAL 04/20/2015   GRAMSTAIN  06/14/2013    FEW WBC PRESENT, PREDOMINANTLY PMN NO SQUAMOUS EPITHELIAL CELLS SEEN RARE GRAM POSITIVE COCCI IN PAIRS Performed at [facility]   CULT MULTIPLE SPECIES PRESENT, SUGGEST RECOLLECTION 04/20/2015   LABORGA STAPHYLOCOCCUS AUREUS 06/14/2013    Orders:  Orders Placed This Encounter  Procedures  . Ambulatory referral to Physical Therapy   No orders of the defined types were placed in this encounter.    Procedures: No procedures performed  Clinical Data: No additional findings.  ROS:  All other systems negative, except as noted in the HPI. Review of Systems  Objective: Vital Signs: Ht  (1.626 m)   Wt 172 lb (78 kg)   BMI 29.52 kg/m   Specialty Comments:  No specialty comments available.  PMFS History: Patient Active Problem List   Diagnosis Date Noted  . Atrophy of quadriceps femoris muscle 05/26/2016  . Patellar subluxation, left, sequela 05/26/2016  . Status migrainosus 08/21/2015  . Intractable pain 04/26/2015  . Hydronephrosis determined by ultrasound 04/26/2015  . Right flank pain   . Ureteral calculus, right 04/25/2015    Class: Acute  . Kidney stone 04/20/2015  . Obstruction of right ureteropelvic junction due to stone 04/20/2015  . UTI (lower urinary tract infection) 04/20/2015  . Fibromyalgia 04/20/2015  . Chronic pain disorder 04/20/2015  . Depression   . Anxiety   . Intractable chronic migraine without aura and without status migrainosus 03/26/2014  . Migraine 12/24/2013  .  Headache 09/12/2012  . 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 Tobacco abuse 09/12/2012   Past Medical History:  Diagnosis Date  . Allergic rhinitis   . Anxiety   . Chiari malformation   . DDD (degenerative disc disease), cervical   . Degenerative disc disease, lumbar   . Depression   . Fibromyalgia   . Hyperlipemia   . Migraine   . Scoliosis   . WPW (Wolff-Parkinson-White syndrome)     Family History  Problem Relation Age of Onset  .  Migraines Mother   . CAD Father   . Migraines Daughter     Past Surgical History:  Procedure Laterality Date  . CESAREAN SECTION    . CYSTOSCOPY W/ URETERAL STENT PLACEMENT Right 04/25/2015   Procedure: CYSTOSCOPY WITH RETROGRADE PYELOGRAM/URETERAL STENT PLACEMENT;  Surgeon: Ihor Gully, MD;  Location: [facility];  Service: Urology;  Laterality: Right;  . ears Bilateral   . EYE SURGERY Bilateral   . herneated bowel repair    . HERNIA REPAIR    . HOLMIUM LASER APPLICATION Right 04/25/2015   Procedure: HOLMIUM LASER APPLICATION;  Surgeon: Ihor Gully, MD;  Location: [facility];  Service: Urology;  Laterality: Right;  . KNEE SURGERY Bilateral    Right Knee x 1 - Left x3  . TUBAL LIGATION    . vein removed Left    arm   Social History   Occupational History  .      Not working   Social History Main Topics  . Smoking status: Current Every Day Smoker    Packs/day: 0.25    Types: Cigarettes  . Smokeless tobacco: Never Used  . Alcohol use No  . Drug use: No  . Sexual activity: Not on file

## 2016-05-30 ENCOUNTER — Ambulatory Visit (INDEPENDENT_AMBULATORY_CARE_PROVIDER_SITE_OTHER): Payer: Medicaid Other | Admitting: Neurology

## 2016-05-30 ENCOUNTER — Encounter: Payer: Self-pay | Admitting: Neurology

## 2016-05-30 VITALS — BP 118/85 | HR 92 | Ht 64.0 in | Wt 192.0 lb

## 2016-05-30 DIAGNOSIS — G43709 Chronic migraine without aura, not intractable, without status migrainosus: Secondary | ICD-10-CM | POA: Insufficient documentation

## 2016-05-30 DIAGNOSIS — IMO0002 Reserved for concepts with insufficient information to code with codable children: Secondary | ICD-10-CM | POA: Insufficient documentation

## 2016-05-30 DIAGNOSIS — R519 Headache, unspecified: Secondary | ICD-10-CM

## 2016-05-30 DIAGNOSIS — R51 Headache: Secondary | ICD-10-CM | POA: Diagnosis not present

## 2016-05-30 DIAGNOSIS — Z79899 Other long term (current) drug therapy: Secondary | ICD-10-CM | POA: Diagnosis not present

## 2016-05-30 MED ORDER — SUMATRIPTAN SUCCINATE 6 MG/0.5ML ~~LOC~~ SOLN: 6.0000 mg | SUBCUTANEOUS | 11 refills | Status: DC | PRN

## 2016-05-30 MED ORDER — ONDANSETRON 4 MG PO TBDP: 4.0000 mg | ORAL_TABLET | Freq: Three times a day (TID) | ORAL | 6 refills | Status: DC | PRN

## 2016-05-30 MED ORDER — NAPROXEN 500 MG PO TBEC: 500.0000 mg | DELAYED_RELEASE_TABLET | ORAL | 6 refills | Status: DC | PRN

## 2016-05-30 MED ORDER — BUPIVACAINE HCL (PF) 0.5 % IJ SOLN
10.0000 mL | Freq: Once | INTRAMUSCULAR | Status: AC
Start: 2016-05-30 — End: 2016-05-30
  Administered 2016-05-30: 10 mL

## 2016-05-30 MED ORDER — RIZATRIPTAN BENZOATE 10 MG PO TBDP: 10.0000 mg | ORAL_TABLET | ORAL | 6 refills | Status: DC | PRN

## 2016-05-30 NOTE — Progress Notes (Signed)
**  Bupivacaine /81ml x 1 vial, NDC 16109-604-54, Lot [id number], Exp 07/2017, office supply.//mck**

## 2016-05-30 NOTE — Progress Notes (Signed)
   History: 40 year-old female with history of chronic migraine headaches, depression anxiety, polypharmacy treatment, presented with frequent prolonged headaches 8 out of 10 bilateral frontal temporal region for one week, she has tried and failed over-the-counter naproxen, Lyrica, Maxalt treatment.  Bilateral occipital and trigeminal nerve block; trigger point injection of bilateral cervical and upper trapezius muscles for intractable headache  Bupivacaine 0.5% was injected on the scalp bilaterally at several locations:  -On the occipital area of the head, 3 injections each side, 0.5 cc per injection at the midpoint between the mastoid process and the occipital protuberance. 2 other injections were done one finger breadth from the initial injection, one at a 10 o'clock position and the other at a 2 o'clock position.  -2 injections of 0.5 cc were done in the temporal regions, 2 fingerbreadths above the tragus of the ear, with the second injection one fingerbreadth posteriorly to the first.  -2 injections were done on the brow, 1 in the medial brow and one over the supraorbital nerve notch, with 0.1 cc for each injection  -1 injection each side of 0.5 cc was done anterior to the tragus of the ear for a trigeminal ganglion injection  -0.5 cc was injected into bilateral upper trapezius and bilateral upper cervical paraspinals  The patient tolerated the injections well, no complications of the procedure were noted. Injections were made with a 27-gauge needle.

## 2016-05-30 NOTE — Progress Notes (Signed)
Chief Complaint  Patient presents with  . Migraine    She estimates having a headache nearly every day.  She never set up her Botox appt last year but would like to restart the treatment now.  She has failed multiple preventive and rescue medications in the past.  Botox has provided her with some relief in the past.       [facility]  PATIENT: Brooke [name] DOB: Feb 20, 1976   HISTORY OF PRESENT ILLNESS:  HISTORY Brooke Spradlinis a 41 year old right-handed female, referred by her primary care physician Dr. Julio Sicks for evaluation of diffuse body achy pain, migraine  She carries a diagnosis of chronic migraine since 1983,, fibromyalgia since 2011, she complains of chronic neck pain, low back pain, joints pain, radiating pain to her left arm,  She moved from [place] to [place] in November 2013, has been under the [facility] pain management. Over the years, she has tried different medications,   Previously she has tried and failed Topamax, trigger point injection, which actually made her headache worse while she was in [place], she reported frequent emergency room visit, 3 times each month, receiving Dilaudid, She was also treated with Opana, fentanyl patch, Demerol in the past. she is now taking Norco,from her pain management  She reported long-standing history of migraine, her typical migraine are right retro-orbital area severe pounding headache with associated light noise sensitivity, resting helps, lasting few hours,  She has tried Fioricet, Flexeril, Norco,without help, previously she has tried different triptans, including Imitrex, Zomig, Maxalt, without helping,  She has tried and failed preventive medications Topamax, Depakote, Inderal, nortriptyline, Flexeril,  Has tried different abortive treatment, including Imitrex injection, sometimes make her headache worse, Zomig, Maxalt, Imitrex tablets,   Trigger for her headaches are environmental allergy,  certain food, such as spicy food, acidic food,  She complains of few years history of bilateral feet parestheisa.increased after bearing weight , reported abnormal EMG nerve conduction study from outside facility in May 2015 consistent with peripheral neuropathy  We have reviewed MRI of cervical, and lumbar spine at [facility] in June 2015, there was evidence of multilevel degenerative disc disease, there was no significant canal, or foraminal stenosis  She is now currently taking Nucynta ER 150 mg every day for chronic migraine headache, she also daily Excedrin Migraine for her headaches, up to 4 tablets each day,   Migraine Frequency: daily since 2015, increased frequency and severity since Nucynta ER dosage was increased from 100 to  dose was increased in June 2017. She has severe headache was associated light, noise sensitivity, had to stay in dark quiet room, have her sunglasses even indoor.  Migraine Last from 5 hours to 12 hours.  UPDATE May 30 2016: We have discussed about Botox injection at her last visit in June 2017, but it never followed through, She reported more than 25 days of migraine in one month, this has been ongoing since last 10 years, She has been on chronic narcotics treatment, recently had major change of her pain medications, she is now taking percocet 10/325mg  4 tablets a day,since February 2018, she is still on long-acting narcotics Embeda 50/2 mg 1 capsule daily, he is also taking clonazepam 1 mg twice a day, BuSpar 15 mg twice a day, baclofen 10 mg twice a day, Lyrica 75 mg twice a day,  She lying in the dark trying to help her pain, she end up spending most of her time in bed, she lives with her family, she also  smokes, is applying for disability.  She complains 8 out of 10 bilateral frontal area throbbing headache with associated light noise sensitivity, and times with associated nausea, dizziness, lasting hours to up to one week.  REVIEW OF SYSTEMS: Full  14 system review of systems performed and notable only for those listed, all others are neg: Headaches   ALLERGIES: Allergies  Allergen Reactions  . Digoxin And Related Nausea And Vomiting    Elevated 'white count'  . Toradol [Ketorolac Tromethamine] Other (See Comments)    Uncontrollable 'shakes'  . Latex Hives and Swelling  . Other Other (See Comments)    Any nasal sprays cause serious migranes  . Tape Itching and Rash    Please use "paper" tape    HOME MEDICATIONS: Outpatient Medications Prior to Visit  Medication Sig Dispense Refill  . baclofen (LIORESAL) 10 MG tablet Take 10 mg by mouth 2 (two) times daily.    . busPIRone (BUSPAR) 15 MG tablet Take 15 mg by mouth 2 (two) times daily.    . clonazePAM (KLONOPIN) 1 MG tablet Take 0.5-1 mg by mouth 2 (two) times daily. Reported on 08/04/2015    . levonorgestrel (MIRENA) 20 MCG/24HR IUD 1 each by Intrauterine route once.    . diphenoxylate-atropine (LOMOTIL) 2.5-0.025 MG tablet Take 2 tablets by mouth 4 (four) times daily as needed for diarrhea or loose stools. 30 tablet 0  . frovatriptan (FROVA) 2.5 MG tablet Take 1 tablet (2.5 mg total) by mouth as needed for migraine. If recurs, may repeat after 2 hours. Max of 3 tabs in 24 hours. 15 tablet 3  . hydrOXYzine (VISTARIL) 25 MG capsule Take 25 mg by mouth at bedtime as needed (for sleep). Reported on 08/04/2015  0  . NUCYNTA ER 150 MG TB12 Take 1 tablet by mouth daily.  0  . ondansetron (ZOFRAN ODT) 4 MG disintegrating tablet Take 1 tablet (4 mg total) by mouth every 8 (eight) hours as needed for nausea or vomiting. 15 tablet 6   No facility-administered medications prior to visit.     PAST MEDICAL HISTORY: Past Medical History:  Diagnosis Date  . Allergic rhinitis   . Anxiety   . Chiari malformation   . DDD (degenerative disc disease), cervical   . Degenerative disc disease, lumbar   . Depression   . Fibromyalgia   . Hyperlipemia   . Migraine   . Scoliosis   . WPW  (Wolff-Parkinson-White syndrome)     PAST SURGICAL HISTORY: Past Surgical History:  Procedure Laterality Date  . CESAREAN SECTION    . CYSTOSCOPY W/ URETERAL STENT PLACEMENT Right 04/25/2015   Procedure: CYSTOSCOPY WITH RETROGRADE PYELOGRAM/URETERAL STENT PLACEMENT;  Surgeon: Ihor Gully, MD;  Location: [facility];  Service: Urology;  Laterality: Right;  . ears Bilateral   . EYE SURGERY Bilateral   . herneated bowel repair    . HERNIA REPAIR    . HOLMIUM LASER APPLICATION Right 04/25/2015   Procedure: HOLMIUM LASER APPLICATION;  Surgeon: Ihor Gully, MD;  Location: [facility];  Service: Urology;  Laterality: Right;  . KNEE SURGERY Bilateral    Right Knee x 1 - Left x3  . TUBAL LIGATION    . vein removed Left    arm    FAMILY HISTORY: Family History  Problem Relation Age of Onset  . Migraines Mother   . CAD Father   . Migraines Daughter     SOCIAL HISTORY: Social History   Social History  . Marital status: Divorced  Spouse name: N/A  . Number of children: 2  . Years of education: 12   Occupational History  .      Not working   Social History Main Topics  . Smoking status: Current Every Day Smoker    Packs/day: 0.25    Types: Cigarettes  . Smokeless tobacco: Never Used  . Alcohol use No  . Drug use: No  . Sexual activity: Not on file   Other Topics Concern  . Not on file   Social History Narrative   Patient lives at home with her two children and her mother.   Unemployed. - Patient trying to get social security.   Education high school.   Right handed.   Caffeine soda's four soda's daily.     PHYSICAL EXAM  Vitals:   05/30/16 1214  BP: 118/85  Pulse: 92  Weight: 192 lb (87.1 kg)  Height:  (1.626 m)   Body mass index is 32.96 kg/m.   PHYSICAL EXAMNIATION:  Gen: NAD, conversant, well nourised, obese, well groomed                     Cardiovascular: Regular rate rhythm, no peripheral edema, warm, nontender. Eyes: Conjunctivae clear without  exudates or hemorrhage Neck: Supple, no carotid bruise. Pulmonary: Clear to auscultation bilaterally   NEUROLOGICAL EXAM:  MENTAL STATUS: Speech:    Speech is normal; fluent and spontaneous with normal comprehension.  Cognition:     Orientation to time, place and person     Normal recent and remote memory     Normal Attention span and concentration     Normal Language, naming, repeating,spontaneous speech     Fund of knowledge   CRANIAL NERVES: CN II: Visual fields are full to confrontation. Fundoscopic exam is normal with sharp discs and no vascular changes. Pupils are round equal and briskly reactive to light. CN III, IV, VI: extraocular movement are normal. No ptosis. CN V: Facial sensation is intact to pinprick in all 3 divisions bilaterally. Corneal responses are intact.  CN VII: Face is symmetric with normal eye closure and smile. CN VIII: Hearing is normal to rubbing fingers CN IX, X: Palate elevates symmetrically. Phonation is normal. CN XI: Head turning and shoulder shrug are intact CN XII: Tongue is midline with normal movements and no atrophy.  MOTOR: There is no pronator drift of out-stretched arms. Muscle bulk and tone are normal. Muscle strength is normal.  REFLEXES: Reflexes are 2+ and symmetric at the biceps, triceps, knees, and ankles. Plantar responses are flexor.  SENSORY: Intact to light touch, pinprick, positional and vibratory sensation are intact in fingers and toes.  COORDINATION: Rapid alternating movements and fine finger movements are intact. There is no dysmetria on finger-to-nose and heel-knee-shin.    GAIT/STANCE: Posture is normal. Gait is steady with normal steps, base, arm swing, and turning. Heel and toe walking are normal. Tandem gait is normal.  Romberg is absent.    DIAGNOSTIC DATA (LABS, IMAGING, TESTING) - I reviewed patient records, labs, notes, testing and imaging myself where available.  Lab Results  Component Value Date   WBC  10.1 07/17/2015   HGB 14.0 07/17/2015   HCT 42.9 07/17/2015   MCV 89.2 07/17/2015   PLT 274 07/17/2015      Component Value Date/Time   NA 139 07/17/2015 1934   K 3.1 (L) 07/17/2015 1934   CL 108 07/17/2015 1934   CO2 21 (L) 07/17/2015 1934   GLUCOSE 117 (H)  07/17/2015 1934   BUN 7 07/17/2015 1934   CREATININE 0.74 07/17/2015 1934   CALCIUM 9.3 07/17/2015 1934   PROT 7.8 07/17/2015 1934   ALBUMIN 4.2 07/17/2015 1934   AST 19 07/17/2015 1934   ALT 19 07/17/2015 1934   ALKPHOS 99 07/17/2015 1934   BILITOT 0.5 07/17/2015 1934   GFRNONAA >60 07/17/2015 1934   GFRAA >60 07/17/2015 1934    ASSESSMENT AND PLAN  41 y.o. year old female   Chronic migraine headache  She has tried and failed multiple preventative medications in the past, this includes Topamax, Depakote, Inderal, nortriptyline, Flexeril,  Imitrex injection as needed for abortive treatment  Botox injection as migraine prevention  Protracted prolonged migraine headaches  Nerve blocks today    Levert Feinstein, M.D. Ph.D.  [facility] [address] [place], [place] [address] Phone: [phone] Fax:      [phone]

## 2016-05-31 ENCOUNTER — Encounter (HOSPITAL_COMMUNITY): Payer: Self-pay

## 2016-05-31 ENCOUNTER — Emergency Department (HOSPITAL_COMMUNITY)
Admission: EM | Admit: 2016-05-31 | Discharge: 2016-05-31 | Disposition: A | Discharge status: Home / Self Care | Payer: Medicaid Other | Attending: Emergency Medicine | Admitting: Emergency Medicine

## 2016-05-31 DIAGNOSIS — Y929 Unspecified place or not applicable: Secondary | ICD-10-CM | POA: Diagnosis not present

## 2016-05-31 DIAGNOSIS — M25562 Pain in left knee: Secondary | ICD-10-CM | POA: Diagnosis not present

## 2016-05-31 DIAGNOSIS — Y999 Unspecified external cause status: Secondary | ICD-10-CM | POA: Insufficient documentation

## 2016-05-31 DIAGNOSIS — S0033XA Contusion of nose, initial encounter: Secondary | ICD-10-CM | POA: Insufficient documentation

## 2016-05-31 DIAGNOSIS — F17210 Nicotine dependence, cigarettes, uncomplicated: Secondary | ICD-10-CM | POA: Insufficient documentation

## 2016-05-31 DIAGNOSIS — S0992XA Unspecified injury of nose, initial encounter: Secondary | ICD-10-CM | POA: Diagnosis present

## 2016-05-31 DIAGNOSIS — Z91040 Latex allergy status: Secondary | ICD-10-CM | POA: Insufficient documentation

## 2016-05-31 DIAGNOSIS — Y939 Activity, unspecified: Secondary | ICD-10-CM | POA: Insufficient documentation

## 2016-05-31 MED ORDER — OXYCODONE-ACETAMINOPHEN 7.5-325 MG PO TABS
1.0000 | ORAL_TABLET | Freq: Once | ORAL | Status: AC
  Administered 2016-05-31: 1 via ORAL
  Filled 2016-05-31: qty 1

## 2016-05-31 MED ORDER — OXYMETAZOLINE HCL 0.05 % NA SOLN: 2.0000 | NASAL | 0 refills | Status: DC | PRN
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
## 2016-06-03 ENCOUNTER — Ambulatory Visit (INDEPENDENT_AMBULATORY_CARE_PROVIDER_SITE_OTHER): Payer: Medicaid Other | Admitting: Family

## 2016-06-15 ENCOUNTER — Ambulatory Visit: Payer: Medicaid Other | Admitting: Neurology

## 2016-06-23 ENCOUNTER — Telehealth: Payer: Self-pay | Admitting: Neurology

## 2016-06-23 NOTE — Telephone Encounter (Signed)
Pt wanting a call back re: botox please call her at [phone]

## 2016-06-24 NOTE — Telephone Encounter (Signed)
I have submitted for this patient twice. Both times she has been denied. They are stating that the patient has not tried 3 different drug classes. I spoke with the patient to inform her of this. She is going to call [facility] and discuss the denial with them. I told her I would notify Dr. Terrace ArabiaYan of the denial.

## 2016-07-25 ENCOUNTER — Encounter (INDEPENDENT_AMBULATORY_CARE_PROVIDER_SITE_OTHER): Payer: Self-pay | Admitting: *Deleted

## 2016-07-27 ENCOUNTER — Telehealth: Payer: Self-pay | Admitting: Neurology

## 2016-07-27 NOTE — Telephone Encounter (Signed)
Patient called and said she needs to see Dr Terrace ArabiaYan ASAP for f/u please call her [phone] thanks Diane

## 2016-07-27 NOTE — Telephone Encounter (Signed)
She would like to be seen for her worsening migraines with vision changes.  She has seen Eber Jonesarolyn in the past and has been added to her schedule in an available slot.

## 2016-07-28 NOTE — Telephone Encounter (Signed)
Pt has appt 07/29/16

## 2016-07-29 ENCOUNTER — Encounter (INDEPENDENT_AMBULATORY_CARE_PROVIDER_SITE_OTHER): Payer: Self-pay

## 2016-07-29 ENCOUNTER — Ambulatory Visit (INDEPENDENT_AMBULATORY_CARE_PROVIDER_SITE_OTHER): Payer: Medicaid Other | Admitting: Nurse Practitioner

## 2016-07-29 ENCOUNTER — Encounter: Payer: Self-pay | Admitting: Nurse Practitioner

## 2016-07-29 VITALS — BP 105/79 | HR 94 | Wt 192.4 lb

## 2016-07-29 DIAGNOSIS — G43719 Chronic migraine without aura, intractable, without status migrainosus: Secondary | ICD-10-CM

## 2016-07-29 DIAGNOSIS — G43709 Chronic migraine without aura, not intractable, without status migrainosus: Secondary | ICD-10-CM

## 2016-07-29 DIAGNOSIS — IMO0002 Reserved for concepts with insufficient information to code with codable children: Secondary | ICD-10-CM

## 2016-07-29 NOTE — Patient Instructions (Signed)
Will attempt to get Botox since she has been on multiple preventives.  You have sumatriptan and Maxalt to pick up at the drug store Follow-up with Dr. Terrace ArabiaYan Botox after approval

## 2016-07-29 NOTE — Progress Notes (Signed)
[facility]  PATIENT: Brooke [name] DOB: 1975/09/27   REASON FOR VISIT: Follow-up for headaches  HISTORY FROM: Patient    HISTORY OF PRESENT ILLNESS:HISTORY [name] Spradlinis a 41 year old right-handed female, referred by her primary care physician Dr. Julio Sicks for evaluation of diffuse body achy pain, migraine  She carries a diagnosis of chronic migraine since 1983,, fibromyalgia since 2011, she complains of chronic neck pain, low back pain, joints pain, radiating pain to her left arm,  She moved from [place] to [place] in November 2013, has been under the [facility] pain management. Over the years, she has tried different medications,   Previously she has tried and failed Topamax, trigger point injection, which actually made her headache worse while she was in [place], she reported frequent emergency room visit, 3 times each month, receiving Dilaudid, She was also treated with Opana, fentanyl patch, Demerol in the past. she is now taking Norco,from her pain management  She reported long-standing history of migraine, her typical migraine are right retro-orbital area severe pounding headache with associated light noise sensitivity, resting helps, lasting few hours,  She has tried Fioricet, Flexeril, Norco,without help, previously she has tried different triptans, including Imitrex, Zomig, Maxalt, without helping,  She has tried and failed preventive medications Topamax, Depakote, Inderal, nortriptyline, Flexeril,  Has tried different abortive treatment, including Imitrex injection, sometimes make her headache worse, Zomig, Maxalt, Imitrex tablets,   Trigger for her headaches are environmental allergy, certain food, such as spicy food, acidic food,  She complains of few years history of bilateral feet parestheisa.increased after bearing weight , reported abnormal EMG nerve conduction study from outside facility in May 2015 consistent with  peripheral neuropathy  We have reviewed MRI of cervical, and lumbar spine at [facility] in June 2015, there was evidence of multilevel degenerative disc disease, there was no significant canal, or foraminal stenosis  She is now currently taking Nucynta ER 150 mg every day for chronic migraine headache, she also daily Excedrin Migraine for her headaches, up to 4 tablets each day,   Migraine Frequency: daily since 2015, increased frequency and severity since Nucynta ER dosage was increased from 100 to 150mg  dose was increased in June 2017. She has severe headache was associated light, noise sensitivity, had to stay in dark quiet room, have her sunglasses even indoor.  Migraine Last from 5 hours to 12 hours.  UPDATE May 30 2016: We have discussed about Botox injection at her last visit in June 2017, but it never followed through, She reported more than 25 days of migraine in one month, this has been ongoing since last 10 years, She has been on chronic narcotics treatment, recently had major change of her pain medications, she is now taking percocet 10/325mg  4 tablets a day,since February 2018, she is still on long-acting narcotics Embeda 50/2 mg 1 capsule daily, he is also taking clonazepam 1 mg twice a day, BuSpar 15 mg twice a day, baclofen 10 mg twice a day, Lyrica 75 mg twice a day,  She lying in the dark trying to help her pain, she end up spending most of her time in bed, she lives with her family, she also smokes, is applying for disability.  She complains 8 out of 10 bilateral frontal area throbbing headache with associated light noise sensitivity, and times with associated nausea, dizziness, lasting hours to up to one week. UPDATE 06/08/2018CM Ms.Cienfuegos, 41 year old female returns for follow-up with history of migraines. She reports a migraine more than 20 days  a month and this is been going on for many years. Her migraines can last from 5-12 hours. They occur in the right  retro-orbital area pounding sensation associated with light and noise sensitivity. Laying down in a dark room helps. She has failed multiple preventives in the past Topamax and Depakote seizure medications Inderal beta blocker Nortriptyline tricyclic antidepressant Flexeril muscle relaxant She has failed Zomig Relpax and Imitrex acutely which are triptans She received trigger point injections at her last visit which were beneficial by Dr. Terrace Arabia she returns for reevaluation  REVIEW OF SYSTEMS: Full 14 system review of systems performed and notable only for those listed, all others are neg:  Constitutional: neg  Cardiovascular: neg Ear/Nose/Throat: neg  Skin: neg Eyes: Blurred vision, light sensitivity Respiratory: neg Gastroitestinal: neg  Hematology/Lymphatic: neg  Endocrine: neg Musculoskeletal: Chronic pain Allergy/Immunology: neg Neurological: Headache, dizziness Psychiatric: neg Sleep : neg   ALLERGIES: Allergies  Allergen Reactions  . Digoxin And Related Nausea And Vomiting    Elevated 'white count'  . Toradol [Ketorolac Tromethamine] Other (See Comments)    Uncontrollable 'shakes'  . Latex Hives and Swelling  . Other Other (See Comments)    Any nasal sprays cause serious migranes  . Tape Itching and Rash    Please use "paper" tape    HOME MEDICATIONS: Outpatient Medications Prior to Visit  Medication Sig Dispense Refill  . baclofen (LIORESAL) 10 MG tablet Take 10 mg by mouth 2 (two) times daily.    . busPIRone (BUSPAR) 15 MG tablet Take 15 mg by mouth 2 (two) times daily.    Marland Kitchen doxepin (SINEQUAN) 25 MG capsule Take 25 mg by mouth at bedtime.  0  . EMBEDA 50-2 MG CPCR Take 1 capsule by mouth daily.  0  . levonorgestrel (MIRENA) 20 MCG/24HR IUD 1 each by Intrauterine route once.    Marland Kitchen LYRICA 75 MG capsule Take 75 mg by mouth 2 (two) times daily.  0  . ondansetron (ZOFRAN ODT) 4 MG disintegrating tablet Take 1 tablet (4 mg total) by mouth every 8 (eight) hours as  needed. 20 tablet 6  . oxyCODONE-acetaminophen (PERCOCET) 10-325 MG tablet Take 1 tablet by mouth every 6 (six) hours as needed.  0  . rizatriptan (MAXALT-MLT) 10 MG disintegrating tablet Take 1 tablet (10 mg total) by mouth as needed. May repeat in 2 hours if needed (Patient not taking: Reported on 07/29/2016) 15 tablet 6  . SUMAtriptan (IMITREX) 6 MG/0.5ML SOLN injection Inject 0.5 mLs (6 mg total) into the skin every 2 (two) hours as needed for migraine or headache. May repeat in 2 hours if headache persists or recurs. (Patient not taking: Reported on 07/29/2016) 10 vial 11  . clonazePAM (KLONOPIN) 1 MG tablet Take 0.5-1 mg by mouth 2 (two) times daily. Reported on 08/04/2015    . naproxen (EC NAPROSYN) 500 MG EC tablet Take 1 tablet (500 mg total) by mouth as needed. (Patient not taking: Reported on 07/29/2016) 60 tablet 6  . oxymetazoline (AFRIN NASAL SPRAY) 0.05 % nasal spray Place 2 sprays into both nostrils every 4 (four) hours as needed (bleeding). (Patient not taking: Reported on 07/29/2016) 30 mL 0  . TRINTELLIX 10 MG TABS Take 10 mg by mouth daily.  0   No facility-administered medications prior to visit.     PAST MEDICAL HISTORY: Past Medical History:  Diagnosis Date  . Allergic rhinitis   . Anxiety   . Chiari malformation   . DDD (degenerative disc disease), cervical   .  Degenerative disc disease, lumbar   . Depression   . Fibromyalgia   . Hyperlipemia   . Migraine   . Scoliosis   . WPW (Wolff-Parkinson-White syndrome)     PAST SURGICAL HISTORY: Past Surgical History:  Procedure Laterality Date  . CESAREAN SECTION    . CYSTOSCOPY W/ URETERAL STENT PLACEMENT Right 04/25/2015   Procedure: CYSTOSCOPY WITH RETROGRADE PYELOGRAM/URETERAL STENT PLACEMENT;  Surgeon: Ihor GullyMark Ottelin, MD;  Location: [facility];  Service: Urology;  Laterality: Right;  . ears Bilateral   . EYE SURGERY Bilateral   . herneated bowel repair    . HERNIA REPAIR    . HOLMIUM LASER APPLICATION Right 04/25/2015    Procedure: HOLMIUM LASER APPLICATION;  Surgeon: Ihor GullyMark Ottelin, MD;  Location: [facility];  Service: Urology;  Laterality: Right;  . KNEE SURGERY Bilateral    Right Knee x 1 - Left x3  . TUBAL LIGATION    . vein removed Left    arm    FAMILY HISTORY: Family History  Problem Relation Age of Onset  . Migraines Mother   . CAD Father   . Migraines Daughter     SOCIAL HISTORY: Social History   Social History  . Marital status: Divorced    Spouse name: N/A  . Number of children: 2  . Years of education: 12   Occupational History  .      Not working   Social History Main Topics  . Smoking status: Current Every Day Smoker    Packs/day: 0.25    Types: Cigarettes  . Smokeless tobacco: Never Used  . Alcohol use No  . Drug use: No  . Sexual activity: Not on file   Other Topics Concern  . Not on file   Social History Narrative   Patient lives at home with her two children and her mother.   Unemployed. - Patient trying to get social security.   Education high school.   Right handed.   Caffeine soda's four soda's daily.     PHYSICAL EXAM  Vitals:   07/29/16 0915  BP: 105/79  Pulse: 94  Weight: 192 lb 6.4 oz (87.3 kg)   Body mass index is 32.02 kg/m.  Generalized: Well developed, obese female in no acute distress  Head: normocephalic and atraumatic,. Oropharynx benign  Neck: Supple, no carotid bruits  Cardiac: Regular rate rhythm, no murmur  Musculoskeletal: No deformity   Neurological examination   Mentation: Alert oriented to time, place, history taking. Attention span and concentration appropriate. Recent and remote memory intact.  Follows all commands speech and language fluent.   Cranial nerve II-XII: Fundoscopic exam not done Pupils were equal round reactive to light extraocular movements were full, visual field were full on confrontational test. Facial sensation and strength were normal. hearing was intact to finger rubbing bilaterally. Uvula tongue midline.  head turning and shoulder shrug were normal and symmetric.Tongue protrusion into cheek strength was normal. Motor: normal bulk and tone, full strength in the BUE, BLE, fine finger movements normal, no pronator drift. No focal weakness Sensory: normal and symmetric to light touch, pinprick, and  Vibration, in the upper and lower extremities Coordination: finger-nose-finger, heel-to-shin bilaterally, no dysmetria Reflexes: Brachioradialis 2/2, biceps 2/2, triceps 2/2, patellar 2/2, Achilles 2/2, plantar responses were flexor bilaterally. Gait and Station: Rising up from seated position without assistance, normal stance,  moderate stride, good arm swing, smooth turning, able to perform tiptoe, and heel walking without difficulty. Tandem gait is steady  DIAGNOSTIC DATA (LABS, IMAGING, TESTING) -  I reviewed patient records, labs, notes, testing and imaging myself where available.  Lab Results  Component Value Date   WBC 10.1 07/17/2015   HGB 14.0 07/17/2015   HCT 42.9 07/17/2015   MCV 89.2 07/17/2015   PLT 274 07/17/2015      Component Value Date/Time   NA 139 07/17/2015 1934   K 3.1 (L) 07/17/2015 1934   CL 108 07/17/2015 1934   CO2 21 (L) 07/17/2015 1934   GLUCOSE 117 (H) 07/17/2015 1934   BUN 7 07/17/2015 1934   CREATININE 0.74 07/17/2015 1934   CALCIUM 9.3 07/17/2015 1934   PROT 7.8 07/17/2015 1934   ALBUMIN 4.2 07/17/2015 1934   AST 19 07/17/2015 1934   ALT 19 07/17/2015 1934   ALKPHOS 99 07/17/2015 1934   BILITOT 0.5 07/17/2015 1934   GFRNONAA >60 07/17/2015 1934   GFRAA >60 07/17/2015 1934    ASSESSMENT AND PLAN  41 y.o. year old female  has a past medical history of Allergic rhinitis; Anxiety; Chiari malformation; DDD (degenerative disc disease), cervical; Degenerative disc disease, lumbar; Depression; Fibromyalgia; Hyperlipemia; Migraine; Scoliosis; and WPW (Wolff-Parkinson-White syndrome). Here to follow-up for chronic migraine headache. She has failed multiple  preventives in the past. These include 1Topamax and Depakote seizure medications 2Inderal beta blocker 3Nortriptyline tricyclic antidepressant 4Flexeril muscle relaxant She has failed Zomig Relpax and Imitrex acutely which are triptans    Will attempt to get Botox approved since she has been on multiple preventives.  You have sumatriptan and Maxalt to pick up at the drug store I spent 25 minutes in total face to face time with the patient more than 50% of which was spent counseling and coordination of care, reviewing test results reviewing medications and discussing and reviewing the diagnosis of migraine headache and further treatment options such as Botox and she has failed multiple preventives in the past along with acute medications.Cline Crock, [facility], APRN  [facility] [address], [address] [place], [place] [address] [phone]

## 2016-08-02 ENCOUNTER — Telehealth: Payer: Self-pay | Admitting: Nurse Practitioner

## 2016-08-02 NOTE — Telephone Encounter (Signed)
Pt said she forgot that Bonnell PublicMichael Roche,PA at PCP office said she needed to have a MRI but wanted it from neurology. She is wanting to know if that could be ordered. Please call

## 2016-08-02 NOTE — Telephone Encounter (Signed)
Eber JonesCarolyn, will you review this message and see if patient is appropriate for MRi brain? Thanks Dr. Lucia GaskinsAhern

## 2016-08-03 NOTE — Telephone Encounter (Signed)
Pt w/ hx of migraines and fibromyalgia was seen by Eber Jonesarolyn for OV on 07/29/16. She's tried and failed Topamax, Depakote, Inderal, Pamelor, Flexeril. Zomig, Relpax and Imitrex. CT head completed 01/2016 showed no acute intracranial abnormality. No significant change. No hydrocephalus. Approval for Botox is in progress.

## 2016-08-03 NOTE — Telephone Encounter (Signed)
Victorino DikeJennifer please check with Dr. Terrace ArabiaYan. Recent CT of the head normal

## 2016-08-05 NOTE — Progress Notes (Signed)
I have reviewed and agreed above plan. 

## 2016-08-09 ENCOUNTER — Telehealth: Payer: Self-pay | Admitting: Nurse Practitioner

## 2016-08-09 DIAGNOSIS — R51 Headache: Principal | ICD-10-CM

## 2016-08-09 DIAGNOSIS — R251 Tremor, unspecified: Secondary | ICD-10-CM

## 2016-08-09 DIAGNOSIS — R519 Headache, unspecified: Secondary | ICD-10-CM

## 2016-08-09 NOTE — Telephone Encounter (Signed)
Carolyn, we received a new referral frEber Jonesom Dr. Julio Sickssei-Bonsu on patient Brooke [name] for tremors, when I called the patient she stated that she did not need another apt because she had just saw you on 07/29/16 for migraines and tremors patient said all she needs is a Brain MRI. I have spoken with Dr. Terrace ArabiaYan and she asked that I send you a message and have you to please review the patient's chart for possible MRI of the brain.

## 2016-08-09 NOTE — Telephone Encounter (Signed)
Received new referral for tremors, patient also has history of worsening headaches will schedule MRI of the brain. Discussed with Dr. Terrace ArabiaYan

## 2016-08-10 NOTE — Telephone Encounter (Signed)
Spoke to pt and relayed that received referral for tremors,  Per CM/NP and Dr, Terrace ArabiaYan did order MRI Brain.  Authorization in process and she will be called to schedule once done.   She verbalized understanding.

## 2016-08-18 ENCOUNTER — Telehealth: Payer: Self-pay | Admitting: Nurse Practitioner

## 2016-08-18 NOTE — Telephone Encounter (Signed)
Please let patient know that Medicaid will not approve her MRI. Her most recent CT of the head December 2017 without acute intracranial abnormality.

## 2016-08-18 NOTE — Telephone Encounter (Signed)
Dondra SpryGail at [facility] just emailed me and informed me that Medicaid did not approve the MRI. Medicaid states "Based on [employer] Head Imaging Guidelines, we are unable to approve the requested study. Advanced imaging is not supported for the evaluation of a primary headache disorder in the absence of focal neurological deficits. The clinical information provided does not describe a condition other than a primary headache disorder and, therefore, the requested imaging is not indicated at this time." If you would like to do a peer to peer the phone number is [phone] and the case number is  [id number]. She is scheduled to have this MRI done Sunday July 1.

## 2016-08-18 NOTE — Telephone Encounter (Signed)
Spoke to pt and let her know that Medicaid denied request.  Her ofv note was RV for migraines, no mention of tremors.  When in for BOTOX injections (after approved) can address again with Dr. Terrace ArabiaYan and re submit for MRI if still needed.  Pt verbalized understanding.  Cancelled MRI for 08-21-16 (per Irving BurtonEmily calling [facility]).

## 2016-08-21 ENCOUNTER — Other Ambulatory Visit: Payer: Medicaid Other

## 2016-10-14 ENCOUNTER — Other Ambulatory Visit: Payer: Self-pay | Admitting: Internal Medicine

## 2016-10-14 DIAGNOSIS — M545 Low back pain: Secondary | ICD-10-CM

## 2016-10-14 DIAGNOSIS — M4800 Spinal stenosis, site unspecified: Secondary | ICD-10-CM

## 2016-10-23 ENCOUNTER — Other Ambulatory Visit: Payer: Medicaid Other

## 2016-10-31 ENCOUNTER — Ambulatory Visit
Admission: RE | Admit: 2016-10-31 | Discharge: 2016-10-31 | Disposition: A | Discharge status: Home / Self Care | Payer: Medicaid Other | Source: Ambulatory Visit | Attending: Internal Medicine | Admitting: Internal Medicine

## 2016-10-31 DIAGNOSIS — M545 Low back pain: Secondary | ICD-10-CM

## 2016-10-31 DIAGNOSIS — M4800 Spinal stenosis, site unspecified: Secondary | ICD-10-CM

## 2016-12-19 ENCOUNTER — Telehealth: Payer: Self-pay | Admitting: Nurse Practitioner

## 2016-12-19 ENCOUNTER — Other Ambulatory Visit: Payer: Self-pay | Admitting: Nurse Practitioner

## 2016-12-19 NOTE — Telephone Encounter (Signed)
Pt called SUMAtriptan (IMITREX) 6 MG/0.5ML SOLN injection needs PA and naproxen per [facility]. Pt said she is just now picking up these RX dated Jone 2018, pt said she forgot about them

## 2016-12-19 NOTE — Telephone Encounter (Signed)
error 

## 2016-12-21 NOTE — Telephone Encounter (Signed)
Intiated the PA [facility] the [facility]  PA [id number],  IR# [id number].  Sent to clinical for review.  Check in tomorrow for results.  Attempted to call pt and she was not available.

## 2016-12-21 NOTE — Telephone Encounter (Signed)
Patient is calling back regarding getting PA for SUMAtriptan (IMITREX) 6 MG/0.5ML SOLN injection.Please call patient and discuss.

## 2016-12-23 MED ORDER — NAPROXEN 500 MG PO TABS: ORAL_TABLET | ORAL | 3 refills | Status: DC

## 2016-12-23 NOTE — Telephone Encounter (Addendum)
She has failed multiple preventives in the past Topamax and Depakote seizure medications Inderal beta blocker Nortriptyline tricyclic antidepressant Flexeril muscle relaxant  Start BOTOX preauthorization per record.  It is okay to give her naproxen 500 mg as needed

## 2016-12-23 NOTE — Telephone Encounter (Addendum)
This pts [facility] pa was denied for imitrex injectible due to pt not being on nsaids for last year and not on a migraine preventative.  Pt has taken 2 preferred drugs (sumatriptan and riztriptan) which she has failed.   I spoke to pt and relayed this.  I went over her med list.  The naproxen (plain requires no PA).  Do you want to place on preventative and plain naproxen?

## 2016-12-23 NOTE — Addendum Note (Signed)
Addended by: Levert FeinsteinYAN, Madicyn Mesina on: 12/23/2016 11:47 AM   Modules accepted: Orders

## 2016-12-23 NOTE — Addendum Note (Signed)
Addended by: Hermenia FiscalYOUNG, Buddie Marston S on: 12/23/2016 11:26 AM   Modules accepted: Orders

## 2016-12-26 NOTE — Telephone Encounter (Signed)
This patient has been denied several times from [place] [facility] for botox. I will resubmit authorization.

## 2017-01-18 ENCOUNTER — Encounter: Payer: Self-pay | Admitting: Neurology

## 2017-01-18 ENCOUNTER — Ambulatory Visit: Payer: Medicaid Other | Admitting: Neurology

## 2017-01-18 VITALS — BP 117/87 | HR 91 | Ht 65.0 in | Wt 200.0 lb

## 2017-01-18 DIAGNOSIS — G43719 Chronic migraine without aura, intractable, without status migrainosus: Secondary | ICD-10-CM | POA: Diagnosis not present

## 2017-01-18 MED ORDER — RIZATRIPTAN BENZOATE 10 MG PO TBDP
10.0000 mg | ORAL_TABLET | ORAL | 11 refills | Status: DC | PRN
Start: 2017-01-18 — End: 2017-01-19

## 2017-01-18 NOTE — Progress Notes (Signed)
[facility]  PATIENT: Brooke [name] DOB: 03/12/1975   REASON FOR VISIT: Follow-up for headaches  HISTORY FROM: Patient  HISTORY OF PRESENT ILLNESS: Brooke [name] a 41 year old right-handed [name], referred by her primary care physician Dr. Julio Sickssei-Bonsu for evaluation of diffuse body achy pain, migraine  She carries a diagnosis of chronic migraine since 1983,, fibromyalgia since 2011, she complains of chronic neck pain, low back pain, joints pain, radiating pain to her left arm,  She moved from [place] to [place] in November 2013, has been under the [facility] pain management. Over the years, she has tried different medications,   Previously she has tried and failed Topamax, trigger point injection, which actually made her headache worse while she was in [place], she reported frequent emergency room visit, 3 times each month, receiving Dilaudid, She was also treated with Opana, fentanyl patch, Demerol in the past. she is now taking Norco,from her pain management  She reported long-standing history of migraine, her typical migraine are right retro-orbital area severe pounding headache with associated light noise sensitivity, resting helps, lasting few hours,  She has tried Fioricet, Flexeril, Norco,without help, previously she has tried different triptans, including Imitrex, Zomig, Maxalt, without helping,  She has tried and failed preventive medications Topamax, Depakote, Inderal, nortriptyline, Flexeril,  Has tried different abortive treatment, including Imitrex injection, sometimes make her headache worse, Zomig, Maxalt, Imitrex tablets,   Trigger for her headaches are environmental allergy, certain food, such as spicy food, acidic food,  She complains of few years history of bilateral feet parestheisa.increased after bearing weight , reported abnormal EMG nerve conduction study from outside facility in May 2015 consistent with peripheral  neuropathy  We have reviewed [facility] cervical[facility], there was evidence of multilevel degenerative disc disease, there was no significant canal, or foraminal stenosis  She is now currently taking Nucynta ER 150 mg every day for chronic migraine headache, she also daily Excedrin Migraine for her headaches, up to 4 tablets each day,   Migraine Frequency: daily since 2015, increased frequency and severity since Nucynta ER dosage was increased from 100 to 150mg  dose was increased in June 2017. She has severe headache was associated light, noise sensitivity, had to stay in dark quiet room, have her sunglasses even indoor.  Migraine Last from 5 hours to 12 hours.  UPDATE May 30 2016: We have discussed about Botox injection at her last visit in June 2017, but it never followed through, She reported more than 25 days of migraine in one month, this has been ongoing since last 10 years, She has been on chronic narcotics treatment, recently had major change of her pain medications, she is now taking percocet 10/325mg  4 tablets a day,since February 2018, she is still on long-acting narcotics Embeda 50/2 mg 1 capsule daily, he is also taking clonazepam 1 mg twice a day, BuSpar 15 mg twice a day, baclofen 10 mg twice a day, Lyrica 75 mg twice a day,  She lying in the dark trying to help her pain, she end up spending most of her time in bed, she lives with her family, she also smokes, is applying for disability.  She complains 8 out of 10 bilateral frontal area throbbing headache with associated light noise sensitivity, and times with associated nausea, dizziness, lasting hours to up to one week.  Update November 28th 2018 About 6 years ago in 2012, she had multiple Botox injections every 3 months at [place], responded  very well, She is now complains of daily moderate to severe migraine headaches, bilateral frontal, parietal region, at least 6 out of 10, on  polypharmacy treatment, Excedrin Migraine daily for migraine headaches,  Previously she has tried and failed multiple preventive medications, Topamax and Depakote antiepileptic medications Inderal beta blocker Nortriptyline tricyclic antidepressant Flexeril muscle relaxant She has failed Zomig Relpax and Imitrex acutely which are triptans   REVIEW OF SYSTEMS: Full 14 system review of systems performed and notable only for those listed, all others are neg:  Constitutional: neg  Cardiovascular: neg Ear/Nose/Throat: neg  Skin: neg Eyes: Blurred vision, light sensitivity Respiratory: neg Gastroitestinal: neg  Hematology/Lymphatic: neg  Endocrine: neg Musculoskeletal: Chronic pain Allergy/Immunology: neg Neurological: Headache, dizziness Psychiatric: neg Sleep : neg   ALLERGIES: Allergies  Allergen Reactions  . Digoxin And Related Nausea And Vomiting    Elevated 'white count'  . Toradol [Ketorolac Tromethamine] Other (See Comments)    Uncontrollable 'shakes'  . Latex Hives and Swelling  . Other Other (See Comments)    Any nasal sprays cause serious migranes  . Tape Itching and Rash    Please use "paper" tape    HOME MEDICATIONS: Outpatient Medications Prior to Visit  Medication Sig Dispense Refill  . baclofen (LIORESAL) 10 MG tablet Take 10 mg by mouth 2 (two) times daily.    . botulinum toxin Type A (BOTOX) 100 units SOLR injection Inject 200 Units into the muscle every 3 (three) months.    . busPIRone (BUSPAR) 15 MG tablet Take 15 mg by mouth 2 (two) times daily.    Marland Kitchen doxepin (SINEQUAN) 25 MG capsule Take 25 mg by mouth at bedtime.  0  . levonorgestrel (MIRENA) 20 MCG/24HR IUD 1 each by Intrauterine route once.    Marland Kitchen LYRICA 75 MG capsule Take 75 mg by mouth 2 (two) times daily.  0  . oxyCODONE-acetaminophen (PERCOCET) 10-325 MG tablet Take 1 tablet by mouth every 6 (six) hours as needed.  0  . naproxen (NAPROSYN) 500 MG tablet 1 tablet as needed after meal for migraine  headaches 30 tablet 3  . ondansetron (ZOFRAN ODT) 4 MG disintegrating tablet Take 1 tablet (4 mg total) by mouth every 8 (eight) hours as needed. 20 tablet 6  . rizatriptan (MAXALT-MLT) 10 MG disintegrating tablet Take 1 tablet (10 mg total) by mouth as needed. May repeat in 2 hours if needed 15 tablet 6  . SUMAtriptan (IMITREX) 6 MG/0.5ML SOLN injection Inject 0.5 mLs (6 mg total) into the skin every 2 (two) hours as needed for migraine or headache. May repeat in 2 hours if headache persists or recurs. 10 vial 11   No facility-administered medications prior to visit.     PAST MEDICAL HISTORY: Past Medical History:  Diagnosis Date  . Allergic rhinitis   . Anxiety   . Chiari malformation   . DDD (degenerative disc disease), cervical   . Degenerative disc disease, lumbar   . Depression   . Fibromyalgia   . Hyperlipemia   . Migraine   . Scoliosis   . WPW (Wolff-Parkinson-White syndrome)     PAST SURGICAL HISTORY: Past Surgical History:  Procedure Laterality Date  . CESAREAN SECTION    . CYSTOSCOPY W/ URETERAL STENT PLACEMENT Right 04/25/2015   Procedure: CYSTOSCOPY WITH RETROGRADE PYELOGRAM/URETERAL STENT PLACEMENT;  Surgeon: Ihor Gully, MD;  Location: [facility];  Service: Urology;  Laterality: Right;  . ears Bilateral   . EYE SURGERY Bilateral   . herneated bowel repair    .  HERNIA REPAIR    . HOLMIUM LASER APPLICATION Right 04/25/2015   Procedure: HOLMIUM LASER APPLICATION;  Surgeon: Ihor Gully, MD;  Location: [facility];  Service: Urology;  Laterality: Right;  . KNEE SURGERY Bilateral    Right Knee x 1 - Left x3  . TUBAL LIGATION    . vein removed Left    arm    FAMILY HISTORY: Family History  Problem Relation Age of Onset  . Migraines Mother   . CAD Father   . Migraines Daughter     SOCIAL HISTORY: Social History   Socioeconomic History  . Marital status: Divorced    Spouse name: Not on file  . Number of children: 2  . Years of education: 6  . Highest education  level: Not on file  Social Needs  . Financial resource strain: Not on file  . Food insecurity - worry: Not on file  . Food insecurity - inability: Not on file  . Transportation needs - medical: Not on file  . Transportation needs - non-medical: Not on file  Occupational History    Comment: Not working  Tobacco Use  . Smoking status: Current Every Day Smoker    Packs/day: 0.25    Types: Cigarettes  . Smokeless tobacco: Never Used  Substance and Sexual Activity  . Alcohol use: No    Alcohol/week: 0.0 oz  . Drug use: No  . Sexual activity: Not on file  Other Topics Concern  . Not on file  Social History Narrative   Patient lives at home with her two children and her mother.   Unemployed. - Patient trying to get social security.   Education high school.   Right handed.   Caffeine soda's four soda's daily.     PHYSICAL EXAM  Vitals:   01/18/17 1344  BP: 117/87  Pulse: 91  Weight: 200 lb (90.7 kg)  Height: 5\' 5"  (1.651 m)   Body mass index is 33.28 kg/m.  Generalized: Well developed, obese [name] in no acute distress  Head: normocephalic and atraumatic,. Oropharynx benign  Neck: Supple, no carotid bruits  Cardiac: Regular rate rhythm, no murmur  Musculoskeletal: No deformity   Neurological examination   Mentation: Alert oriented to time, place, history taking. Attention span and concentration appropriate. Recent and remote memory intact.  Follows all commands speech and language fluent.   Cranial nerve II-XII: Fundoscopic exam not done Pupils were equal round reactive to light extraocular movements were full, visual field were full on confrontational test. Facial sensation and strength were normal. hearing was intact to finger rubbing bilaterally. Uvula tongue midline. head turning and shoulder shrug were normal and symmetric.Tongue protrusion into cheek strength was normal. Motor: normal bulk and tone, full strength in the BUE, BLE, fine finger movements normal, no  pronator drift. No focal weakness Sensory: normal and symmetric to light touch, pinprick, and  Vibration, in the upper and lower extremities Coordination: finger-nose-finger, heel-to-shin bilaterally, no dysmetria Reflexes: Brachioradialis 2/2, biceps 2/2, triceps 2/2, patellar 2/2, Achilles 2/2, plantar responses were flexor bilaterally. Gait and Station: Rising up from seated position without assistance, normal stance,  moderate stride, good arm swing, smooth turning, able to perform tiptoe, and heel walking without difficulty. Tandem gait is steady  DIAGNOSTIC DATA (LABS, IMAGING, TESTING) - I reviewed patient records, labs, notes, testing and imaging myself where available.  Lab Results  Component Value Date   WBC 10.1 07/17/2015   HGB 14.0 07/17/2015   HCT 42.9 07/17/2015   MCV 89.2 07/17/2015  PLT 274 07/17/2015      Component Value Date/Time   NA 139 07/17/2015 1934   K 3.1 (L) 07/17/2015 1934   CL 108 07/17/2015 1934   CO2 21 (L) 07/17/2015 1934   GLUCOSE 117 (H) 07/17/2015 1934   BUN 7 07/17/2015 1934   CREATININE 0.74 07/17/2015 1934   CALCIUM 9.3 07/17/2015 1934   PROT 7.8 07/17/2015 1934   ALBUMIN 4.2 07/17/2015 1934   AST 19 07/17/2015 1934   ALT 19 07/17/2015 1934   ALKPHOS 99 07/17/2015 1934   BILITOT 0.5 07/17/2015 1934   GFRNONAA >60 07/17/2015 1934   GFRAA >60 07/17/2015 1934    ASSESSMENT AND PLAN  41 y.o. year old [name]   Chronic migraine headaches, Polypharmacy treatment  She has tried and failed multiple preventive medications in the past: 1Topamax and Depakote seizure medications 2Inderal beta blocker 3Nortriptyline tricyclic antidepressant 4Flexeril muscle relaxant She has failed Zomig Relpax and Imitrex acutely which are triptans  BOTOX injection was performed according to protocol by [employer]. 100 units of BOTOX was dissolved into 2 cc NS.        Patient tolerate the injection well. Will return for repeat injection in 3  months.  Extra 45 units were injected into bilateral parietal, upper cervical paraspinals.  Levert FeinsteinYijun Vinaya Sancho, M.D. Ph.D.  [facility] [address] [place], [place] [address] Phone: [phone] Fax:      [phone]

## 2017-01-18 NOTE — Progress Notes (Signed)
**  Botox 100 units x 2 vials, NDC 0023-1145-01, Lot [id number], Exp 06/2019, specialty pharmacy.//mck,rn** 

## 2017-01-19 ENCOUNTER — Other Ambulatory Visit: Payer: Self-pay | Admitting: *Deleted

## 2017-01-19 ENCOUNTER — Telehealth: Payer: Self-pay | Admitting: Neurology

## 2017-01-19 MED ORDER — RIZATRIPTAN BENZOATE 10 MG PO TBDP: ORAL_TABLET | ORAL | 11 refills | Status: DC

## 2017-01-19 NOTE — Telephone Encounter (Signed)
Pt is asking RN Marcelino DusterMichelle calls her back re: the side effects she is having from Botox from yesterday

## 2017-01-19 NOTE — Telephone Encounter (Signed)
Spoke to patient - she developed a migraine last evening.  She was here for Botox yesterday.  She has only tried Excedrin Migraine.  She will go to the pharmacy and pick up her Maxalt today.  Dr. Terrace ArabiaYan also offered ondansetron but she declined the prescription (nausea is not a problem).  She will try the rescue medication and will lay down to rest in a quiet, dark room.

## 2017-02-15 ENCOUNTER — Telehealth: Payer: Self-pay | Admitting: Nurse Practitioner

## 2017-02-15 NOTE — Telephone Encounter (Signed)
Error

## 2017-02-15 NOTE — Progress Notes (Deleted)
[facility]  PATIENT: Brooke [name] DOB: Sep 01, 1975   REASON FOR VISIT: *** HISTORY FROM:    HISTORY OF PRESENT ILLNESS:Brooke Spradlinis a 41 year old right-handed female, referred by her primary care physician Dr. Julio Sicks for evaluation of diffuse body achy pain, migraine  She carries a diagnosis of chronic migraine since 1983,, fibromyalgia since 2011, she complains of chronic neck pain, low back pain, joints pain, radiating pain to her left arm,  She moved from [place] to [place] in November 2013, has been under the [facility] pain management. Over the years, she has tried different medications,   Previously she has tried and failed Topamax, trigger point injection, which actually made her headache worse while she was in [place], she reported frequent emergency room visit, 3 times each month, receiving Dilaudid, She was also treated with Opana, fentanyl patch, Demerol in the past. she is now taking Norco,from her pain management  She reported long-standing history of migraine, her typical migraine are right retro-orbital area severe pounding headache with associated light noise sensitivity, resting helps, lasting few hours,  She has tried Fioricet, Flexeril, Norco,without help, previously she has tried different triptans, including Imitrex, Zomig, Maxalt, without helping,  She has tried and failed preventive medications Topamax, Depakote, Inderal, nortriptyline, Flexeril,  Has tried different abortive treatment, including Imitrex injection, sometimes make her headache worse, Zomig, Maxalt, Imitrex tablets,   Trigger for her headaches are environmental allergy, certain food, such as spicy food, acidic food,  She complains of few years history of bilateral feet parestheisa.increased after bearing weight , reported abnormal EMG nerve conduction study from outside facility in May 2015 consistent with peripheral neuropathy  We have reviewed MRI  of cervical, and lumbar spine at [facility] health in June 2015, there was evidence of multilevel degenerative disc disease, there was no significant canal, or foraminal stenosis  She is now currently taking Nucynta ER 150 mg every day for chronic migraine headache, she also daily Excedrin Migraine for her headaches, up to 4 tablets each day,   Migraine Frequency: daily since 2015, increased frequency and severity since Nucynta ER dosage was increased from 100 to 150mg  dose was increased in June 2017. She has severe headache was associated light, noise sensitivity, had to stay in dark quiet room, have her sunglasses even indoor.  Migraine Last from 5 hours to 12 hours.  UPDATE May 30 2016: We have discussed about Botox injection at her last visit in June 2017, but it never followed through, She reported more than 25 days of migraine in one month, this has been ongoing since last 10 years, She has been on chronic narcotics treatment, recently had major change of her pain medications, she is now taking percocet 10/325mg 4 tablets a day,since February 2018, she is still on long-acting narcotics Embeda50/2 mg 1 capsule daily, he is also taking clonazepam 1 mg twice a day, BuSpar 15 mg twice a day, baclofen 10 mg twice a day, Lyrica 75 mg twice a day,  She lying in the dark trying to help her pain, she end up spending most of her time in bed,she lives with her family, she also smokes, is applying for disability.  She complains 8 out of 10 bilateral frontal area throbbing headache with associated light noise sensitivity, and times with associated nausea, dizziness, lasting hours to up to one week.  Update November 28th 2018 About 6 years ago in 2012, she had multiple Botox injections every 3 months at [place], responded very well, She is now complains  of daily moderate to severe migraine headaches, bilateral frontal, parietal region, at least 6 out of 10, on polypharmacy treatment, Excedrin  Migraine daily for migraine headaches,  Previously she has tried and failed multiple preventive medications, Topamax and Depakote antiepileptic medications Inderal beta blocker Nortriptyline tricyclic antidepressant Flexeril muscle relaxant She has failed Zomig Relpax and Imitrex acutely which are triptans   REVIEW OF SYSTEMS: Full 14 system review of systems performed and notable only for those listed, all others are neg:  Constitutional: neg  Cardiovascular: neg Ear/Nose/Throat: neg  Skin: neg Eyes: neg Respiratory: neg Gastroitestinal: neg  Hematology/Lymphatic: neg  Endocrine: neg Musculoskeletal:neg Allergy/Immunology: neg Neurological: neg Psychiatric: neg Sleep : neg   ALLERGIES: Allergies  Allergen Reactions  . Digoxin And Related Nausea And Vomiting    Elevated 'white count'  . Toradol [Ketorolac Tromethamine] Other (See Comments)    Uncontrollable 'shakes'  . Latex Hives and Swelling  . Other Other (See Comments)    Any nasal sprays cause serious migranes  . Tape Itching and Rash    Please use "paper" tape    HOME MEDICATIONS: Outpatient Medications Prior to Visit  Medication Sig Dispense Refill  . baclofen (LIORESAL) 10 MG tablet Take 10 mg by mouth 2 (two) times daily.    . botulinum toxin Type A (BOTOX) 100 units SOLR injection Inject 200 Units into the muscle every 3 (three) months.    . busPIRone (BUSPAR) 15 MG tablet Take 15 mg by mouth 2 (two) times daily.    Marland Kitchen doxepin (SINEQUAN) 25 MG capsule Take 25 mg by mouth at bedtime.  0  . levonorgestrel (MIRENA) 20 MCG/24HR IUD 1 each by Intrauterine route once.    Marland Kitchen LYRICA 75 MG capsule Take 75 mg by mouth 2 (two) times daily.  0  . oxyCODONE-acetaminophen (PERCOCET) 10-325 MG tablet Take 1 tablet by mouth every 6 (six) hours as needed.  0  . rizatriptan (MAXALT-MLT) 10 MG disintegrating tablet Take 1 tab at onset of migraine.  May repeat in 2 hrs, if needed.  Max dose: 2 tabs/day. This is a 30 day  prescription.May repeat in 2 hours if needed 12 tablet 11   No facility-administered medications prior to visit.     PAST MEDICAL HISTORY: Past Medical History:  Diagnosis Date  . Allergic rhinitis   . Anxiety   . Chiari malformation   . DDD (degenerative disc disease), cervical   . Degenerative disc disease, lumbar   . Depression   . Fibromyalgia   . Hyperlipemia   . Migraine   . Scoliosis   . WPW (Wolff-Parkinson-White syndrome)     PAST SURGICAL HISTORY: Past Surgical History:  Procedure Laterality Date  . CESAREAN SECTION    . CYSTOSCOPY W/ URETERAL STENT PLACEMENT Right 04/25/2015   Procedure: CYSTOSCOPY WITH RETROGRADE PYELOGRAM/URETERAL STENT PLACEMENT;  Surgeon: Ihor Gully, MD;  Location: [facility];  Service: Urology;  Laterality: Right;  . ears Bilateral   . EYE SURGERY Bilateral   . herneated bowel repair    . HERNIA REPAIR    . HOLMIUM LASER APPLICATION Right 04/25/2015   Procedure: HOLMIUM LASER APPLICATION;  Surgeon: Ihor Gully, MD;  Location: [facility];  Service: Urology;  Laterality: Right;  . KNEE SURGERY Bilateral    Right Knee x 1 - Left x3  . TUBAL LIGATION    . vein removed Left    arm    FAMILY HISTORY: Family History  Problem Relation Age of Onset  . Migraines Mother   .  CAD Father   . Migraines Daughter     SOCIAL HISTORY: Social History   Socioeconomic History  . Marital status: Divorced    Spouse name: Not on file  . Number of children: 2  . Years of education: 4912  . Highest education level: Not on file  Social Needs  . Financial resource strain: Not on file  . Food insecurity - worry: Not on file  . Food insecurity - inability: Not on file  . Transportation needs - medical: Not on file  . Transportation needs - non-medical: Not on file  Occupational History    Comment: Not working  Tobacco Use  . Smoking status: Current Every Day Smoker    Packs/day: 0.25    Types: Cigarettes  . Smokeless tobacco: Never Used  Substance and  Sexual Activity  . Alcohol use: No    Alcohol/week: 0.0 oz  . Drug use: No  . Sexual activity: Not on file  Other Topics Concern  . Not on file  Social History Narrative   Patient lives at home with her two children and her mother.   Unemployed. - Patient trying to get social security.   Education high school.   Right handed.   Caffeine soda's four soda's daily.     PHYSICAL EXAM  There were no vitals filed for this visit. There is no height or weight on file to calculate BMI.  Generalized: Well developed, in no acute distress  Head: normocephalic and atraumatic,. Oropharynx benign  Neck: Supple, no carotid bruits  Cardiac: Regular rate rhythm, no murmur  Musculoskeletal: No deformity   Neurological examination   Mentation: Alert oriented to time, place, history taking. Attention span and concentration appropriate. Recent and remote memory intact.  Follows all commands speech and language fluent.   Cranial nerve II-XII: Fundoscopic exam reveals sharp disc margins.Pupils were equal round reactive to light extraocular movements were full, visual field were full on confrontational test. Facial sensation and strength were normal. hearing was intact to finger rubbing bilaterally. Uvula tongue midline. head turning and shoulder shrug were normal and symmetric.Tongue protrusion into cheek strength was normal. Motor: normal bulk and tone, full strength in the BUE, BLE, fine finger movements normal, no pronator drift. No focal weakness Sensory: normal and symmetric to light touch, pinprick, and  Vibration, proprioception  Coordination: finger-nose-finger, heel-to-shin bilaterally, no dysmetria Reflexes: Brachioradialis 2/2, biceps 2/2, triceps 2/2, patellar 2/2, Achilles 2/2, plantar responses were flexor bilaterally. Gait and Station: Rising up from seated position without assistance, normal stance,  moderate stride, good arm swing, smooth turning, able to perform tiptoe, and heel walking  without difficulty. Tandem gait is steady  DIAGNOSTIC DATA (LABS, IMAGING, TESTING) - I reviewed patient records, labs, notes, testing and imaging myself where available.  Lab Results  Component Value Date   WBC 10.1 07/17/2015   HGB 14.0 07/17/2015   HCT 42.9 07/17/2015   MCV 89.2 07/17/2015   PLT 274 07/17/2015      Component Value Date/Time   NA 139 07/17/2015 1934   K 3.1 (L) 07/17/2015 1934   CL 108 07/17/2015 1934   CO2 21 (L) 07/17/2015 1934   GLUCOSE 117 (H) 07/17/2015 1934   BUN 7 07/17/2015 1934   CREATININE 0.74 07/17/2015 1934   CALCIUM 9.3 07/17/2015 1934   PROT 7.8 07/17/2015 1934   ALBUMIN 4.2 07/17/2015 1934   AST 19 07/17/2015 1934   ALT 19 07/17/2015 1934   ALKPHOS 99 07/17/2015 1934   BILITOT 0.5 07/17/2015  1934   GFRNONAA >60 07/17/2015 1934   GFRAA >60 07/17/2015 1934   No results found for: CHOL, HDL, LDLCALC, LDLDIRECT, TRIG, CHOLHDL No results found for: ZOXW9UHGBA1C No results found for: VITAMINB12 No results found for: TSH  ***  ASSESSMENT AND PLAN  41 y.o. year old female  has a past medical history of Allergic rhinitis, Anxiety, Chiari malformation, DDD (degenerative disc disease), cervical, Degenerative disc disease, lumbar, Depression, Fibromyalgia, Hyperlipemia, Migraine, Scoliosis, and WPW (Wolff-Parkinson-White syndrome). here with ***  41 y.o. year old female   Chronic migraine headaches, Polypharmacy treatment             She has tried and failed multiple preventive medications in the past: 1Topamax and Depakote seizure medications 2Inderal beta blocker 3Nortriptyline tricyclic antidepressant 4Flexeril muscle relaxant She has failed Zomig Relpax and Imitrex acutely which are triptans  BOTOX injection was performed according to protocol by [employer]. 100 units of BOTOX was dissolved into 2 cc NS.  Brooke [name], [facility] Surgery CenterGNP, [facility] CenterBC, APRN  [facility] [address], [address] [place], [place] [address] [phone]

## 2017-02-16 ENCOUNTER — Encounter (HOSPITAL_COMMUNITY): Payer: Self-pay

## 2017-02-16 ENCOUNTER — Emergency Department (HOSPITAL_COMMUNITY)
Admission: EM | Admit: 2017-02-16 | Discharge: 2017-02-16 | Disposition: A | Discharge status: Home / Self Care | Payer: Medicaid Other | Attending: Emergency Medicine | Admitting: Emergency Medicine

## 2017-02-16 ENCOUNTER — Ambulatory Visit: Payer: Self-pay | Admitting: Nurse Practitioner

## 2017-02-16 DIAGNOSIS — G894 Chronic pain syndrome: Secondary | ICD-10-CM

## 2017-02-16 DIAGNOSIS — Z91040 Latex allergy status: Secondary | ICD-10-CM | POA: Diagnosis not present

## 2017-02-16 DIAGNOSIS — Z79899 Other long term (current) drug therapy: Secondary | ICD-10-CM | POA: Diagnosis not present

## 2017-02-16 DIAGNOSIS — Z760 Encounter for issue of repeat prescription: Secondary | ICD-10-CM | POA: Insufficient documentation

## 2017-02-16 DIAGNOSIS — F17210 Nicotine dependence, cigarettes, uncomplicated: Secondary | ICD-10-CM | POA: Insufficient documentation

## 2017-02-16 MED ORDER — BACLOFEN 10 MG PO TABS: 10.0000 mg | ORAL_TABLET | Freq: Two times a day (BID) | ORAL | 0 refills | Status: DC

## 2017-02-16 MED ORDER — BACLOFEN 10 MG PO TABS
10.0000 mg | ORAL_TABLET | Freq: Once | ORAL | Status: AC
  Administered 2017-02-16: 10 mg via ORAL
  Filled 2017-02-16: qty 1

## 2017-02-16 MED ORDER — PREGABALIN 100 MG PO CAPS
100.0000 mg | ORAL_CAPSULE | Freq: Once | ORAL | Status: AC
  Administered 2017-02-16: 100 mg via ORAL
  Filled 2017-02-16: qty 1

## 2017-02-16 MED ORDER — OXYCODONE-ACETAMINOPHEN 5-325 MG PO TABS
2.0000 | ORAL_TABLET | Freq: Once | ORAL | Status: AC
  Administered 2017-02-16: 2 via ORAL
  Filled 2017-02-16: qty 2

## 2017-02-16 MED ORDER — PREGABALIN 100 MG PO CAPS: 100.0000 mg | ORAL_CAPSULE | Freq: Two times a day (BID) | ORAL | 0 refills | Status: DC

## 2017-02-16 NOTE — ED Notes (Signed)
Dr. Particia NearingHaviland ware patient refusing to sign dc papers

## 2017-02-16 NOTE — ED Provider Notes (Signed)
[facility] Note   CSN: [id number] Arrival date & time: 02/16/17  1441     History   Chief Complaint No chief complaint on file.   HPI Brooke HimHolly [name] is a [age] y.o. female with hx of chronic pain here for pain  management. Patient usually goes to a pain management clinic but was released from the practice. She ran out of her 3 month supply of her pain medications and called her doctor's office today but they were closed this week for the holiday. Patient states that the person she spoke with told her she would have to come to the ED for refill of her pain medications. Patient takes Baclofen 10 mg, Lyrica 100 mg., Oxycodone 10/325mg  and Xtampza ER. She is requesting refill on all of these medications.    HPI  Past Medical History:  Diagnosis Date  . Allergic rhinitis   . Anxiety   . Chiari malformation   . DDD (degenerative disc disease), cervical   . Degenerative disc disease, lumbar   . Depression   . Fibromyalgia   . Hyperlipemia   . Migraine   . Scoliosis   . WPW (Wolff-Parkinson-White syndrome)     Patient Active Problem List   Diagnosis Date Noted  . Chronic migraine 05/30/2016  . Polypharmacy 05/30/2016  . Atrophy of quadriceps femoris muscle 05/26/2016  . Patellar subluxation, left, sequela 05/26/2016  . Status migrainosus 08/21/2015  . Intractable pain 04/26/2015  . Hydronephrosis determined by ultrasound 04/26/2015  . Right flank pain   . Ureteral calculus, right 04/25/2015    Class: Acute  . Kidney stone 04/20/2015  . Obstruction of right ureteropelvic junction due to stone 04/20/2015  . UTI (lower urinary tract infection) 04/20/2015  . Fibromyalgia 04/20/2015  . Chronic pain disorder 04/20/2015  . Depression   . Anxiety   . Intractable chronic migraine without aura and without status migrainosus 03/26/2014  . Migraine 12/24/2013  . Persistent headaches 09/12/2012  . 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 Tobacco  abuse 09/12/2012    Past Surgical History:  Procedure Laterality Date  . CESAREAN SECTION    . CYSTOSCOPY W/ URETERAL STENT PLACEMENT Right 04/25/2015   Procedure: CYSTOSCOPY WITH RETROGRADE PYELOGRAM/URETERAL STENT PLACEMENT;  Surgeon: Ihor GullyMark Ottelin, MD;  Location: [facility];  Service: Urology;  Laterality: Right;  . ears Bilateral   . EYE SURGERY Bilateral   . herneated bowel repair    . HERNIA REPAIR    . HOLMIUM LASER APPLICATION Right 04/25/2015   Procedure: HOLMIUM LASER APPLICATION;  Surgeon: Ihor GullyMark Ottelin, MD;  Location: [facility];  Service: Urology;  Laterality: Right;  . KNEE SURGERY Bilateral    Right Knee x 1 - Left x3  . TUBAL LIGATION    . vein removed Left    arm    OB History    No data available       Home Medications    Prior to Admission medications   Medication Sig Start Date End Date Taking? Authorizing Provider  baclofen (LIORESAL) 10 MG tablet Take 1 tablet (10 mg total) by mouth 2 (two) times daily. 02/16/17   Janne NapoleonNeese, Aarin Sparkman M, NP  botulinum toxin Type A (BOTOX) 100 units SOLR injection Inject 200 Units into the muscle every 3 (three) months.    [provider]  busPIRone (BUSPAR) 15 MG tablet Take 15 mg by mouth 2 (two) times daily.    [provider]  doxepin (SINEQUAN) 25 MG capsule Take 25 mg by  mouth at bedtime. 05/26/16   [provider]  levonorgestrel (MIRENA) 20 MCG/24HR IUD 1 each by Intrauterine route once.    [provider]  oxyCODONE-acetaminophen (PERCOCET) 10-325 MG tablet Take 1 tablet by mouth every 6 (six) hours as needed. 05/27/16   [provider]  pregabalin (LYRICA) 100 MG capsule Take 1 capsule (100 mg total) by mouth 2 (two) times daily. 02/16/17   Janne NapoleonNeese, Chakira Jachim M, NP  rizatriptan (MAXALT-MLT) 10 MG disintegrating tablet Take 1 tab at onset of migraine.  May repeat in 2 hrs, if needed.  Max dose: 2 tabs/day. This is a 30 day prescription.May repeat in 2 hours if needed 01/19/17   Asa LenteSater, Richard A, MD     Family History Family History  Problem Relation Age of Onset  . Migraines Mother   . CAD Father   . Migraines Daughter     Social History Social History   Tobacco Use  . Smoking status: Current Every Day Smoker    Packs/day: 0.25    Types: Cigarettes  . Smokeless tobacco: Never Used  Substance Use Topics  . Alcohol use: No    Alcohol/week: 0.0 oz  . Drug use: No     Allergies   Digoxin and related; Toradol [ketorolac tromethamine]; Latex; Other; and Tape   Review of Systems Review of Systems  Musculoskeletal: Positive for arthralgias.       Chronic pain     Physical Exam Updated Vital Signs BP 115/87 (BP Location: Right Arm)   Pulse 96   Temp 98.1 F (36.7 C) (Oral)   Resp 16   Ht 5\' 4"  (1.626 m)   Wt 90.7 kg (200 lb)   SpO2 99%   BMI 34.33 kg/m   Physical Exam  Constitutional: She is oriented to person, place, and time. She appears well-developed and well-nourished. No distress.  Eyes: EOM are normal.  Neck: Neck supple.  Cardiovascular: Normal rate.  Pulmonary/Chest: Effort normal.  Musculoskeletal:  Patient ambulatory to exam room in the ED without difficulty.  Neurological: She is alert and oriented to person, place, and time. No cranial nerve deficit.  Skin: Skin is warm and dry.  Psychiatric: Her mood appears anxious.  Nursing note and vitals reviewed.    ED Treatments / Results  Labs (all labs ordered are listed, but only abnormal results are displayed) Labs Reviewed - No data to display  Radiology No results found.  Procedures Procedures (including critical care time)  Medications Ordered in ED Medications  pregabalin (LYRICA) capsule 100 mg (100 mg Oral Given 02/16/17 1712)  oxyCODONE-acetaminophen (PERCOCET/ROXICET) 5-325 MG per tablet 2 tablet (2 tablets Oral Given 02/16/17 1711)  baclofen (LIORESAL) tablet 10 mg (10 mg Oral Given 02/16/17 1728)   Patient was given a dose of her medications here in the ED. I discussed  this case with Dr. Particia NearingHaviland and we will not prescribe the Oxycodone 10 mg.   Initial Impression / Assessment and Plan / ED Course  I have reviewed the triage vital signs and the nursing notes. I discussed with the patient that we do not treat chronic pain in the ED and she will need to contact her PCP for pain management. I discussed I will refill her muscle relaxer and will give her enough Lyrica for 5 days.  Patient states she is not leaving the hospital until she gets her pain medication Rx. Patient wants to speak with the doctor in charge. RN spoke with Dr. Particia NearingHaviland regarding the patient's request and refusal  to leave.   Final Clinical Impressions(s) / ED Diagnoses   Final diagnoses:  Medication refill  Chronic pain syndrome    ED Discharge Orders        Ordered    baclofen (LIORESAL) 10 MG tablet  2 times daily     02/16/17 1659    pregabalin (LYRICA) 100 MG capsule  2 times daily     02/16/17 [address] [place], [place] 02/16/17 Lambert Mody, MD 02/16/17 [phone]

## 2017-02-16 NOTE — ED Notes (Signed)
Patient refusing to sign discharge paperwork due to not be prescribed oxycodone. Edp aware

## 2017-02-16 NOTE — ED Notes (Signed)
Patient continues to refuse to sign discharge paperwork, and refusing to leave. EDP, and charge RN aware.

## 2017-02-16 NOTE — ED Notes (Signed)
Patient escorted out of emergency department by security

## 2017-02-16 NOTE — ED Triage Notes (Signed)
Pt presents for prescriptions for her pain medication.  Pt reports she is in between pain clinics and last took her medications yesterday.  Pt reports feeling flushed with headache.

## 2017-02-16 NOTE — Discharge Instructions (Signed)
You will need to contact your doctor for additional pain management.

## 2017-02-23 ENCOUNTER — Encounter (HOSPITAL_COMMUNITY): Payer: Self-pay | Admitting: Emergency Medicine

## 2017-02-23 ENCOUNTER — Emergency Department (HOSPITAL_COMMUNITY)
Admission: EM | Admit: 2017-02-23 | Discharge: 2017-02-24 | Disposition: A | Discharge status: Home / Self Care | Payer: Medicaid Other | Attending: Emergency Medicine | Admitting: Emergency Medicine

## 2017-02-23 ENCOUNTER — Other Ambulatory Visit: Payer: Self-pay

## 2017-02-23 ENCOUNTER — Emergency Department (HOSPITAL_COMMUNITY): Payer: Medicaid Other

## 2017-02-23 DIAGNOSIS — E785 Hyperlipidemia, unspecified: Secondary | ICD-10-CM | POA: Insufficient documentation

## 2017-02-23 DIAGNOSIS — F17210 Nicotine dependence, cigarettes, uncomplicated: Secondary | ICD-10-CM | POA: Diagnosis not present

## 2017-02-23 DIAGNOSIS — J0191 Acute recurrent sinusitis, unspecified: Secondary | ICD-10-CM | POA: Diagnosis not present

## 2017-02-23 DIAGNOSIS — N390 Urinary tract infection, site not specified: Secondary | ICD-10-CM | POA: Diagnosis not present

## 2017-02-23 DIAGNOSIS — I456 Pre-excitation syndrome: Secondary | ICD-10-CM | POA: Insufficient documentation

## 2017-02-23 DIAGNOSIS — Z79899 Other long term (current) drug therapy: Secondary | ICD-10-CM | POA: Insufficient documentation

## 2017-02-23 DIAGNOSIS — Z91040 Latex allergy status: Secondary | ICD-10-CM | POA: Diagnosis not present

## 2017-02-23 DIAGNOSIS — R0602 Shortness of breath: Secondary | ICD-10-CM | POA: Diagnosis present

## 2017-02-23 LAB — CBC WITH DIFFERENTIAL/PLATELET
Basophils Absolute: 0.1 10*3/uL (ref 0.0–0.1)
Basophils Relative: 2 %
Eosinophils Absolute: 0.1 10*3/uL (ref 0.0–0.7)
Eosinophils Relative: 2 %
HEMATOCRIT: 46 % (ref 36.0–46.0)
HEMOGLOBIN: 14.9 g/dL (ref 12.0–15.0)
LYMPHS PCT: 49 %
Lymphs Abs: 3.4 10*3/uL (ref 0.7–4.0)
MCH: 29.4 pg (ref 26.0–34.0)
MCHC: 32.4 g/dL (ref 30.0–36.0)
MCV: 90.7 fL (ref 78.0–100.0)
MONOS PCT: 8 %
Monocytes Absolute: 0.5 10*3/uL (ref 0.1–1.0)
NEUTROS ABS: 2.7 10*3/uL (ref 1.7–7.7)
NEUTROS PCT: 39 %
Platelets: 169 10*3/uL (ref 150–400)
RBC: 5.07 MIL/uL (ref 3.87–5.11)
RDW: 13.3 % (ref 11.5–15.5)
WBC: 6.8 10*3/uL (ref 4.0–10.5)

## 2017-02-23 LAB — URINALYSIS, ROUTINE W REFLEX MICROSCOPIC
BILIRUBIN URINE: NEGATIVE
GLUCOSE, UA: NEGATIVE mg/dL
Ketones, ur: NEGATIVE mg/dL
NITRITE: NEGATIVE
PH: 5 (ref 5.0–8.0)
Protein, ur: 30 mg/dL — AB
Specific Gravity, Urine: 1.033 — ABNORMAL HIGH (ref 1.005–1.030)

## 2017-02-23 LAB — COMPREHENSIVE METABOLIC PANEL
ALBUMIN: 3.9 g/dL (ref 3.5–5.0)
ALT: 47 U/L (ref 14–54)
ANION GAP: 10 (ref 5–15)
AST: 33 U/L (ref 15–41)
Alkaline Phosphatase: 87 U/L (ref 38–126)
BILIRUBIN TOTAL: 0.9 mg/dL (ref 0.3–1.2)
BUN: 9 mg/dL (ref 6–20)
CO2: 19 mmol/L — AB (ref 22–32)
Calcium: 8.8 mg/dL — ABNORMAL LOW (ref 8.9–10.3)
Chloride: 106 mmol/L (ref 101–111)
Creatinine, Ser: 0.75 mg/dL (ref 0.44–1.00)
GFR calc non Af Amer: >60 mL/min (ref >60)
GLUCOSE: 93 mg/dL (ref 65–99)
POTASSIUM: 3.6 mmol/L (ref 3.5–5.1)
SODIUM: 135 mmol/L (ref 135–145)
TOTAL PROTEIN: 8 g/dL (ref 6.5–8.1)

## 2017-02-23 LAB — I-STAT CG4 LACTIC ACID, ED: Lactic Acid, Venous: 1.49 mmol/L (ref 0.5–1.9)

## 2017-02-23 LAB — I-STAT BETA HCG BLOOD, ED (MC, WL, AP ONLY): I-stat hCG, quantitative: <5 m[IU]/mL (ref <5)

## 2017-02-23 MED ORDER — METOCLOPRAMIDE HCL 5 MG/ML IJ SOLN
10.0000 mg | Freq: Once | INTRAMUSCULAR | Status: AC
  Administered 2017-02-24: 10 mg via INTRAMUSCULAR
  Filled 2017-02-23: qty 2

## 2017-02-23 MED ORDER — DIPHENHYDRAMINE HCL 50 MG/ML IJ SOLN
25.0000 mg | Freq: Once | INTRAMUSCULAR | Status: AC
  Administered 2017-02-24: 25 mg via INTRAMUSCULAR
  Filled 2017-02-23: qty 1

## 2017-02-23 MED ORDER — CEFTRIAXONE SODIUM 1 G IJ SOLR
1.0000 g | Freq: Once | INTRAMUSCULAR | Status: AC
  Administered 2017-02-24: 1 g via INTRAMUSCULAR
  Filled 2017-02-23: qty 10

## 2017-02-23 NOTE — ED Triage Notes (Signed)
Pt presents to ED for assessment of 4 days of nasal congestion, cough with brown phlegm, SOB, fevers, malaise, and fatigue.

## 2017-02-23 NOTE — ED Notes (Signed)
Patient complaining of bilateral ear pain, headache, congestion, SOB, coughing, and sneezing. Afebrile at this time.

## 2017-02-23 NOTE — ED Notes (Signed)
Pt also c/o right ear pain and dizziness.

## 2017-02-23 NOTE — ED Provider Notes (Signed)
[facility] EMERGENCY DEPARTMENT Provider Note   CSN: [id number] Arrival date & time: 02/23/17  1430  Time seen 23:35 PM    History   Chief Complaint Chief Complaint  Patient presents with  . Shortness of Breath    HPI Brooke [name] is a 42 y.o. female.  HPI patient states she has not eaten since December 24 because she has no appetite.  She denies nausea or vomiting but states she is having diarrhea that started on December 26.  She is having 1-2 episodes a day of watery yellow diarrhea.  She states she has had fever the past 2 days that is undocumented.  She states she has chills that lasts a couple of hours.  She describes a mild cough but states she has been having sneezing and having brownish green nasal discharge.  She states she has a lot of facial pain and pressure that radiates behind her ears.  She states she feels like the top of her head is "splitting open".  She denies sore throat.  She states she feels short of breath due to the nasal stuffiness and she denies wheezing.  She states her mother was admitted December 24 for a "systemic infection" with unknown source, she was in the hospital from December 24-29.  She was discharged home without antibiotics.  She also states her grandson came to the ED on December 26 with URI symptoms.  Patient states she has had to have a CT scan of her head before I to look for "deep sinus infection".  Patient was seen in the ED on December 27 wanting her chronic pain medications to be refilled.  This was not done and she states she still has not gotten in with a pain clinic yet.  PCP Jackie Plum, MD   Past Medical History:  Diagnosis Date  . Allergic rhinitis   . Anxiety   . Chiari malformation   . DDD (degenerative disc disease), cervical   . DDD (degenerative disc disease), cervical   . Degenerative disc disease, lumbar   . Depression   . Fibromyalgia   . Hyperlipemia   . Migraine   . Scoliosis   . WPW  (Wolff-Parkinson-White syndrome)     Patient Active Problem List   Diagnosis Date Noted  . Chronic migraine 05/30/2016  . Polypharmacy 05/30/2016  . Atrophy of quadriceps femoris muscle 05/26/2016  . Patellar subluxation, left, sequela 05/26/2016  . Status migrainosus 08/21/2015  . Intractable pain 04/26/2015  . Hydronephrosis determined by ultrasound 04/26/2015  . Right flank pain   . Ureteral calculus, right 04/25/2015    Class: Acute  . Kidney stone 04/20/2015  . Obstruction of right ureteropelvic junction due to stone 04/20/2015  . UTI (lower urinary tract infection) 04/20/2015  . Fibromyalgia 04/20/2015  . Chronic pain disorder 04/20/2015  . Depression   . Anxiety   . Intractable chronic migraine without aura and without status migrainosus 03/26/2014  . Migraine 12/24/2013  . Persistent headaches 09/12/2012  . 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 Tobacco abuse 09/12/2012    Past Surgical History:  Procedure Laterality Date  . CESAREAN SECTION    . CYSTOSCOPY W/ URETERAL STENT PLACEMENT Right 04/25/2015   Procedure: CYSTOSCOPY WITH RETROGRADE PYELOGRAM/URETERAL STENT PLACEMENT;  Surgeon: Ihor Gully, MD;  Location: [facility];  Service: Urology;  Laterality: Right;  . ears Bilateral   . EYE SURGERY Bilateral   . herneated bowel repair    . HERNIA REPAIR    . HOLMIUM LASER  APPLICATION Right 04/25/2015   Procedure: HOLMIUM LASER APPLICATION;  Surgeon: Ihor Gully, MD;  Location: [facility];  Service: Urology;  Laterality: Right;  . KNEE SURGERY Bilateral    Right Knee x 1 - Left x3  . TUBAL LIGATION    . vein removed Left    arm    OB History    No data available       Home Medications    Prior to Admission medications   Medication Sig Start Date End Date Taking? Authorizing Provider  amoxicillin (AMOXIL) 500 MG capsule Take 1 capsule (500 mg total) by mouth 3 (three) times daily. 02/24/17   Devoria Albe, MD  baclofen (LIORESAL) 10 MG tablet Take 1 tablet (10 mg total) by mouth 2  (two) times daily. 02/16/17   Janne Napoleon, NP  botulinum toxin Type A (BOTOX) 100 units SOLR injection Inject 200 Units into the muscle every 3 (three) months.    [provider]  busPIRone (BUSPAR) 15 MG tablet Take 15 mg by mouth 2 (two) times daily.    [provider]  doxepin (SINEQUAN) 25 MG capsule Take 25 mg by mouth at bedtime. 05/26/16   [provider]  levonorgestrel (MIRENA) 20 MCG/24HR IUD 1 each by Intrauterine route once.    [provider]  oxyCODONE-acetaminophen (PERCOCET) 10-325 MG tablet Take 1 tablet by mouth every 6 (six) hours as needed. 05/27/16   [provider]  pregabalin (LYRICA) 100 MG capsule Take 1 capsule (100 mg total) by mouth 2 (two) times daily. 02/16/17   Janne Napoleon, NP  rizatriptan (MAXALT-MLT) 10 MG disintegrating tablet Take 1 tab at onset of migraine.  May repeat in 2 hrs, if needed.  Max dose: 2 tabs/day. This is a 30 day prescription.May repeat in 2 hours if needed 01/19/17   Asa Lente, MD    Family History Family History  Problem Relation Age of Onset  . Migraines Mother   . CAD Father   . Migraines Daughter     Social History Social History   Tobacco Use  . Smoking status: Current Every Day Smoker    Packs/day: 0.25    Types: Cigarettes  . Smokeless tobacco: Never Used  Substance Use Topics  . Alcohol use: No    Alcohol/week: 0.0 oz  . Drug use: No  on disability for fibromyalgia, DDD, scoliosis   Allergies   Digoxin and related; Toradol [ketorolac tromethamine]; Latex; Other; and Tape   Review of Systems Review of Systems  All other systems reviewed and are negative.    Physical Exam Updated Vital Signs BP 124/88 (BP Location: Right Arm)   Pulse 100   Temp 99.5 F (37.5 C) (Oral)   Resp 18   SpO2 98%   Vital signs normal except for low-grade temp and borderline tachycardia, during my exam her heart rate was 93   Physical Exam  Constitutional: She is oriented to  person, place, and time. She appears well-developed and well-nourished.  Non-toxic appearance. She does not appear ill. No distress.  HENT:  Head: Normocephalic and atraumatic.    Right Ear: Tympanic membrane, external ear and ear canal normal.  Left Ear: External ear normal.  Nose: Nose normal. No mucosal edema or rhinorrhea.  Mouth/Throat: Oropharynx is clear and moist and mucous membranes are normal. No dental abscesses or uvula swelling.  Left TM is obscured by cerumen Patient have diminished airflow through both nostrils. She is very tender to palpation over both the  frontal and maxillary sinuses and states the maxillary sinuses are the most painful.  Eyes: Conjunctivae and EOM are normal. Pupils are equal, round, and reactive to light.  Neck: Normal range of motion and full passive range of motion without pain. Neck supple.  Cardiovascular: Normal rate, regular rhythm and normal heart sounds. Exam reveals no gallop and no friction rub.  No murmur heard. Pulmonary/Chest: Effort normal and breath sounds normal. No respiratory distress. She has no wheezes. She has no rhonchi. She has no rales. She exhibits no tenderness and no crepitus.  Abdominal: Soft. Normal appearance and bowel sounds are normal. She exhibits no distension. There is no tenderness. There is no rebound and no guarding.  Musculoskeletal: Normal range of motion. She exhibits no edema or tenderness.  Moves all extremities well.   Neurological: She is alert and oriented to person, place, and time. She has normal strength. No cranial nerve deficit.  Skin: Skin is warm, dry and intact. No rash noted. No erythema. No pallor.  Psychiatric: She has a normal mood and affect. Her speech is normal and behavior is normal. Her mood appears not anxious.  Nursing note and vitals reviewed.    ED Treatments / Results  Labs (all labs ordered are listed, but only abnormal results are displayed) Results for orders placed or performed  during the hospital encounter of 02/23/17  Comprehensive metabolic panel  Result Value Ref Range   Sodium 135 135 - 145 mmol/L   Potassium 3.6 3.5 - 5.1 mmol/L   Chloride 106 101 - 111 mmol/L   CO2 19 (L) 22 - 32 mmol/L   Glucose, Bld 93 65 - 99 mg/dL   BUN 9 6 - 20 mg/dL   Creatinine, Ser 4.09 0.44 - 1.00 mg/dL   Calcium 8.8 (L) 8.9 - 10.3 mg/dL   Total Protein 8.0 6.5 - 8.1 g/dL   Albumin 3.9 3.5 - 5.0 g/dL   AST 33 15 - 41 U/L   ALT 47 14 - 54 U/L   Alkaline Phosphatase 87 38 - 126 U/L   Total Bilirubin 0.9 0.3 - 1.2 mg/dL   GFR calc non Af Amer >60 >60 mL/min   GFR calc Af Amer >60 >60 mL/min   Anion gap 10 5 - 15  CBC with Differential  Result Value Ref Range   WBC 6.8 4.0 - 10.5 K/uL   RBC 5.07 3.87 - 5.11 MIL/uL   Hemoglobin 14.9 12.0 - 15.0 g/dL   HCT 81.1 91.4 - 78.2 %   MCV 90.7 78.0 - 100.0 fL   MCH 29.4 26.0 - 34.0 pg   MCHC 32.4 30.0 - 36.0 g/dL   RDW 95.6 21.3 - 08.6 %   Platelets 169 150 - 400 K/uL   Neutrophils Relative % 39 %   Lymphocytes Relative 49 %   Monocytes Relative 8 %   Eosinophils Relative 2 %   Basophils Relative 2 %   Neutro Abs 2.7 1.7 - 7.7 K/uL   Lymphs Abs 3.4 0.7 - 4.0 K/uL   Monocytes Absolute 0.5 0.1 - 1.0 K/uL   Eosinophils Absolute 0.1 0.0 - 0.7 K/uL   Basophils Absolute 0.1 0.0 - 0.1 K/uL   WBC Morphology ATYPICAL LYMPHOCYTES   Urinalysis, Routine w reflex microscopic  Result Value Ref Range   Color, Urine AMBER (A) YELLOW   APPearance CLOUDY (A) CLEAR   Specific Gravity, Urine 1.033 (H) 1.005 - 1.030   pH 5.0 5.0 - 8.0   Glucose, UA NEGATIVE NEGATIVE  mg/dL   Hgb urine dipstick SMALL (A) NEGATIVE   Bilirubin Urine NEGATIVE NEGATIVE   Ketones, ur NEGATIVE NEGATIVE mg/dL   Protein, ur 30 (A) NEGATIVE mg/dL   Nitrite NEGATIVE NEGATIVE   Leukocytes, UA MODERATE (A) NEGATIVE   RBC / HPF 0-5 0 - 5 RBC/hpf   WBC, UA TOO NUMEROUS TO COUNT 0 - 5 WBC/hpf   Bacteria, UA MANY (A) NONE SEEN   Squamous Epithelial / LPF TOO NUMEROUS  TO COUNT (A) NONE SEEN   Mucus PRESENT    Hyaline Casts, UA PRESENT    Non Squamous Epithelial 0-5 (A) NONE SEEN  I-Stat CG4 Lactic Acid, ED  Result Value Ref Range   Lactic Acid, Venous 1.49 0.5 - 1.9 mmol/L  I-Stat beta hCG blood, ED  Result Value Ref Range   I-stat hCG, quantitative <5.0 <5 mIU/mL   Comment 3           Laboratory interpretation all normal except possible UTI, urine was obtained to long ago to get a culture from this sample although she also has too numerous to count squamous cells it could just be contamination, white blood cell count is normal which is consistent with viral illness    EKG  EKG Interpretation None       EKG Interpretation  Date/Time:    Ventricular Rate: 115   PR Interval:    QRS Duration:   QT Interval:    QTC Calculation:   R Axis:     Text Interpretation: NS STTWC Since last tracing 12 Jun 2015 T wave inversion is less evident in inferolateral leads            Radiology Dg Chest 2 View  Result Date: 02/23/2017 CLINICAL DATA:  Four days of nasal congestion, cough, shortness breath, fevers EXAM: CHEST  2 VIEW COMPARISON:  10/14/2014 FINDINGS: The heart size and mediastinal contours are within normal limits. Both lungs are clear. The visualized skeletal structures are unremarkable. IMPRESSION: No active cardiopulmonary disease. Electronically Signed   By: Elige Ko   On: 02/23/2017 16:02    Procedures Procedures (including critical care time)  Medications Ordered in ED Medications  cefTRIAXone (ROCEPHIN) injection 1 g (not administered)  metoCLOPramide (REGLAN) injection 10 mg (not administered)  diphenhydrAMINE (BENADRYL) injection 25 mg (not administered)     Initial Impression / Assessment and Plan / ED Course  I have reviewed the triage vital signs and the nursing notes.  Pertinent labs & imaging results that were available during my care of the patient were reviewed by me and considered in my medical decision  making (see chart for details).    We discussed doing IV fluids however patient states she is very difficult IV stick and she "always has to have it started in the neck".  Since she is not vomiting I feel like she can drink fluids on her own.  She was given Rocephin IM for possible sinus infection and UTI.  She was given Reglan and Benadryl IM for her complaints of headache.  I suspect some of her symptoms may be from her narcotic withdrawal.  Patient has had several head CTs done since 2014 that on my brief review were all without acute abnormality.  Final Clinical Impressions(s) / ED Diagnoses   Final diagnoses:  Acute recurrent sinusitis, unspecified location  Urinary tract infection without hematuria, site unspecified    ED Discharge Orders        Ordered    amoxicillin (AMOXIL) 500 MG capsule  3 times daily     02/24/17 0005    OTC meds  Plan discharge  Devoria AlbeIva Harleyquinn Gasser, MD, [facility] PyoFACEP    Shanyiah Conde, MD 02/24/17 [phone]

## 2017-02-24 MED ORDER — STERILE WATER FOR INJECTION IJ SOLN
INTRAMUSCULAR | Status: AC
  Filled 2017-02-24: qty 10

## 2017-02-24 MED ORDER — AMOXICILLIN 500 MG PO CAPS: 500.0000 mg | ORAL_CAPSULE | Freq: Three times a day (TID) | ORAL | 0 refills | Status: DC

## 2017-02-24 NOTE — Discharge Instructions (Signed)
Use heat on your face for comfort. You can use nasacort nasal spray OTC in your nose to help with the swelling which will help with your breathing and will help your sinuses drain which will help the infection get better faster. You can use saline nose drops for comfort. You can take sudafed OTC for nasal stuffiness for a few days then stop. Take the antibiotics until gone.  Recheck if you get worse or aren't improving over the next 3-4 days.

## 2017-04-26 ENCOUNTER — Encounter (INDEPENDENT_AMBULATORY_CARE_PROVIDER_SITE_OTHER): Payer: Self-pay

## 2017-04-26 ENCOUNTER — Ambulatory Visit (INDEPENDENT_AMBULATORY_CARE_PROVIDER_SITE_OTHER): Payer: Medicaid Other | Admitting: Neurology

## 2017-04-26 ENCOUNTER — Encounter: Payer: Self-pay | Admitting: Neurology

## 2017-04-26 VITALS — BP 122/70 | Ht 64.0 in | Wt 201.0 lb

## 2017-04-26 DIAGNOSIS — G43719 Chronic migraine without aura, intractable, without status migrainosus: Secondary | ICD-10-CM | POA: Diagnosis not present

## 2017-04-26 MED ORDER — SUMATRIPTAN SUCCINATE 6 MG/0.5ML ~~LOC~~ SOLN: 6.0000 mg | SUBCUTANEOUS | 11 refills | Status: DC | PRN

## 2017-04-26 MED ORDER — ONDANSETRON 4 MG PO TBDP: 4.0000 mg | ORAL_TABLET | Freq: Three times a day (TID) | ORAL | 6 refills | Status: DC | PRN

## 2017-04-26 NOTE — Progress Notes (Signed)
Botox-100unitsx2 vials Lot: [id number] Expiration: 06/2019 NDC: [id number]  Dx: U44.034G43.719  Specialty Pharmacy

## 2017-04-26 NOTE — Progress Notes (Signed)
[facility]  PATIENT: Brooke [name] DOB: 03/12/1975   REASON FOR VISIT: Follow-up for headaches  HISTORY FROM: Patient  HISTORY OF PRESENT ILLNESS: Brooke [name] a 42 year old right-handed female, referred by her primary care physician Dr. Julio Sickssei-Bonsu for evaluation of diffuse body achy pain, migraine  She carries a diagnosis of chronic migraine since 1983,, fibromyalgia since 2011, she complains of chronic neck pain, low back pain, joints pain, radiating pain to her left arm,  She moved from [place] to [place] in November 2013, has been under the [facility] pain management. Over the years, she has tried different medications,   Previously she has tried and failed Topamax, trigger point injection, which actually made her headache worse while she was in [place], she reported frequent emergency room visit, 3 times each month, receiving Dilaudid, She was also treated with Opana, fentanyl patch, Demerol in the past. she is now taking Norco,from her pain management  She reported long-standing history of migraine, her typical migraine are right retro-orbital area severe pounding headache with associated light noise sensitivity, resting helps, lasting few hours,  She has tried Fioricet, Flexeril, Norco,without help, previously she has tried different triptans, including Imitrex, Zomig, Maxalt, without helping,  She has tried and failed preventive medications Topamax, Depakote, Inderal, nortriptyline, Flexeril,  Has tried different abortive treatment, including Imitrex injection, sometimes make her headache worse, Zomig, Maxalt, Imitrex tablets,   Trigger for her headaches are environmental allergy, certain food, such as spicy food, acidic food,  She complains of few years history of bilateral feet parestheisa.increased after bearing weight , reported abnormal EMG nerve conduction study from outside facility in May 2015 consistent with peripheral  neuropathy  We have reviewed [facility] cervical[facility], there was evidence of multilevel degenerative disc disease, there was no significant canal, or foraminal stenosis  She is now currently taking Nucynta ER 150 mg every day for chronic migraine headache, she also daily Excedrin Migraine for her headaches, up to 4 tablets each day,   Migraine Frequency: daily since 2015, increased frequency and severity since Nucynta ER dosage was increased from 100 to 150mg  dose was increased in June 2017. She has severe headache was associated light, noise sensitivity, had to stay in dark quiet room, have her sunglasses even indoor.  Migraine Last from 5 hours to 12 hours.  UPDATE May 30 2016: We have discussed about Botox injection at her last visit in June 2017, but it never followed through, She reported more than 25 days of migraine in one month, this has been ongoing since last 10 years, She has been on chronic narcotics treatment, recently had major change of her pain medications, she is now taking percocet 10/325mg  4 tablets a day,since February 2018, she is still on long-acting narcotics Embeda 50/2 mg 1 capsule daily, he is also taking clonazepam 1 mg twice a day, BuSpar 15 mg twice a day, baclofen 10 mg twice a day, Lyrica 75 mg twice a day,  She lying in the dark trying to help her pain, she end up spending most of her time in bed, she lives with her family, she also smokes, is applying for disability.  She complains 8 out of 10 bilateral frontal area throbbing headache with associated light noise sensitivity, and times with associated nausea, dizziness, lasting hours to up to one week.  Update November 28th 2018 About 6 years ago in 2012, she had multiple Botox injections every 3 months at [place], responded  very well, She is now complains of daily moderate to severe migraine headaches, bilateral frontal, parietal region, at least 6 out of 10, on  polypharmacy treatment, Excedrin Migraine daily for migraine headaches,  Previously she has tried and failed multiple preventive medications, Topamax and Depakote antiepileptic medications Inderal beta blocker Nortriptyline tricyclic antidepressant Flexeril muscle relaxant She has failed Zomig Relpax and Imitrex acutely which are triptans  UPDATE March 6th 2019: She has migraine 1-2 each week, reported 60% improvement with Botox injection, her migraine responding to triptan better now,   REVIEW OF SYSTEMS: Full 14 system review of systems performed and notable only for those listed, all others are neg:    ALLERGIES: Allergies  Allergen Reactions  . Digoxin And Related Nausea And Vomiting    Elevated 'white count'  . Toradol [Ketorolac Tromethamine] Other (See Comments)    Uncontrollable 'shakes'  . Latex Hives and Swelling  . Other Other (See Comments)    Any nasal sprays cause serious migranes  . Tape Itching and Rash    Please use "paper" tape    HOME MEDICATIONS: Outpatient Medications Prior to Visit  Medication Sig Dispense Refill  . amoxicillin (AMOXIL) 500 MG capsule Take 1 capsule (500 mg total) by mouth 3 (three) times daily. 30 capsule 0  . baclofen (LIORESAL) 10 MG tablet Take 1 tablet (10 mg total) by mouth 2 (two) times daily. 20 each 0  . botulinum toxin Type A (BOTOX) 100 units SOLR injection Inject 200 Units into the muscle every 3 (three) months.    . busPIRone (BUSPAR) 15 MG tablet Take 15 mg by mouth 2 (two) times daily.    Marland Kitchen. doxepin (SINEQUAN) 25 MG capsule Take 25 mg by mouth at bedtime.  0  . levonorgestrel (MIRENA) 20 MCG/24HR IUD 1 each by Intrauterine route once.    Marland Kitchen. oxyCODONE-acetaminophen (PERCOCET) 10-325 MG tablet Take 1 tablet by mouth every 6 (six) hours as needed.  0  . pregabalin (LYRICA) 100 MG capsule Take 1 capsule (100 mg total) by mouth 2 (two) times daily. 10 capsule 0  . rizatriptan (MAXALT-MLT) 10 MG disintegrating tablet Take 1 tab at  onset of migraine.  May repeat in 2 hrs, if needed.  Max dose: 2 tabs/day. This is a 30 day prescription.May repeat in 2 hours if needed 12 tablet 11   No facility-administered medications prior to visit.     PAST MEDICAL HISTORY: Past Medical History:  Diagnosis Date  . Allergic rhinitis   . Anxiety   . Chiari malformation   . DDD (degenerative disc disease), cervical   . DDD (degenerative disc disease), cervical   . Degenerative disc disease, lumbar   . Depression   . Fibromyalgia   . Hyperlipemia   . Migraine   . Scoliosis   . WPW (Wolff-Parkinson-White syndrome)     PAST SURGICAL HISTORY: Past Surgical History:  Procedure Laterality Date  . CESAREAN SECTION    . CYSTOSCOPY W/ URETERAL STENT PLACEMENT Right 04/25/2015   Procedure: CYSTOSCOPY WITH RETROGRADE PYELOGRAM/URETERAL STENT PLACEMENT;  Surgeon: Ihor GullyMark Ottelin, MD;  Location: [facility];  Service: Urology;  Laterality: Right;  . ears Bilateral   . EYE SURGERY Bilateral   . herneated bowel repair    . HERNIA REPAIR    . HOLMIUM LASER APPLICATION Right 04/25/2015   Procedure: HOLMIUM LASER APPLICATION;  Surgeon: Ihor GullyMark Ottelin, MD;  Location: [facility];  Service: Urology;  Laterality: Right;  . KNEE SURGERY Bilateral    Right Knee x 1 -  Left x3  . TUBAL LIGATION    . vein removed Left    arm    FAMILY HISTORY: Family History  Problem Relation Age of Onset  . Migraines Mother   . CAD Father   . Migraines Daughter     SOCIAL HISTORY: Social History   Socioeconomic History  . Marital status: Divorced    Spouse name: Not on file  . Number of children: 2  . Years of education: 61  . Highest education level: Not on file  Social Needs  . Financial resource strain: Not on file  . Food insecurity - worry: Not on file  . Food insecurity - inability: Not on file  . Transportation needs - medical: Not on file  . Transportation needs - non-medical: Not on file  Occupational History    Comment: Not working  Tobacco Use    . Smoking status: Current Every Day Smoker    Packs/day: 0.25    Types: Cigarettes  . Smokeless tobacco: Never Used  Substance and Sexual Activity  . Alcohol use: No    Alcohol/week: 0.0 oz  . Drug use: No  . Sexual activity: Not on file  Other Topics Concern  . Not on file  Social History Narrative   Patient lives at home with her two children and her mother.   Unemployed. - Patient trying to get social security.   Education high school.   Right handed.   Caffeine soda's four soda's daily.     PHYSICAL EXAM  Vitals:   04/26/17 1519  BP: 122/70  Weight: 201 lb (91.2 kg)  Height: 5\' 4"  (1.626 m)   Body mass index is 34.5 kg/m.  Generalized: Well developed, obese female in no acute distress  Head: normocephalic and atraumatic,. Oropharynx benign  Neck: Supple, no carotid bruits  Cardiac: Regular rate rhythm, no murmur  Musculoskeletal: No deformity   Neurological examination   Mentation: Alert oriented to time, place, history taking. Attention span and concentration appropriate. Recent and remote memory intact.  Follows all commands speech and language fluent.   Cranial nerve II-XII: Fundoscopic exam not done Pupils were equal round reactive to light extraocular movements were full, visual field were full on confrontational test. Facial sensation and strength were normal. hearing was intact to finger rubbing bilaterally. Uvula tongue midline. head turning and shoulder shrug were normal and symmetric.Tongue protrusion into cheek strength was normal. Motor: normal bulk and tone, full strength in the BUE, BLE, fine finger movements normal, no pronator drift. No focal weakness Sensory: normal and symmetric to light touch, pinprick, and  Vibration, in the upper and lower extremities Coordination: finger-nose-finger, heel-to-shin bilaterally, no dysmetria Reflexes: Brachioradialis 2/2, biceps 2/2, triceps 2/2, patellar 2/2, Achilles 2/2, plantar responses were flexor  bilaterally. Gait and Station: Rising up from seated position without assistance, normal stance,  moderate stride, good arm swing, smooth turning, able to perform tiptoe, and heel walking without difficulty. Tandem gait is steady  DIAGNOSTIC DATA (LABS, IMAGING, TESTING) - I reviewed patient records, labs, notes, testing and imaging myself where available.  Lab Results  Component Value Date   WBC 6.8 02/23/2017   HGB 14.9 02/23/2017   HCT 46.0 02/23/2017   MCV 90.7 02/23/2017   PLT 169 02/23/2017      Component Value Date/Time   NA 135 02/23/2017 1504   K 3.6 02/23/2017 1504   CL 106 02/23/2017 1504   CO2 19 (L) 02/23/2017 1504   GLUCOSE 93 02/23/2017 1504   BUN  9 02/23/2017 1504   CREATININE 0.75 02/23/2017 1504   CALCIUM 8.8 (L) 02/23/2017 1504   PROT 8.0 02/23/2017 1504   ALBUMIN 3.9 02/23/2017 1504   AST 33 02/23/2017 1504   ALT 47 02/23/2017 1504   ALKPHOS 87 02/23/2017 1504   BILITOT 0.9 02/23/2017 1504   GFRNONAA >60 02/23/2017 1504   GFRAA >60 02/23/2017 1504    ASSESSMENT AND PLAN  42 y.o. year old female   Chronic migraine headaches, Polypharmacy treatment  She has tried and failed multiple preventive medications in the past: 1Topamax and Depakote seizure medications 2Inderal beta blocker 3Nortriptyline tricyclic antidepressant 4Flexeril muscle relaxant She has failed Zomig Relpax and Imitrex acutely which are triptans  BOTOX injection was performed according to protocol by [facility]. 100 units of BOTOX was dissolved into 2 cc NS.        Patient tolerate the injection well. Will return for repeat injection in 3 months.  Extra 45 units were injected into left parietal, upper cervical paraspinals.  Levert Feinstein, M.D. Ph.D.  [facility] [address] [place], [place] [address] Phone: [phone] Fax:      [phone]

## 2017-04-28 ENCOUNTER — Telehealth: Payer: Self-pay

## 2017-04-28 NOTE — Telephone Encounter (Signed)
Completed pa for sumatriptan inj with medicaid. PA sent to pharmacist for review. May call in 24 hours for determination. PA # [id number].

## 2017-05-01 NOTE — Telephone Encounter (Signed)
PA for sumatriptan approved by [place] Medicaid until 04/23/2018.

## 2017-05-02 ENCOUNTER — Telehealth: Payer: Self-pay | Admitting: Neurology

## 2017-05-02 NOTE — Telephone Encounter (Signed)
Pt called she is still experiencing migraine daily. When she moves her head she hears a cracking and crunching at the base of the head, neck. Pt said she has chari-malformation and is wondering if it is playing a part in the migraine. Please call to advise.

## 2017-05-02 NOTE — Telephone Encounter (Addendum)
[place] to [place] - reports migraine since 04/25/17 with only temporary relief with repeat injections of sumatriptan.  Unfortunately, she called just before our office closes asking for treatment here.  I will discuss orders with Dr. Terrace ArabiaYan and will call the patient back in the morning. We can work her into the schedule. The patient is agreeable to this plan and will repeat her home meds to control pain.

## 2017-05-03 NOTE — Telephone Encounter (Addendum)
Per vo by Dr. Terrace ArabiaYan, patient can come in for the following IV medications:  1) VPA 1gram 2) Compazine 10mg   Please note that patient is allergic to tape and Latex.  Additionally, she is allergic to ketorolac which is the reason it was not ordered.  She is agreeable to this plan and will be at our office today, with a driver, at [id number].  Dr. Terrace ArabiaYan has signed orders and [facility].

## 2017-05-25 ENCOUNTER — Telehealth: Payer: Self-pay | Admitting: Neurology

## 2017-05-25 NOTE — Telephone Encounter (Signed)
Pt states she has had a migraine for over a week and has tried all her medications.  Pt would like to know if she can come in for an infusion.  Please call

## 2017-05-25 NOTE — Telephone Encounter (Addendum)
Per vo by Dr. Terrace ArabiaYan, patient can come in for the following IV medications:  1) VPA 1gram 2) Compazine 10mg   Please note that patient is allergic to tape and Latex.  Additionally, she is allergic to ketorolac which is the reason it was not ordered.  She is agreeable to this plan and will be at our office today, with a driver, at 29FA10am.  Dr. Terrace ArabiaYan has signed orders and [facility].

## 2017-06-27 ENCOUNTER — Telehealth: Payer: Self-pay | Admitting: *Deleted

## 2017-06-27 NOTE — Telephone Encounter (Signed)
Pt states she has had a migraine for about 4 days and has tried all her medications. Pt would like to come in for an infusion tomorrow if possible, please call to advise.  

## 2017-06-27 NOTE — Telephone Encounter (Signed)
Patient is aware this request will be discussed in the morning with Dr. Terrace Arabia.  Patient is agreeable to this plan and will use home meds tonight.

## 2017-06-27 NOTE — Telephone Encounter (Signed)
Pt states she has had a migraine for about 4 days and has tried all her medications. Pt would like to come in for an infusion tomorrow if possible, please call to advise.

## 2017-06-27 NOTE — Telephone Encounter (Signed)
Last infusions:  05/02/17, 05/25/17  1) VPA 1gram 2) Compazine   Please note that patient is allergic to tapeand Latex. Additionally, she is allergic to ketorolac.

## 2017-06-27 NOTE — Telephone Encounter (Signed)
See new telephone note started on 06/27/17.

## 2017-06-28 NOTE — Telephone Encounter (Signed)
Dr. Terrace Arabia has authorized a repeat infusion of VPA 1gram and compazine .  Signed order provided in [facility].  The patient has been scheduled at 1pm and will have a driver with her.

## 2017-07-26 ENCOUNTER — Telehealth: Payer: Self-pay | Admitting: Neurology

## 2017-07-26 NOTE — Telephone Encounter (Signed)
I called to schedule shipment on 07/27/17. DW

## 2017-08-03 ENCOUNTER — Encounter: Payer: Self-pay | Admitting: Neurology

## 2017-08-03 ENCOUNTER — Ambulatory Visit: Payer: Medicaid Other | Admitting: Neurology

## 2017-08-03 VITALS — BP 123/89 | HR 81 | Ht 64.0 in | Wt 188.0 lb

## 2017-08-03 DIAGNOSIS — G43909 Migraine, unspecified, not intractable, without status migrainosus: Secondary | ICD-10-CM | POA: Diagnosis not present

## 2017-08-03 MED ORDER — FREMANEZUMAB-VFRM 225 MG/1.5ML ~~LOC~~ SOSY: 225.0000 mg | PREFILLED_SYRINGE | SUBCUTANEOUS | 11 refills | Status: DC

## 2017-08-03 NOTE — Progress Notes (Signed)
**  Botox 100 units x 2 vials, NDC 0023-1145-01, Lot [id number], Exp 11/2019, specialty pharmacy.//mck,rn** 

## 2017-08-03 NOTE — Progress Notes (Signed)
[facility]  PATIENT: Brooke [name] DOB: 03/12/1975   REASON FOR VISIT: Follow-up for headaches  HISTORY FROM: Patient  HISTORY OF PRESENT ILLNESS: Brooke [name] a 42 year old right-handed [name], referred by her primary care physician Dr. Julio Sickssei-Bonsu for evaluation of diffuse body achy pain, migraine  She carries a diagnosis of chronic migraine since 1983,, fibromyalgia since 2011, she complains of chronic neck pain, low back pain, joints pain, radiating pain to her left arm,  She moved from [place] to [place] in November 2013, has been under the [facility] pain management. Over the years, she has tried different medications,   Previously she has tried and failed Topamax, trigger point injection, which actually made her headache worse while she was in [place], she reported frequent emergency room visit, 3 times each month, receiving Dilaudid, She was also treated with Opana, fentanyl patch, Demerol in the past. she is now taking Norco,from her pain management  She reported long-standing history of migraine, her typical migraine are right retro-orbital area severe pounding headache with associated light noise sensitivity, resting helps, lasting few hours,  She has tried Fioricet, Flexeril, Norco,without help, previously she has tried different triptans, including Imitrex, Zomig, Maxalt, without helping,  She has tried and failed preventive medications Topamax, Depakote, Inderal, nortriptyline, Flexeril,  Has tried different abortive treatment, including Imitrex injection, sometimes make her headache worse, Zomig, Maxalt, Imitrex tablets,   Trigger for her headaches are environmental allergy, certain food, such as spicy food, acidic food,  She complains of few years history of bilateral feet parestheisa.increased after bearing weight , reported abnormal EMG nerve conduction study from outside facility in May 2015 consistent with peripheral  neuropathy  We have reviewed [facility] cervical[facility] and [facility], there was evidence of multilevel degenerative disc disease, there was no significant canal, or foraminal stenosis  She is now currently taking Nucynta ER 150 mg every day for chronic migraine headache, she also daily Excedrin Migraine for her headaches, up to 4 tablets each day,   Migraine Frequency: daily since 2015, increased frequency and severity since Nucynta ER dosage was increased from 100 to 150mg  dose was increased in June 2017. She has severe headache was associated light, noise sensitivity, had to stay in dark quiet room, have her sunglasses even indoor.  Migraine Last from 5 hours to 12 hours.  UPDATE May 30 2016: We have discussed about Botox injection at her last visit in June 2017, but it never followed through, She reported more than 25 days of migraine in one month, this has been ongoing since last 10 years, She has been on chronic narcotics treatment, recently had major change of her pain medications, she is now taking percocet 10/325mg  4 tablets a day,since February 2018, she is still on long-acting narcotics Embeda 50/2 mg 1 capsule daily, he is also taking clonazepam 1 mg twice a day, BuSpar 15 mg twice a day, baclofen 10 mg twice a day, Lyrica 75 mg twice a day,  She lying in the dark trying to help her pain, she end up spending most of her time in bed, she lives with her family, she also smokes, is applying for disability.  She complains 8 out of 10 bilateral frontal area throbbing headache with associated light noise sensitivity, and times with associated nausea, dizziness, lasting hours to up to one week.  Update November 28th 2018 About 6 years ago in 2012, she had multiple Botox injections every 3 months at [place], responded  very well, She is now complains of daily moderate to severe migraine headaches, bilateral frontal, parietal region, at least 6 out of 10, on  polypharmacy treatment, Excedrin Migraine daily for migraine headaches,  Previously she has tried and failed multiple preventive medications, Topamax and Depakote antiepileptic medications Inderal beta blocker Nortriptyline tricyclic antidepressant Flexeril muscle relaxant She has failed Zomig Relpax and Imitrex acutely which are triptans  UPDATE August 03 2017: She complains 25/30 days of headaches, has to come to the office multiple times for iv infusion, report 10% improvement with BOTOX injection.  REVIEW OF SYSTEMS: Full 14 system review of systems performed and notable only for those listed, all others are neg: as above.   ALLERGIES: Allergies  Allergen Reactions  . Digoxin And Related Nausea And Vomiting    Elevated 'white count'  . Toradol [Ketorolac Tromethamine] Other (See Comments)    Uncontrollable 'shakes'  . Latex Hives and Swelling  . Other Other (See Comments)    Any nasal sprays cause serious migranes  . Tape Itching and Rash    Please use "paper" tape    HOME MEDICATIONS: Outpatient Medications Prior to Visit  Medication Sig Dispense Refill  . baclofen (LIORESAL) 10 MG tablet Take 1 tablet (10 mg total) by mouth 2 (two) times daily. 20 each 0  . botulinum toxin Type A (BOTOX) 100 units SOLR injection Inject 200 Units into the muscle every 3 (three) months.    . busPIRone (BUSPAR) 15 MG tablet Take 15 mg by mouth 2 (two) times daily.    Marland Kitchen levonorgestrel (MIRENA) 20 MCG/24HR IUD 1 each by Intrauterine route once.    . ondansetron (ZOFRAN ODT) 4 MG disintegrating tablet Take 1 tablet (4 mg total) by mouth every 8 (eight) hours as needed. 20 tablet 6  . Oxycodone HCl 10 MG TABS Take one tablet five times daily.    . pregabalin (LYRICA) 100 MG capsule Take 1 capsule (100 mg total) by mouth 2 (two) times daily. 10 capsule 0  . SUMAtriptan (IMITREX) 6 MG/0.5ML SOLN injection Inject 0.5 mLs (6 mg total) into the skin every 2 (two) hours as needed for migraine or  headache. May repeat in 2 hours if headache persists or recurs. 12 vial 11  . amoxicillin (AMOXIL) 500 MG capsule Take 1 capsule (500 mg total) by mouth 3 (three) times daily. 30 capsule 0  . doxepin (SINEQUAN) 25 MG capsule Take 25 mg by mouth at bedtime.  0  . oxyCODONE-acetaminophen (PERCOCET) 10-325 MG tablet Take 1 tablet by mouth every 6 (six) hours as needed.  0  . rizatriptan (MAXALT-MLT) 10 MG disintegrating tablet Take 1 tab at onset of migraine.  May repeat in 2 hrs, if needed.  Max dose: 2 tabs/day. This is a 30 day prescription.May repeat in 2 hours if needed 12 tablet 11   No facility-administered medications prior to visit.     PAST MEDICAL HISTORY: Past Medical History:  Diagnosis Date  . Allergic rhinitis   . Anxiety   . Chiari malformation   . DDD (degenerative disc disease), cervical   . DDD (degenerative disc disease), cervical   . Degenerative disc disease, lumbar   . Depression   . Fibromyalgia   . Hyperlipemia   . Migraine   . Scoliosis   . WPW (Wolff-Parkinson-White syndrome)     PAST SURGICAL HISTORY: Past Surgical History:  Procedure Laterality Date  . CESAREAN SECTION    . CYSTOSCOPY W/ URETERAL STENT PLACEMENT Right 04/25/2015   Procedure:  CYSTOSCOPY WITH RETROGRADE PYELOGRAM/URETERAL STENT PLACEMENT;  Surgeon: Ihor Gully, MD;  Location: [facility];  Service: Urology;  Laterality: Right;  . ears Bilateral   . EYE SURGERY Bilateral   . herneated bowel repair    . HERNIA REPAIR    . HOLMIUM LASER APPLICATION Right 04/25/2015   Procedure: HOLMIUM LASER APPLICATION;  Surgeon: Ihor Gully, MD;  Location: [facility];  Service: Urology;  Laterality: Right;  . KNEE SURGERY Bilateral    Right Knee x 1 - Left x3  . TUBAL LIGATION    . vein removed Left    arm    FAMILY HISTORY: Family History  Problem Relation Age of Onset  . Migraines Mother   . CAD Father   . Migraines Daughter     SOCIAL HISTORY: Social History   Socioeconomic History  . Marital  status: Divorced    Spouse name: Not on file  . Number of children: 2  . Years of education: 51  . Highest education level: Not on file  Occupational History    Comment: Not working  Social Needs  . Financial resource strain: Not on file  . Food insecurity:    Worry: Not on file    Inability: Not on file  . Transportation needs:    Medical: Not on file    Non-medical: Not on file  Tobacco Use  . Smoking status: Current Every Day Smoker    Packs/day: 0.25    Types: Cigarettes  . Smokeless tobacco: Never Used  Substance and Sexual Activity  . Alcohol use: No    Alcohol/week: 0.0 oz  . Drug use: No  . Sexual activity: Not on file  Lifestyle  . Physical activity:    Days per week: Not on file    Minutes per session: Not on file  . Stress: Not on file  Relationships  . Social connections:    Talks on phone: Not on file    Gets together: Not on file    Attends religious service: Not on file    Active member of club or organization: Not on file    Attends meetings of clubs or organizations: Not on file    Relationship status: Not on file  . Intimate partner violence:    Fear of current or ex partner: Not on file    Emotionally abused: Not on file    Physically abused: Not on file    Forced sexual activity: Not on file  Other Topics Concern  . Not on file  Social History Narrative   Patient lives at home with her two children and her mother.   Unemployed. - Patient trying to get social security.   Education high school.   Right handed.   Caffeine soda's four soda's daily.     PHYSICAL EXAM  Vitals:   08/03/17 1547  BP: 123/89  Pulse: 81  Weight: 188 lb (85.3 kg)  Height: 5\' 4"  (1.626 m)   Body mass index is 32.27 kg/m.  Generalized: Well developed, obese [name] in no acute distress  Head: normocephalic and atraumatic,. Oropharynx benign  Neck: Supple, no carotid bruits  Cardiac: Regular rate rhythm, no murmur  Musculoskeletal: No deformity    Neurological examination   Mentation: Alert oriented to time, place, history taking. Attention span and concentration appropriate. Recent and remote memory intact.  Follows all commands speech and language fluent.   Cranial nerve II-XII: Fundoscopic exam not done Pupils were equal round reactive to light extraocular movements were full,  visual field were full on confrontational test. Facial sensation and strength were normal. hearing was intact to finger rubbing bilaterally. Uvula tongue midline. head turning and shoulder shrug were normal and symmetric.Tongue protrusion into cheek strength was normal. Motor: normal bulk and tone, full strength in the BUE, BLE, fine finger movements normal, no pronator drift. No focal weakness Sensory: normal and symmetric to light touch, pinprick, and  Vibration, in the upper and lower extremities Coordination: finger-nose-finger, heel-to-shin bilaterally, no dysmetria Reflexes: Brachioradialis 2/2, biceps 2/2, triceps 2/2, patellar 2/2, Achilles 2/2, plantar responses were flexor bilaterally. Gait and Station: Rising up from seated position without assistance, normal stance,  moderate stride, good arm swing, smooth turning, able to perform tiptoe, and heel walking without difficulty. Tandem gait is steady  DIAGNOSTIC DATA (LABS, IMAGING, TESTING) - I reviewed patient records, labs, notes, testing and imaging myself where available.  Lab Results  Component Value Date   WBC 6.8 02/23/2017   HGB 14.9 02/23/2017   HCT 46.0 02/23/2017   MCV 90.7 02/23/2017   PLT 169 02/23/2017      Component Value Date/Time   NA 135 02/23/2017 1504   K 3.6 02/23/2017 1504   CL 106 02/23/2017 1504   CO2 19 (L) 02/23/2017 1504   GLUCOSE 93 02/23/2017 1504   BUN 9 02/23/2017 1504   CREATININE 0.75 02/23/2017 1504   CALCIUM 8.8 (L) 02/23/2017 1504   PROT 8.0 02/23/2017 1504   ALBUMIN 3.9 02/23/2017 1504   AST 33 02/23/2017 1504   ALT 47 02/23/2017 1504   ALKPHOS 87  02/23/2017 1504   BILITOT 0.9 02/23/2017 1504   GFRNONAA >60 02/23/2017 1504   GFRAA >60 02/23/2017 1504    ASSESSMENT AND PLAN  42 y.o. year old [name]   Chronic migraine headaches, Polypharmacy treatment  She has tried and failed multiple preventive medications in the past: 1Topamax and Depakote seizure medications 2Inderal beta blocker 3Nortriptyline tricyclic antidepressant 4Flexeril muscle relaxant She has failed Zomig Relpax and Imitrex acutely which are triptans  BOTOX injection was performed according to protocol by [facility]. 100 units of BOTOX was dissolved into 2 cc NS.      We skipped bilateral corrugate and procerus injection, 60 units were injected into bilateral upper cervical paraspinal, bilateral parietal vertex region  I also have written prescription for Ajovy 220 mg subcutaneous injection every 30 days, this is to be started in September 2019 if it is approved by her insurance company  Levert FeinsteinYijun Dhana Totton, M.D. Ph.D.  [facility] [address] [place], [place] [address] Phone: [phone] Fax:      [phone]

## 2017-08-23 ENCOUNTER — Ambulatory Visit (INDEPENDENT_AMBULATORY_CARE_PROVIDER_SITE_OTHER): Payer: Medicaid Other | Admitting: Neurology

## 2017-08-23 ENCOUNTER — Telehealth: Payer: Self-pay | Admitting: Neurology

## 2017-08-23 ENCOUNTER — Encounter: Payer: Self-pay | Admitting: Neurology

## 2017-08-23 VITALS — BP 101/75 | HR 79 | Ht 64.0 in | Wt 188.0 lb

## 2017-08-23 DIAGNOSIS — G43611 Persistent migraine aura with cerebral infarction, intractable, with status migrainosus: Secondary | ICD-10-CM

## 2017-08-23 DIAGNOSIS — I639 Cerebral infarction, unspecified: Principal | ICD-10-CM

## 2017-08-23 MED ORDER — TIZANIDINE HCL 4 MG PO TABS: 4.0000 mg | ORAL_TABLET | Freq: Four times a day (QID) | ORAL | 6 refills | Status: DC | PRN

## 2017-08-23 NOTE — Telephone Encounter (Signed)
Patient has had a headache for 2 weeks and would like to come in today for an infusion.

## 2017-08-23 NOTE — Progress Notes (Signed)
[facility]  PATIENT: Brooke HimHolly [name] DOB: 03/12/1975   REASON FOR VISIT: Follow-up for headaches  HISTORY FROM: Patient  HISTORY OF PRESENT ILLNESS: Brooke LimHolly Spradlinis a 42 year old right-handed female, referred by her primary care physician Dr. Julio Sickssei-Bonsu for evaluation of diffuse body achy pain, migraine  She carries a diagnosis of chronic migraine since 1983,, fibromyalgia since 2011, she complains of chronic neck pain, low back pain, joints pain, radiating pain to her left arm,  She moved from [place] to [place] in November 2013, has been under the [facility] pain management. Over the years, she has tried different medications,   Previously she has tried and failed Topamax, trigger point injection, which actually made her headache worse while she was in [place], she reported frequent emergency room visit, 3 times each month, receiving Dilaudid, She was also treated with Opana, fentanyl patch, Demerol in the past. she is now taking Norco,from her pain management  She reported long-standing history of migraine, her typical migraine are right retro-orbital area severe pounding headache with associated light noise sensitivity, resting helps, lasting few hours,  She has tried Fioricet, Flexeril, Norco,without help, previously she has tried different triptans, including Imitrex, Zomig, Maxalt, without helping,  She has tried and failed preventive medications Topamax, Depakote, Inderal, nortriptyline, Flexeril,  Has tried different abortive treatment, including Imitrex injection, sometimes make her headache worse, Zomig, Maxalt, Imitrex tablets,   Trigger for her headaches are environmental allergy, certain food, such as spicy food, acidic food,  She complains of few years history of bilateral feet parestheisa.increased after bearing weight , reported abnormal EMG nerve conduction study from outside facility in May 2015 consistent with peripheral  neuropathy  We have reviewed [facility] cervical[facility], there was evidence of multilevel degenerative disc disease, there was no significant canal, or foraminal stenosis  She is now currently taking Nucynta ER 150 mg every day for chronic migraine headache, she also daily Excedrin Migraine for her headaches, up to 4 tablets each day,   Migraine Frequency: daily since 2015, increased frequency and severity since Nucynta ER dosage was increased from 100 to 150mg  dose was increased in June 2017. She has severe headache was associated light, noise sensitivity, had to stay in dark quiet room, have her sunglasses even indoor.  Migraine Last from 5 hours to 12 hours.  UPDATE May 30 2016: We have discussed about Botox injection at her last visit in June 2017, but it never followed through, She reported more than 25 days of migraine in one month, this has been ongoing since last 10 years, She has been on chronic narcotics treatment, recently had major change of her pain medications, she is now taking percocet 10/325mg  4 tablets a day,since February 2018, she is still on long-acting narcotics Embeda 50/2 mg 1 capsule daily, he is also taking clonazepam 1 mg twice a day, BuSpar 15 mg twice a day, baclofen 10 mg twice a day, Lyrica 75 mg twice a day,  She lying in the dark trying to help her pain, she end up spending most of her time in bed, she lives with her family, she also smokes, is applying for disability.  She complains 8 out of 10 bilateral frontal area throbbing headache with associated light noise sensitivity, and times with associated nausea, dizziness, lasting hours to up to one week.  Update November 28th 2018 About 6 years ago in 2012, she had multiple Botox injections every 3 months at [place], responded  very well, She is now complains of daily moderate to severe migraine headaches, bilateral frontal, parietal region, at least 6 out of 10, on  polypharmacy treatment, Excedrin Migraine daily for migraine headaches,  Previously she has tried and failed multiple preventive medications, Topamax and Depakote antiepileptic medications Inderal beta blocker Nortriptyline tricyclic antidepressant Flexeril muscle relaxant She has failed Zomig Relpax and Imitrex acutely which are triptans  UPDATE August 03 2017: She complains 25/30 days of headaches, has to come to the office multiple times for iv infusion, report 10% improvement with BOTOX injection.  UPDATE August 23 2017:   REVIEW OF SYSTEMS: Full 14 system review of systems performed and notable only for those listed, all others are neg: as above.   ALLERGIES: Allergies  Allergen Reactions  . Digoxin And Related Nausea And Vomiting    Elevated 'white count'  . Toradol [Ketorolac Tromethamine] Other (See Comments)    Uncontrollable 'shakes'  . Latex Hives and Swelling  . Other Other (See Comments)    Any nasal sprays cause serious migranes  . Tape Itching and Rash    Please use "paper" tape    HOME MEDICATIONS: Outpatient Medications Prior to Visit  Medication Sig Dispense Refill  . baclofen (LIORESAL) 10 MG tablet Take 1 tablet (10 mg total) by mouth 2 (two) times daily. 20 each 0  . botulinum toxin Type A (BOTOX) 100 units SOLR injection Inject 200 Units into the muscle every 3 (three) months.    . busPIRone (BUSPAR) 15 MG tablet Take 15 mg by mouth 2 (two) times daily.    . Fremanezumab-vfrm (AJOVY) 225 MG/1.5ML SOSY Inject 225 mg into the skin every 30 (thirty) days. 1 mL 11  . levonorgestrel (MIRENA) 20 MCG/24HR IUD 1 each by Intrauterine route once.    . ondansetron (ZOFRAN ODT) 4 MG disintegrating tablet Take 1 tablet (4 mg total) by mouth every 8 (eight) hours as needed. 20 tablet 6  . Oxycodone HCl 10 MG TABS Take one tablet five times daily.    . pregabalin (LYRICA) 100 MG capsule Take 1 capsule (100 mg total) by mouth 2 (two) times daily. 10 capsule 0  .  SUMAtriptan (IMITREX) 6 MG/0.5ML SOLN injection Inject 0.5 mLs (6 mg total) into the skin every 2 (two) hours as needed for migraine or headache. May repeat in 2 hours if headache persists or recurs. 12 vial 11   No facility-administered medications prior to visit.     PAST MEDICAL HISTORY: Past Medical History:  Diagnosis Date  . Allergic rhinitis   . Anxiety   . Chiari malformation   . DDD (degenerative disc disease), cervical   . DDD (degenerative disc disease), cervical   . Degenerative disc disease, lumbar   . Depression   . Fibromyalgia   . Hyperlipemia   . Migraine   . Scoliosis   . WPW (Wolff-Parkinson-White syndrome)     PAST SURGICAL HISTORY: Past Surgical History:  Procedure Laterality Date  . CESAREAN SECTION    . CYSTOSCOPY W/ URETERAL STENT PLACEMENT Right 04/25/2015   Procedure: CYSTOSCOPY WITH RETROGRADE PYELOGRAM/URETERAL STENT PLACEMENT;  Surgeon: Ihor GullyMark Ottelin, MD;  Location: [facility];  Service: Urology;  Laterality: Right;  . ears Bilateral   . EYE SURGERY Bilateral   . herneated bowel repair    . HERNIA REPAIR    . HOLMIUM LASER APPLICATION Right 04/25/2015   Procedure: HOLMIUM LASER APPLICATION;  Surgeon: Ihor GullyMark Ottelin, MD;  Location: [facility];  Service: Urology;  Laterality: Right;  .  KNEE SURGERY Bilateral    Right Knee x 1 - Left x3  . TUBAL LIGATION    . vein removed Left    arm    FAMILY HISTORY: Family History  Problem Relation Age of Onset  . Migraines Mother   . CAD Father   . Migraines Daughter     SOCIAL HISTORY: Social History   Socioeconomic History  . Marital status: Divorced    Spouse name: Not on file  . Number of children: 2  . Years of education: 70  . Highest education level: Not on file  Occupational History    Comment: Not working  Social Needs  . Financial resource strain: Not on file  . Food insecurity:    Worry: Not on file    Inability: Not on file  . Transportation needs:    Medical: Not on file    Non-medical:  Not on file  Tobacco Use  . Smoking status: Current Every Day Smoker    Packs/day: 0.25    Types: Cigarettes  . Smokeless tobacco: Never Used  Substance and Sexual Activity  . Alcohol use: No    Alcohol/week: 0.0 oz  . Drug use: No  . Sexual activity: Not on file  Lifestyle  . Physical activity:    Days per week: Not on file    Minutes per session: Not on file  . Stress: Not on file  Relationships  . Social connections:    Talks on phone: Not on file    Gets together: Not on file    Attends religious service: Not on file    Active member of club or organization: Not on file    Attends meetings of clubs or organizations: Not on file    Relationship status: Not on file  . Intimate partner violence:    Fear of current or ex partner: Not on file    Emotionally abused: Not on file    Physically abused: Not on file    Forced sexual activity: Not on file  Other Topics Concern  . Not on file  Social History Narrative   Patient lives at home with her two children and her mother.   Unemployed. - Patient trying to get social security.   Education high school.   Right handed.   Caffeine soda's four soda's daily.     PHYSICAL EXAM  Vitals:   08/23/17 1522  BP: 101/75  Pulse: 79  Weight: 188 lb (85.3 kg)  Height: 5\' 4"  (1.626 m)   Body mass index is 32.27 kg/m.  Generalized: Well developed, obese female in no acute distress  Head: normocephalic and atraumatic,. Oropharynx benign  Neck: Supple, no carotid bruits  Cardiac: Regular rate rhythm, no murmur  Musculoskeletal: No deformity   Neurological examination   Mentation: Alert oriented to time, place, history taking. Attention span and concentration appropriate. Recent and remote memory intact.  Follows all commands speech and language fluent.   Cranial nerve II-XII: Fundoscopic exam not done Pupils were equal round reactive to light extraocular movements were full, visual field were full on confrontational test.  Facial sensation and strength were normal. hearing was intact to finger rubbing bilaterally. Uvula tongue midline. head turning and shoulder shrug were normal and symmetric.Tongue protrusion into cheek strength was normal. Motor: normal bulk and tone, full strength in the BUE, BLE, fine finger movements normal, no pronator drift. No focal weakness Sensory: normal and symmetric to light touch, pinprick, and  Vibration, in the upper and lower  extremities Coordination: finger-nose-finger, heel-to-shin bilaterally, no dysmetria Reflexes: Brachioradialis 2/2, biceps 2/2, triceps 2/2, patellar 2/2, Achilles 2/2, plantar responses were flexor bilaterally. Gait and Station: Rising up from seated position without assistance, normal stance,  moderate stride, good arm swing, smooth turning, able to perform tiptoe, and heel walking without difficulty. Tandem gait is steady  DIAGNOSTIC DATA (LABS, IMAGING, TESTING) - I reviewed patient records, labs, notes, testing and imaging myself where available.  Lab Results  Component Value Date   WBC 6.8 02/23/2017   HGB 14.9 02/23/2017   HCT 46.0 02/23/2017   MCV 90.7 02/23/2017   PLT 169 02/23/2017      Component Value Date/Time   NA 135 02/23/2017 1504   K 3.6 02/23/2017 1504   CL 106 02/23/2017 1504   CO2 19 (L) 02/23/2017 1504   GLUCOSE 93 02/23/2017 1504   BUN 9 02/23/2017 1504   CREATININE 0.75 02/23/2017 1504   CALCIUM 8.8 (L) 02/23/2017 1504   PROT 8.0 02/23/2017 1504   ALBUMIN 3.9 02/23/2017 1504   AST 33 02/23/2017 1504   ALT 47 02/23/2017 1504   ALKPHOS 87 02/23/2017 1504   BILITOT 0.9 02/23/2017 1504   GFRNONAA >60 02/23/2017 1504   GFRAA >60 02/23/2017 1504    ASSESSMENT AND PLAN  42 y.o. year old female   Chronic migraine headaches, Polypharmacy treatment  She has tried and failed multiple preventive medications in the past: 1Topamax and Depakote seizure medications 2Inderal beta blocker 3Nortriptyline tricyclic  antidepressant 4Flexeril muscle relaxant She has failed Zomig Relpax and Imitrex tablets acutely which are triptans  She came in today for intractable migraine headaches with 2 weeks, has failed home remedy treatment, Will perform nerve block today, Will skip Botox injection, Start Ajovy to 25 mg every month as migraine prevention, Continue Imitrex injection as needed together with Zofran, NSAIDs for migraine headache  Levert Feinstein, M.D. Ph.D.  [facility] [address] [place], [place] [address] Phone: [phone] Fax:      [phone]

## 2017-08-23 NOTE — Patient Instructions (Signed)
If you still have significant headaches,   you may takes Imitrex subcutaneous injection Mixed with Zofran, tizanidine, Aleve, given Benadryl and then go to sleep

## 2017-08-23 NOTE — Telephone Encounter (Signed)
Per vo by Dr. Terrace ArabiaYan, patient can come in for the following IV medications:  1) VPA 1gram 2) Compazine 10mg   Please note that patient is allergic to tapeand Latex. Additionally, she is allergic to ketorolac which is the reason it was not ordered.  She is agreeable to this plan and will be at our office today, with a driver, at 1:61WR1:45pm.  Dr. Terrace ArabiaYan has signed orders and [facility].

## 2017-08-23 NOTE — Progress Notes (Signed)
**  Sensorcaine 0.5%, NDC [id number], Lot [id number], Exp 10/2020.//mck,rn**

## 2017-08-23 NOTE — Telephone Encounter (Signed)
Patient arrived to the office for an infusion.  She has poor veins and [facility] was unable to gain IV access after multiple attempts.  She was placed on Dr. Zannie CoveYan's schedule treatment with nerve blocks.

## 2017-08-23 NOTE — Progress Notes (Signed)
   History:    Patient complains of intractable migraine headaches for 2 weeks, failed home remedy  Bilateral occipital and trigeminal nerve block; trigger point injection of bilateral cervical and upper trapezius muscles for intractable headache  Bupivacaine 0.5% was injected on the scalp bilaterally at several locations:  -On the occipital area of the head, 3 injections each side, 0.5 cc per injection at the midpoint between the mastoid process and the occipital protuberance. 2 other injections were done one finger breadth from the initial injection, one at a 10 o'clock position and the other at a 2 o'clock position.  -2 injections of 0.5 cc were done in the temporal regions, 2 fingerbreadths above the tragus of the ear, with the second injection one fingerbreadth posteriorly to the first.  -2 injections were done on the brow, 1 in the medial brow and one over the supraorbital nerve notch, with 0.1 cc for each injection  -1 injection each side of 0.5 cc was done anterior to the tragus of the ear for a trigeminal ganglion injection  -0.5 cc was injected into bilateral upper trapezius and bilateral upper cervical paraspinals   The patient tolerated the injections well, no complications of the procedure were noted. Injections were made with a 27-gauge needle.

## 2017-09-18 ENCOUNTER — Telehealth: Payer: Self-pay | Admitting: Neurology

## 2017-09-18 NOTE — Telephone Encounter (Signed)
Patient called and requested to schedule an apt for the "once a month migraine injection". We scheduled an apt for 08/12.

## 2017-09-18 NOTE — Telephone Encounter (Signed)
Patient is no longer receiving Botox.  She was suppose to start Ajovy in June but has not yet.  She plans to pick up the prescription on 09/19/17 and she will follow up in three months.

## 2017-09-19 ENCOUNTER — Encounter: Payer: Self-pay | Admitting: *Deleted

## 2017-09-19 ENCOUNTER — Telehealth: Payer: Self-pay | Admitting: *Deleted

## 2017-09-19 NOTE — Telephone Encounter (Signed)
Request for Ajovy [place] [employer] [phone]).  Decision pending.  Pt UJ#[id number] K.

## 2017-09-19 NOTE — Telephone Encounter (Signed)
PA approved through 09/14/2018.  ZO#[id number].

## 2017-10-02 ENCOUNTER — Ambulatory Visit: Payer: Medicaid Other | Admitting: Neurology

## 2017-11-01 ENCOUNTER — Telehealth: Payer: Self-pay | Admitting: Neurology

## 2017-11-01 MED ORDER — BUTALBITAL-APAP-CAFFEINE 50-325-40 MG PO TABS
ORAL_TABLET | ORAL | 3 refills | Status: DC
Start: 2017-11-01 — End: 2018-03-15

## 2017-11-01 MED ORDER — ERENUMAB-AOOE 70 MG/ML ~~LOC~~ SOAJ: 70.0000 mg | SUBCUTANEOUS | 11 refills | Status: DC

## 2017-11-01 NOTE — Addendum Note (Signed)
Addended by: Lindell Spar C on: 11/01/2017 03:41 PM   Modules accepted: Orders

## 2017-11-01 NOTE — Telephone Encounter (Signed)
Dr. Terrace Arabia has authorized a prescription of Fioricet to be sent to the pharmacy.  Taking 1 tab q8h, no more than 12 tabs per month.  Also, she would like her d/c Ajovy (added to her allergy list). Per vo, Dr. Terrace Arabia has changed her to Aimovig 70mg , one injection per month.  Request for Aimovig 70mg  PA completed through [facility] [phone]). Decision pending.  Pt XK#[id number] K

## 2017-11-01 NOTE — Telephone Encounter (Signed)
Pt's had a cluster HA for the past 3 days and unable to get it to break. Please call to advise

## 2017-11-01 NOTE — Telephone Encounter (Signed)
Patient had relief with nerve blocks in July 2019.  Per vo by Dr. Terrace Arabia, she can be worked into her schedule for nerve blocks.   Spoke to patient - she is asking for a Fioricet prescription so she does not have to come to the office.  Additionally, she has injected Ajovy twice into her arm.  The first injection caused a mild rash, only at the site.  The second injection caused a more significant rash, all the way down her arm, with itching.

## 2017-11-02 NOTE — Telephone Encounter (Signed)
PA approved for Aimovig 70mg .  WU#[id number].  Valid through 10/27/2018.  Pharmacy notified and will fill for the patient.

## 2017-11-07 ENCOUNTER — Ambulatory Visit: Payer: Medicaid Other | Admitting: Neurology

## 2017-11-20 ENCOUNTER — Telehealth: Payer: Self-pay | Admitting: Neurology

## 2017-11-20 NOTE — Telephone Encounter (Signed)
Patient requesting a referral to a neurosurgeon regarding a lot more migraines she is having that are coming from the base of her head. She would also like to discuss a spinal stimulator that pain management doctor wants her to have.

## 2017-11-20 NOTE — Telephone Encounter (Signed)
Spoke to patient - she is asking for an early follow up to discuss her increased migraine frequency and current medication regimen.  She has been added to NP schedule this week in an available slot.  She thought she had to be referred to a neurosurgeon in order to have MRI brain.  She is aware our office can place that order, if the provider feels it is appropriate.  In review of her chart, it shows that Brooke [name] previously order a MRI brain in 07/2016.  The patient is unsure why she never scheduled.  Additionally, she has been informed that if her pain management provider would like for her to be evaluated for a spinal cord stimulator, then a neurosurgery referral will need to be placed by him.

## 2017-11-21 NOTE — Progress Notes (Signed)
[facility]  PATIENT: Brooke [name] DOB: [birth year]   REASON FOR VISIT: Follow-up for migraine HISTORY FROM: Patient    HISTORY OF PRESENT ILLNESS: Brooke [name] a 42 year old right-handed female, referred by her primary care physician Dr. Julio Sicks for evaluation of diffuse body achy pain, migraine  She carries a diagnosis of chronic migraine since 1983,, fibromyalgia since 2011, she complains of chronic neck pain, low back pain, joints pain, radiating pain to her left arm,  She moved from [place] to [place] in November 2013, has been under the [facility] pain management. Over the years, she has tried different medications,   Previously she has tried and failed Topamax, trigger point injection, which actually made her headache worse while she was in [place], she reported frequent emergency room visit, 3 times each month, receiving Dilaudid, She was also treated with Opana, fentanyl patch, Demerol in the past. she is now taking Norco,from her pain management  She reported long-standing history of migraine, her typical migraine are right retro-orbital area severe pounding headache with associated light noise sensitivity, resting helps, lasting few hours,  She has tried Fioricet, Flexeril, Norco,without help, previously she has tried different triptans, including Imitrex, Zomig, Maxalt, without helping,  She has tried and failed preventive medications Topamax, Depakote, Inderal, nortriptyline, Flexeril,  Has tried different abortive treatment, including Imitrex injection, sometimes make her headache worse, Zomig, Maxalt, Imitrex tablets,   Trigger for her headaches are environmental allergy, certain food, such as spicy food, acidic food,  She complains of few years history of bilateral feet parestheisa.increased after bearing weight , reported abnormal EMG nerve conduction study from outside facility in May 2015 consistent with peripheral  neuropathy  We have reviewed MRI of cervical, and lumbar spine at [facility] in June 2015, there was evidence of multilevel degenerative disc disease, there was no significant canal, or foraminal stenosis  She is now currently taking Nucynta ER 150 mg every day for chronic migraine headache, she also daily Excedrin Migraine for her headaches, up to 4 tablets each day,   Migraine Frequency: daily since 2015, increased frequency and severity since Nucynta ER dosage was increased from 100 to 150mg  dose was increased in June 2017. She has severe headache was associated light, noise sensitivity, had to stay in dark quiet room, have her sunglasses even indoor.  Migraine Last from 5 hours to 12 hours.  UPDATE May 30 2016: We have discussed about Botox injection at her last visit in June 2017, but it never followed through, She reported more than 25 days of migraine in one month, this has been ongoing since last 10 years, She has been on chronic narcotics treatment, recently had major change of her pain medications, she is now taking percocet 10/325mg 4 tablets a day,since February 2018, she is still on long-acting narcotics Embeda50/2 mg 1 capsule daily, he is also taking clonazepam 1 mg twice a day, BuSpar 15 mg twice a day, baclofen 10 mg twice a day, Lyrica 75 mg twice a day,  She lying in the dark trying to help her pain, she end up spending most of her time in bed,she lives with her family, she also smokes, is applying for disability.  She complains 8 out of 10 bilateral frontal area throbbing headache with associated light noise sensitivity, and times with associated nausea, dizziness, lasting hours to up to one week.  Update November 28th 2018 About 6 years ago in 2012, she had multiple Botox injections every 3 months at [place], responded very well,  She is now complains of daily moderate to severe migraine headaches, bilateral frontal, parietal region, at least 6 out of 10, on  polypharmacy treatment, Excedrin Migraine daily for migraine headaches,  Previously she has tried and failed multiple preventive medications, Topamax and Depakote antiepileptic medications Inderal beta blocker Nortriptyline tricyclic antidepressant Flexeril muscle relaxant She has failed Zomig Relpax and Imitrex acutely which are triptans  UPDATE August 03 2017:YY She complains 25/30 days of headaches, has to come to the office multiple times for iv infusion, report 10% improvement with BOTOX injection. UPDATE 10/2/2019CM [name], 42 year old female returns for follow-up with daily headache.  When last seen by Dr. Terrace Arabia she was given a nerve block with little benefit.  She has had little benefit from Botox in the past as well.  She has been on multiple preventatives tried and failed  Topamax and Depakote antiepileptic medications Inderal beta blocker Nortriptyline tricyclic antidepressant Flexeril muscle relaxant She has failed Zomig Relpax and Imitrex acutely which are triptans She has been on Ajovy been switched to Aimovig.  She is only taken one injection so far and made her aware that it takes 3 to 4 months to get benefit.  She also goes to pain center for chronic pain.  Boyfriend is with her today says she snores and she complains of daytime drowsiness.  She has never had a sleep study.  She is also requesting MRI of the brain due to her frequent headaches.  This was ordered last year when I saw her but she never followed through.  Her pain clinic physician is sending her for evaluation for spinal cord stimulator for her chronic pain.  She returns for reevaluation  REVIEW OF SYSTEMS: Full 14 system review of systems performed and notable only for those listed, all others are neg:  Constitutional: neg  Cardiovascular: neg Ear/Nose/Throat: neg  Skin: neg Eyes: Blurred vision Respiratory: neg Gastroitestinal: neg  Hematology/Lymphatic: neg  Endocrine:  neg Musculoskeletal:neg Allergy/Immunology: Environmental allergies Neurological: Chronic pain goes to pain management Psychiatric: neg Sleep : Snoring daytime drowsiness   ALLERGIES: Allergies  Allergen Reactions  . Digoxin And Related Nausea And Vomiting    Elevated 'white count'  . Toradol [Ketorolac Tromethamine] Other (See Comments)    Uncontrollable 'shakes'  . Latex Hives and Swelling  . Other Other (See Comments)    Any nasal sprays cause serious migranes  . Ajovy [Fremanezumab-Vfrm] Rash  . Tape Itching and Rash    Please use "paper" tape    HOME MEDICATIONS: Outpatient Medications Prior to Visit  Medication Sig Dispense Refill  . baclofen (LIORESAL) 10 MG tablet Take 1 tablet (10 mg total) by mouth 2 (two) times daily. 20 each 0  . butalbital-acetaminophen-caffeine (FIORICET, ESGIC) 50-325-40 MG tablet Take one tablet every 8 hours as needed for migraine.  No more than 12 tabs per month.  No early refills. 12 tablet 3  . Erenumab-aooe (AIMOVIG) 70 MG/ML SOAJ Inject 70 mg into the skin every 30 (thirty) days. 1 pen 11  . levonorgestrel (MIRENA) 20 MCG/24HR IUD 1 each by Intrauterine route once.    . ondansetron (ZOFRAN ODT) 4 MG disintegrating tablet Take 1 tablet (4 mg total) by mouth every 8 (eight) hours as needed. 20 tablet 6  . Oxycodone HCl 10 MG TABS Take one tablet five times daily.    . SUMAtriptan (IMITREX) 6 MG/0.5ML SOLN injection Inject 0.5 mLs (6 mg total) into the skin every 2 (two) hours as needed for migraine or headache. May repeat in  2 hours if headache persists or recurs. 12 vial 11  . tiZANidine (ZANAFLEX) 4 MG tablet Take 1 tablet (4 mg total) by mouth every 6 (six) hours as needed for muscle spasms. 30 tablet 6  . pregabalin (LYRICA) 100 MG capsule Take 1 capsule (100 mg total) by mouth 2 (two) times daily. (Patient not taking: Reported on 11/22/2017) 10 capsule 0  . busPIRone (BUSPAR) 15 MG tablet Take 15 mg by mouth 2 (two) times daily.     No  facility-administered medications prior to visit.     PAST MEDICAL HISTORY: Past Medical History:  Diagnosis Date  . Allergic rhinitis   . Anxiety   . Chiari malformation   . DDD (degenerative disc disease), cervical   . DDD (degenerative disc disease), cervical   . Degenerative disc disease, lumbar   . Depression   . Fibromyalgia   . Hyperlipemia   . Migraine   . Scoliosis   . WPW (Wolff-Parkinson-White syndrome)     PAST SURGICAL HISTORY: Past Surgical History:  Procedure Laterality Date  . CESAREAN SECTION    . CYSTOSCOPY W/ URETERAL STENT PLACEMENT Right 04/25/2015   Procedure: CYSTOSCOPY WITH RETROGRADE PYELOGRAM/URETERAL STENT PLACEMENT;  Surgeon: Ihor Gully, MD;  Location: [facility];  Service: Urology;  Laterality: Right;  . ears Bilateral   . EYE SURGERY Bilateral   . herneated bowel repair    . HERNIA REPAIR    . HOLMIUM LASER APPLICATION Right 04/25/2015   Procedure: HOLMIUM LASER APPLICATION;  Surgeon: Ihor Gully, MD;  Location: [facility];  Service: Urology;  Laterality: Right;  . KNEE SURGERY Bilateral    Right Knee x 1 - Left x3  . TUBAL LIGATION    . vein removed Left    arm    FAMILY HISTORY: Family History  Problem Relation Age of Onset  . Migraines Mother   . CAD Father   . Migraines Daughter     SOCIAL HISTORY: Social History   Socioeconomic History  . Marital status: Divorced    Spouse name: Not on file  . Number of children: 2  . Years of education: 19  . Highest education level: Not on file  Occupational History    Comment: Not working  Social Needs  . Financial resource strain: Not on file  . Food insecurity:    Worry: Not on file    Inability: Not on file  . Transportation needs:    Medical: Not on file    Non-medical: Not on file  Tobacco Use  . Smoking status: Current Every Day Smoker    Packs/day: 0.25    Types: Cigarettes  . Smokeless tobacco: Never Used  Substance and Sexual Activity  . Alcohol use: No    Alcohol/week: 0.0  standard drinks  . Drug use: No  . Sexual activity: Not on file  Lifestyle  . Physical activity:    Days per week: Not on file    Minutes per session: Not on file  . Stress: Not on file  Relationships  . Social connections:    Talks on phone: Not on file    Gets together: Not on file    Attends religious service: Not on file    Active member of club or organization: Not on file    Attends meetings of clubs or organizations: Not on file    Relationship status: Not on file  . Intimate partner violence:    Fear of current or ex partner: Not on file  Emotionally abused: Not on file    Physically abused: Not on file    Forced sexual activity: Not on file  Other Topics Concern  . Not on file  Social History Narrative   Patient lives at home with her two children and her mother.   Unemployed. - Patient trying to get social security.   Education high school.   Right handed.   Caffeine soda's four soda's daily.     PHYSICAL EXAM  Vitals:   11/22/17 0927  BP: 122/90  Pulse: 87  Weight: 187 lb (84.8 kg)  Height: 5\' 4"  (1.626 m)   Body mass index is 32.1 kg/m.  Generalized: Well developed, obese female in no acute distress  Head: normocephalic and atraumatic,. Oropharynx benign  Neck: Supple,   Musculoskeletal: No deformity   Neurological examination   Mentation: Alert oriented to time, place, history taking. Attention span and concentration appropriate. Recent and remote memory intact.  Follows all commands speech and language fluent. ESS 15  Cranial nerve II-XII: .Pupils were equal round reactive to light extraocular movements were full, visual field were full on confrontational test. Facial sensation and strength were normal. hearing was intact to finger rubbing bilaterally. Uvula tongue midline. head turning and shoulder shrug were normal and symmetric.Tongue protrusion into cheek strength was normal. Motor: normal bulk and tone, full strength in the BUE,  BLE, Sensory: normal and symmetric to light touch, pinprick, and  Vibration, in the upper and lower extremities Coordination: finger-nose-finger, heel-to-shin bilaterally, no dysmetria Reflexes: Symmetric upper and lower, plantar responses were flexor bilaterally. Gait and Station: Rising up from seated position without assistance, normal stance,  moderate stride, good arm swing, smooth turning, able to perform tiptoe, and heel walking without difficulty. Tandem gait is steady  DIAGNOSTIC DATA (LABS, IMAGING, TESTING) - I reviewed patient records, labs, notes, testing and imaging myself where available.  Lab Results  Component Value Date   WBC 6.8 02/23/2017   HGB 14.9 02/23/2017   HCT 46.0 02/23/2017   MCV 90.7 02/23/2017   PLT 169 02/23/2017      Component Value Date/Time   NA 135 02/23/2017 1504   K 3.6 02/23/2017 1504   CL 106 02/23/2017 1504   CO2 19 (L) 02/23/2017 1504   GLUCOSE 93 02/23/2017 1504   BUN 9 02/23/2017 1504   CREATININE 0.75 02/23/2017 1504   CALCIUM 8.8 (L) 02/23/2017 1504   PROT 8.0 02/23/2017 1504   ALBUMIN 3.9 02/23/2017 1504   AST 33 02/23/2017 1504   ALT 47 02/23/2017 1504   ALKPHOS 87 02/23/2017 1504   BILITOT 0.9 02/23/2017 1504   GFRNONAA >60 02/23/2017 1504   GFRAA >60 02/23/2017 1504    ASSESSMENT AND PLAN  42 y.o. year old female  has a past medical history of daily chronic headache, polypharmacy      She has tried and failed multiple preventive medications in the past: 1Topamax and Depakote seizure medications 2Inderal beta blocker 3Nortriptyline tricyclic antidepressant 4Flexeril muscle relaxant She has failed Zomig Relpax and Imitrex tablets acutely which are triptans She obtained little benefit from Botox, no benefit from nerve block New complaint today of snoring and excessive daytime drowsiness, ESS 15  Continue Aimovig has just received 1 dose takes 3 to 4 months to be effective  continue Imitrex injections Will get MRI of the  brain We will get sleep study for excessive daytime drowsiness, snoring ESS score 15 Follow-up after sleep study Nilda Riggs, [facility], [facility], APRN  [facility]  [address], [place], [place] [address] [phone]

## 2017-11-22 ENCOUNTER — Ambulatory Visit: Payer: Medicaid Other | Admitting: Nurse Practitioner

## 2017-11-22 ENCOUNTER — Encounter: Payer: Self-pay | Admitting: Nurse Practitioner

## 2017-11-22 ENCOUNTER — Telehealth: Payer: Self-pay | Admitting: Nurse Practitioner

## 2017-11-22 VITALS — BP 122/90 | HR 87 | Ht 64.0 in | Wt 187.0 lb

## 2017-11-22 DIAGNOSIS — G43719 Chronic migraine without aura, intractable, without status migrainosus: Secondary | ICD-10-CM | POA: Diagnosis not present

## 2017-11-22 DIAGNOSIS — G8929 Other chronic pain: Secondary | ICD-10-CM

## 2017-11-22 DIAGNOSIS — R0683 Snoring: Secondary | ICD-10-CM

## 2017-11-22 DIAGNOSIS — R51 Headache: Secondary | ICD-10-CM | POA: Diagnosis not present

## 2017-11-22 DIAGNOSIS — R400 Somnolence: Secondary | ICD-10-CM | POA: Diagnosis not present

## 2017-11-22 DIAGNOSIS — R519 Headache, unspecified: Secondary | ICD-10-CM

## 2017-11-22 NOTE — Telephone Encounter (Signed)
Medicaid order sent to GI. They will obtain the auth and will reach out to the pt to schedule.  °

## 2017-11-22 NOTE — Patient Instructions (Signed)
Continue Aimovig has just received 1 dose  continue Imitrex injections Will get MRI of the brain We will get sleep study for excessive daytime drowsiness, snoring ESS score 15 Follow-up after sleep study

## 2017-11-29 NOTE — Progress Notes (Signed)
I have reviewed and agreed above plan. 

## 2017-12-04 IMAGING — CR DG LUMBAR SPINE COMPLETE 4+V
5 series · 5 of 5 positions shown · non-contrast
Comparison: 06/14/2013

CLINICAL DATA: Slip and fall with low back pain, initial encounter

EXAM:
LUMBAR SPINE - COMPLETE 4+ VIEW

[t lumbar spine ap]
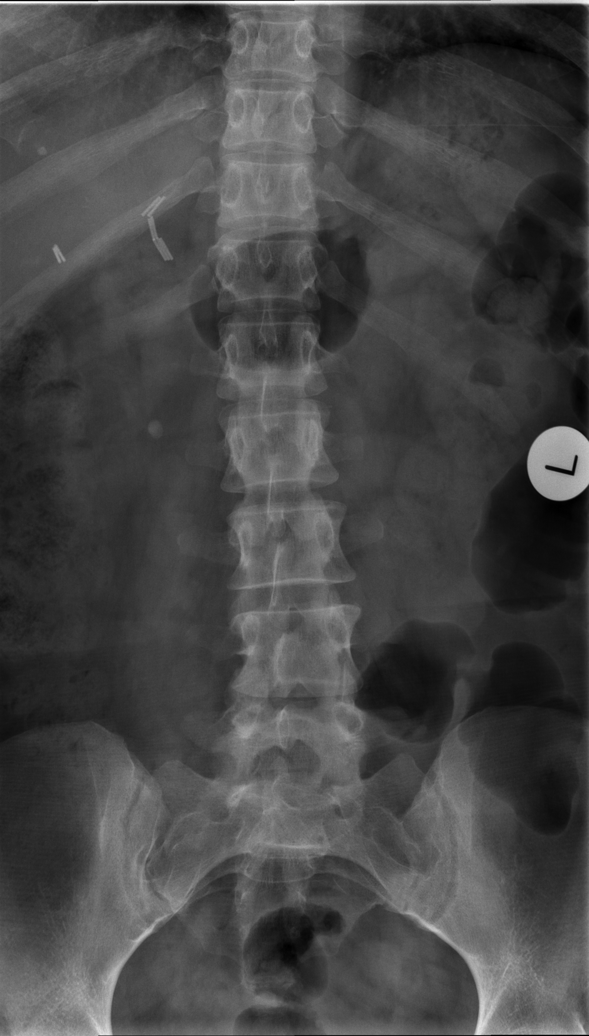

[t lumbar spine obl (1 of 2)]
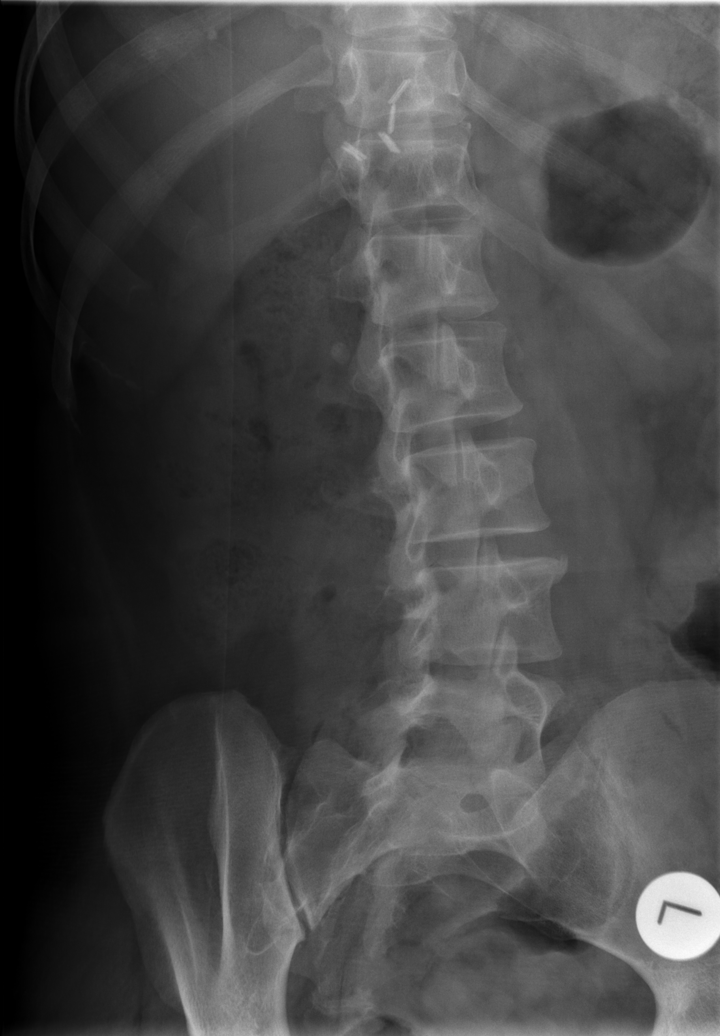

[t lumbar spine obl (2 of 2)]
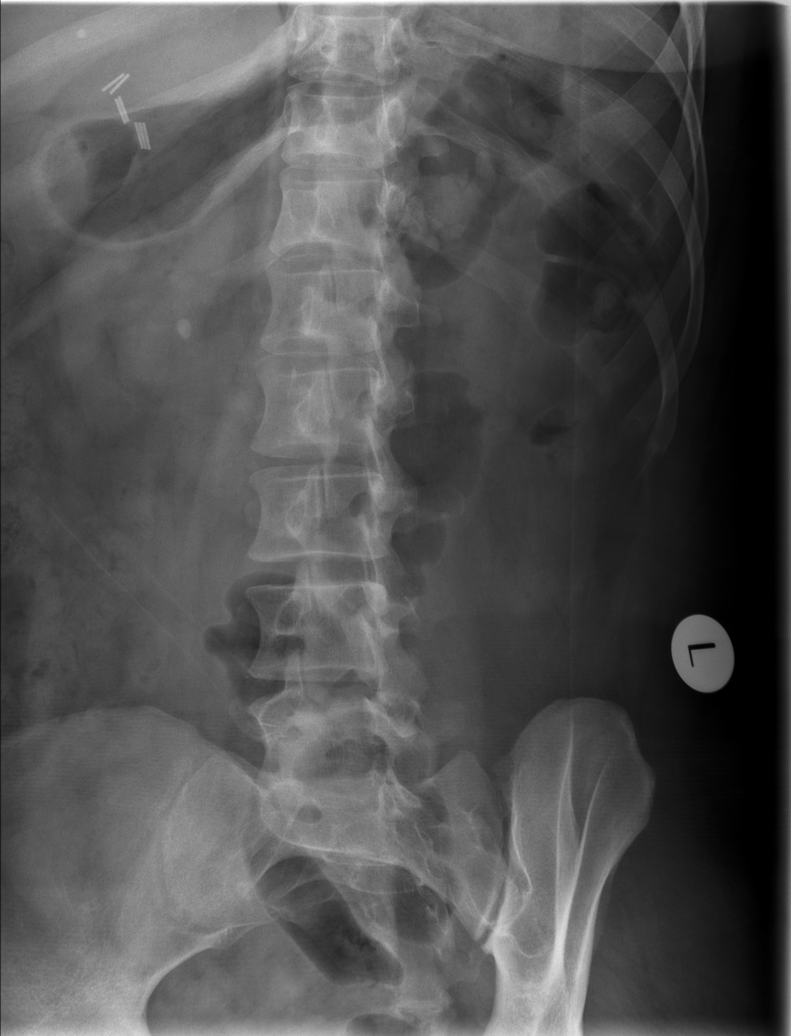

[t lumbar spine lat]
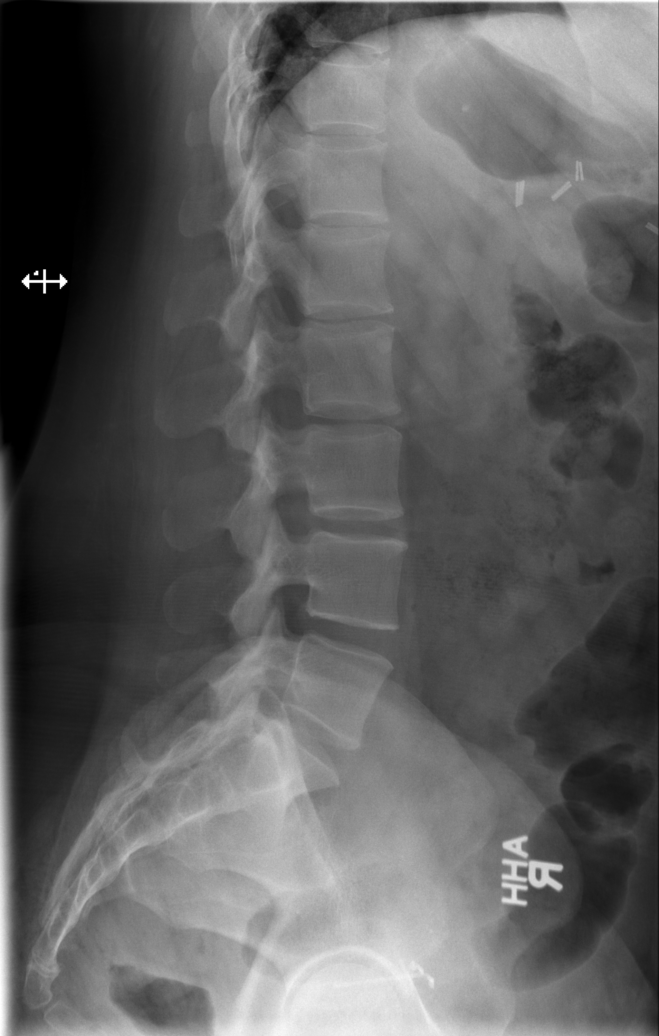

[t lumbar l-5 s-1 spot]
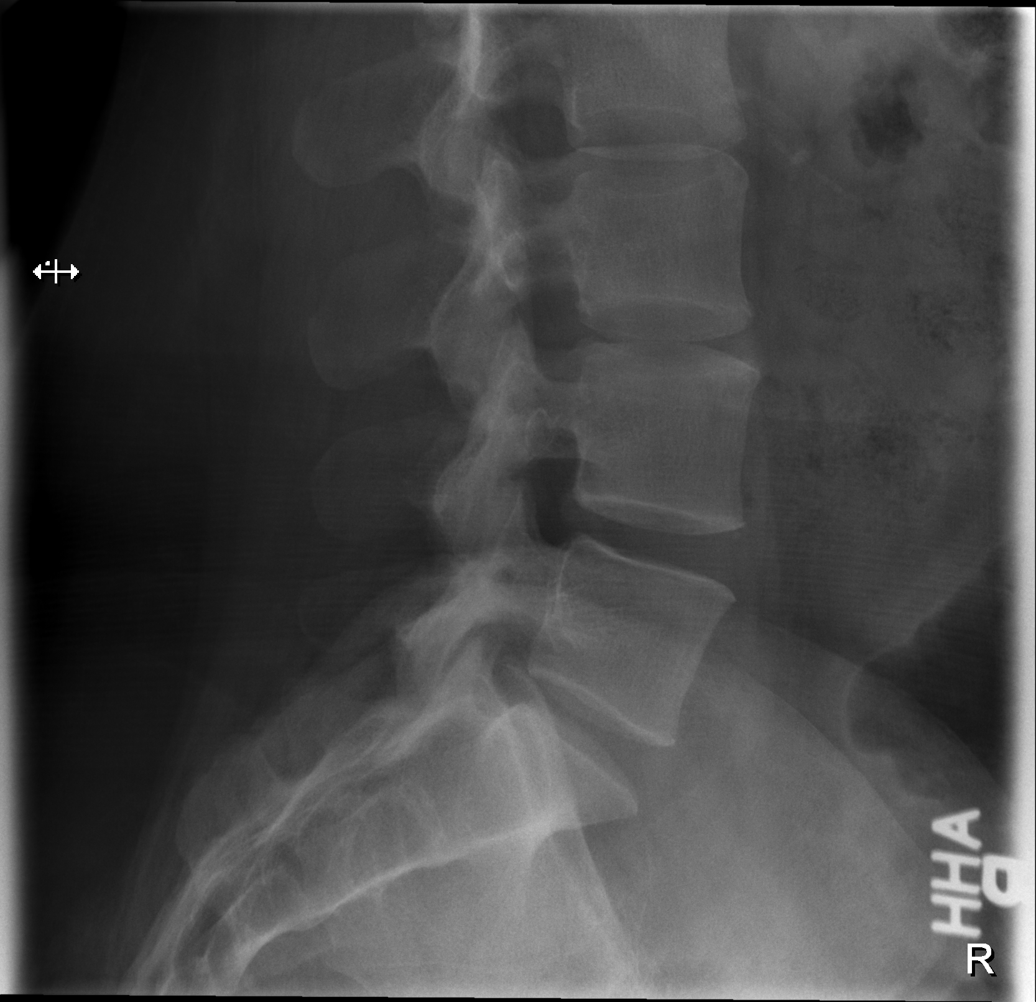

[5 of 5 positions shown; findings below may reference images not displayed]

FINDINGS: Five lumbar type vertebral bodies are well visualized. Vertebral
body height is well maintained. Very minimal osteophytic changes are
seen. No spondylolisthesis is noted. A right renal calculus is noted
which measures approximately 8 mm. This projects in the region of
the right renal pelvis. No other focal abnormality is noted.
IMPRESSION: Right renal calculus.

No acute bony abnormality noted.

## 2017-12-04 IMAGING — CR DG HIP (WITH OR WITHOUT PELVIS) 2-3V*L*
3 series · 3 of 3 positions shown · non-contrast
Comparison: None.

CLINICAL DATA: Slip and fall with left hip pain, initial encounter

EXAM:
DG HIP (WITH OR WITHOUT PELVIS) 2-3V LEFT

[t pelvis ap]
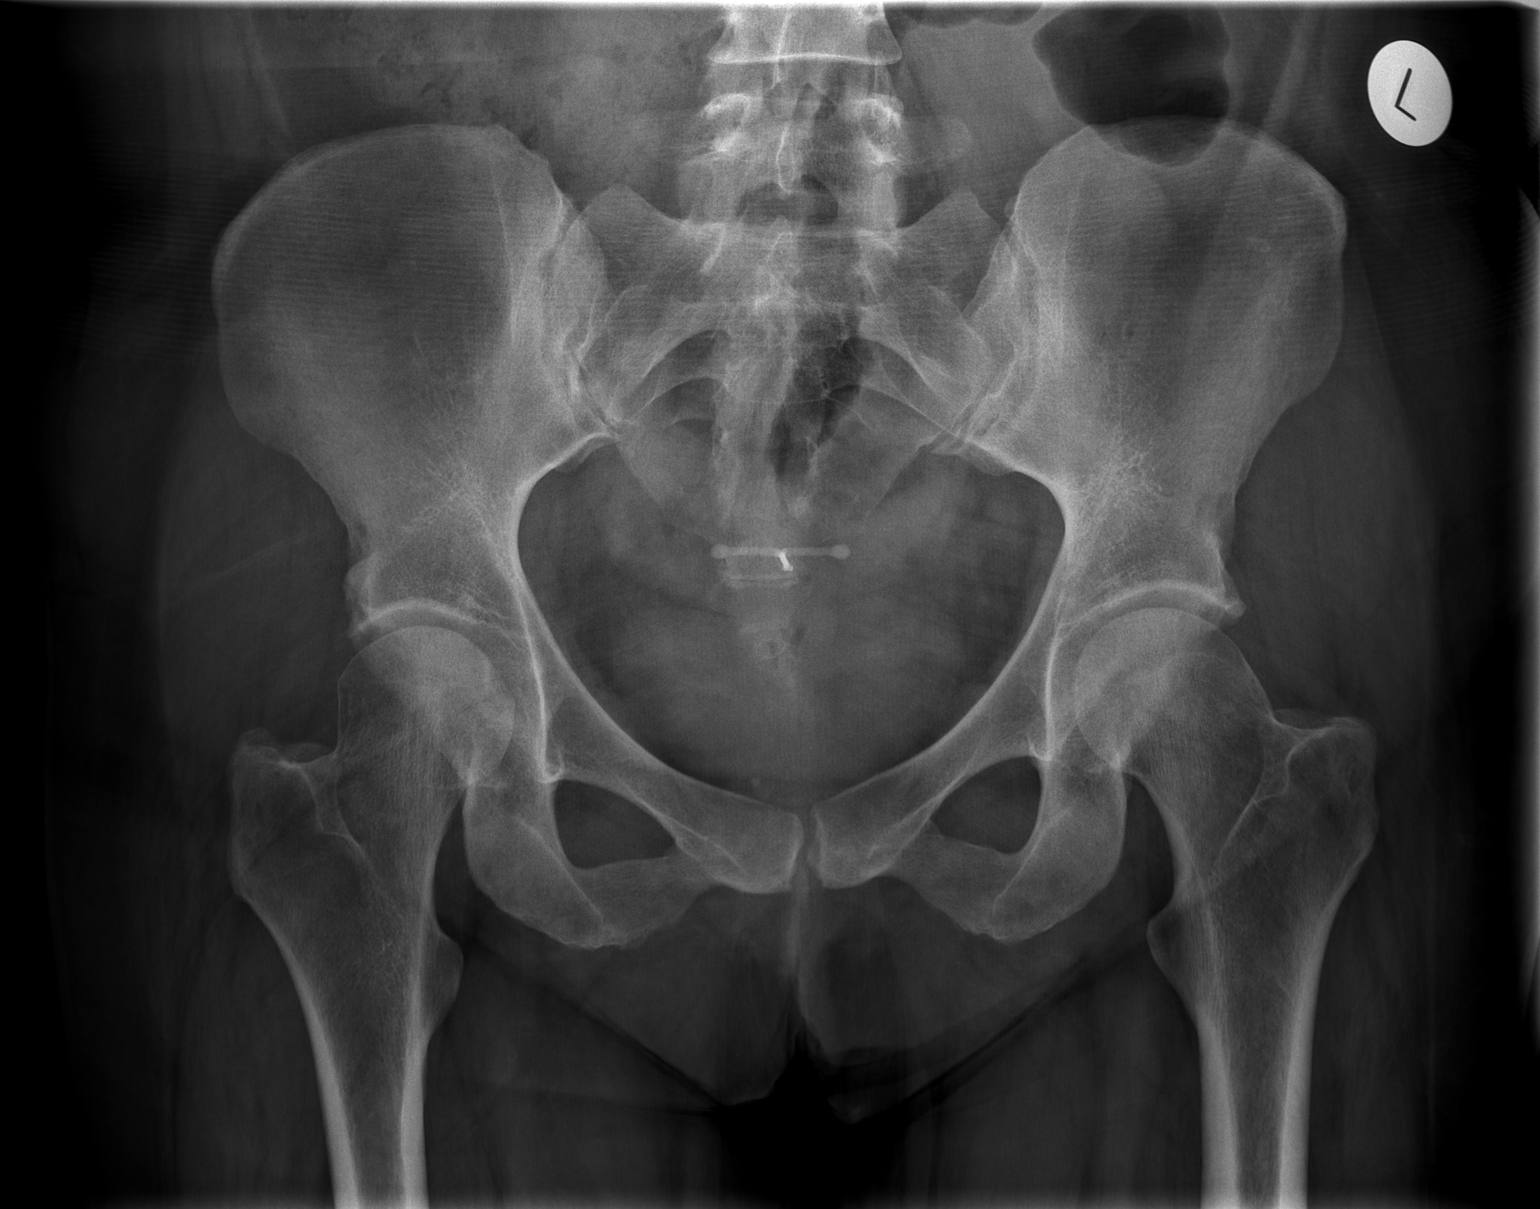

[t hip ap left]
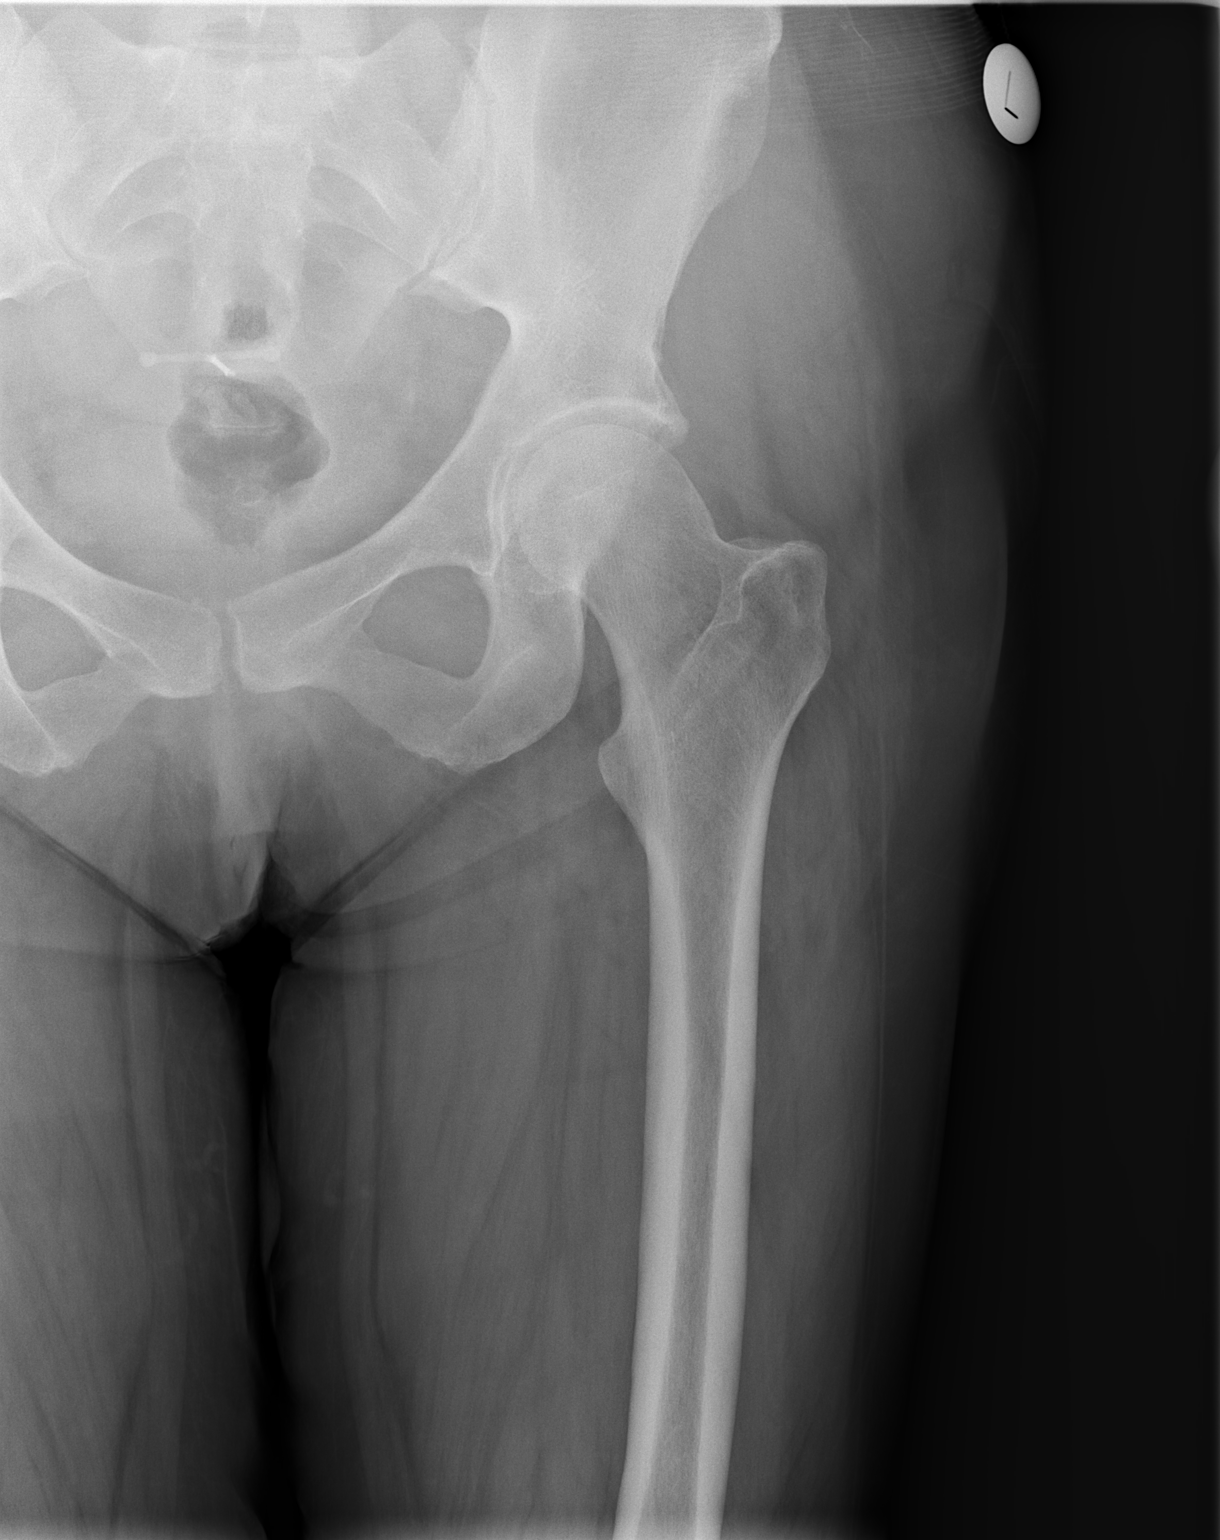

[t hip frog leg left]
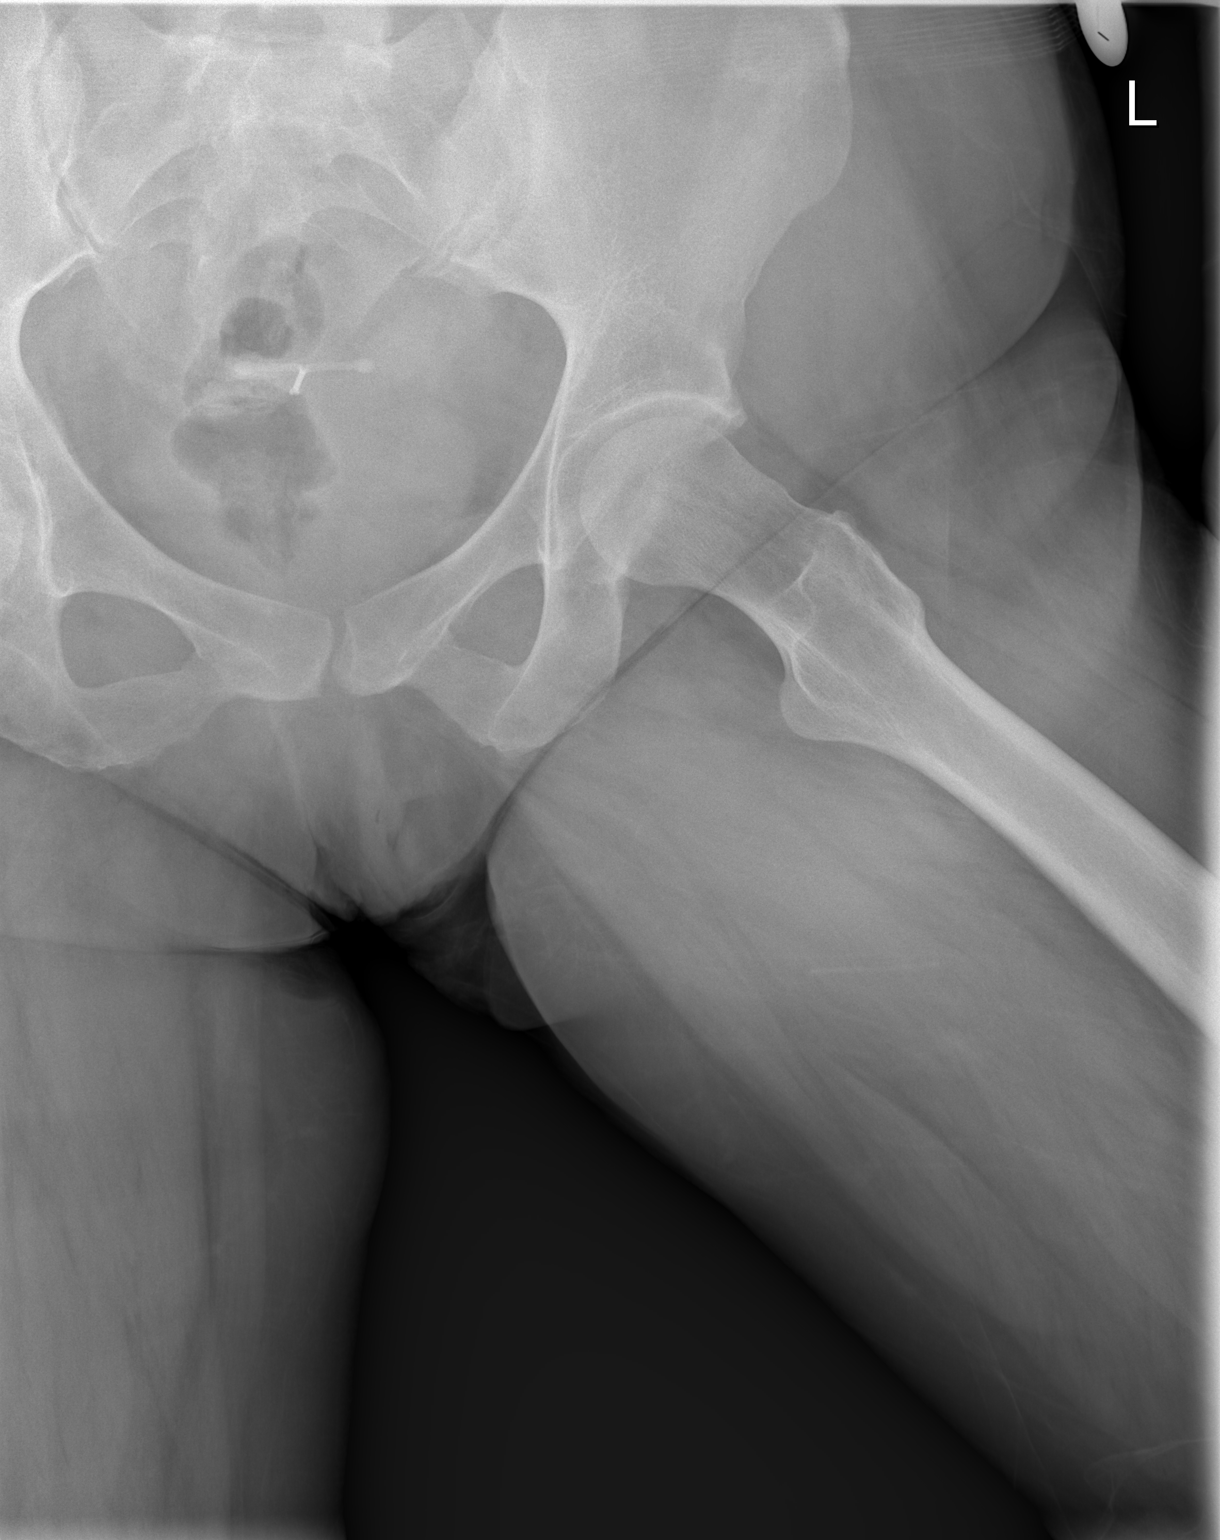

[3 of 3 positions shown; findings below may reference images not displayed]

FINDINGS: There is no evidence of hip fracture or dislocation. There is no
evidence of arthropathy or other focal bone abnormality. An IUD is
noted in place.
IMPRESSION: No acute abnormality noted.

## 2017-12-05 ENCOUNTER — Other Ambulatory Visit: Payer: Self-pay

## 2017-12-05 ENCOUNTER — Encounter (HOSPITAL_COMMUNITY): Payer: Self-pay

## 2017-12-05 ENCOUNTER — Ambulatory Visit (HOSPITAL_COMMUNITY): Payer: Medicaid Other | Admitting: Anesthesiology

## 2017-12-05 ENCOUNTER — Encounter (HOSPITAL_COMMUNITY)
Admission: RE | Disposition: A | Discharge status: Home / Self Care | Payer: Self-pay | Source: Ambulatory Visit | Attending: Gastroenterology

## 2017-12-05 ENCOUNTER — Ambulatory Visit (HOSPITAL_COMMUNITY)
Admission: RE | Admit: 2017-12-05 | Discharge: 2017-12-05 | Disposition: A | Discharge status: Home / Self Care | Payer: Medicaid Other | Source: Ambulatory Visit | Attending: Gastroenterology | Admitting: Gastroenterology

## 2017-12-05 DIAGNOSIS — K222 Esophageal obstruction: Secondary | ICD-10-CM | POA: Diagnosis not present

## 2017-12-05 DIAGNOSIS — F17210 Nicotine dependence, cigarettes, uncomplicated: Secondary | ICD-10-CM | POA: Insufficient documentation

## 2017-12-05 DIAGNOSIS — R1310 Dysphagia, unspecified: Secondary | ICD-10-CM | POA: Diagnosis present

## 2017-12-05 HISTORY — PX: ESOPHAGOGASTRODUODENOSCOPY (EGD) WITH PROPOFOL: SHX5813

## 2017-12-05 HISTORY — PX: SAVORY DILATION: SHX5439

## 2017-12-05 SURGERY — ESOPHAGOGASTRODUODENOSCOPY (EGD) WITH PROPOFOL
Anesthesia: Monitor Anesthesia Care

## 2017-12-05 MED ORDER — PROPOFOL 10 MG/ML IV BOLUS
INTRAVENOUS | Status: AC
  Filled 2017-12-05: qty 20

## 2017-12-05 MED ORDER — LACTATED RINGERS IV SOLN
INTRAVENOUS | Status: DC | PRN
  Administered 2017-12-05: 14:00:00 via INTRAVENOUS

## 2017-12-05 MED ORDER — PROPOFOL 10 MG/ML IV BOLUS
INTRAVENOUS | Status: DC | PRN
  Administered 2017-12-05: 20 mg via INTRAVENOUS
  Administered 2017-12-05: 30 mg via INTRAVENOUS
  Administered 2017-12-05 (×2): 20 mg via INTRAVENOUS

## 2017-12-05 MED ORDER — LIDOCAINE 2% (20 MG/ML) 5 ML SYRINGE
INTRAMUSCULAR | Status: DC | PRN
  Administered 2017-12-05: 40 mg via INTRAVENOUS

## 2017-12-05 SURGICAL SUPPLY — 14 items

## 2017-12-05 NOTE — Discharge Instructions (Signed)

## 2017-12-05 NOTE — Transfer of Care (Signed)
Immediate Anesthesia Transfer of Care Note  Patient: Brooke [name]  Procedure(s) Performed: ESOPHAGOGASTRODUODENOSCOPY (EGD) WITH PROPOFOL (N/A ) SAVORY DILATION (N/A )  Patient Location: PACU  Anesthesia Type:MAC  Level of Consciousness: sedated  Airway & Oxygen Therapy: Patient Spontanous Breathing and Patient connected to nasal cannula oxygen  Post-op Assessment: Report given to RN and Post -op Vital signs reviewed and stable  Post vital signs: Reviewed and stable  Last Vitals:  Vitals Value Taken Time  BP    Temp    Pulse 66 12/05/2017  2:14 PM  Resp 16 12/05/2017  2:14 PM  SpO2 100 % 12/05/2017  2:14 PM  Vitals shown include unvalidated device data.  Last Pain:  Vitals:   12/05/17 1231  TempSrc: Oral  PainSc: 0-No pain         Complications: No apparent anesthesia complications

## 2017-12-05 NOTE — Anesthesia Procedure Notes (Signed)
Date/Time: 12/05/2017 2:01 PM Performed by: Jhonnie Garner, CRNA Oxygen Delivery Method: Nasal cannula

## 2017-12-05 NOTE — Anesthesia Preprocedure Evaluation (Addendum)
Anesthesia Evaluation  Patient identified by MRN, date of birth, ID band Patient awake    Reviewed: Allergy & Precautions, NPO status , Patient's Chart, lab work & pertinent test results  Airway Mallampati: II  TM Distance: >3 FB Neck ROM: Full    Dental no notable dental hx. (+) Teeth Intact, Dental Advisory Given   Pulmonary Current Smoker,    Pulmonary exam normal breath sounds clear to auscultation       Cardiovascular Normal cardiovascular exam Rhythm:Regular Rate:Normal  WPW   Neuro/Psych  Headaches, Anxiety Depression Chiari malformation negative neurological ROS  negative psych ROS   GI/Hepatic negative GI ROS, Neg liver ROS,   Endo/Other  negative endocrine ROS  Renal/GU negative Renal ROS  negative genitourinary   Musculoskeletal negative musculoskeletal ROS (+) Arthritis , Osteoarthritis,  Fibromyalgia -  Abdominal   Peds  Hematology negative hematology ROS (+)   Anesthesia Other Findings dysphagia  Reproductive/Obstetrics                            Anesthesia Physical Anesthesia Plan  ASA: III  Anesthesia Plan: MAC   Post-op Pain Management:    Induction: Intravenous  PONV Risk Score and Plan: 1 and Propofol infusion and Treatment may vary due to age or medical condition  Airway Management Planned: Natural Airway and Simple Face Mask  Additional Equipment:   Intra-op Plan:   Post-operative Plan:   Informed Consent: I have reviewed the patients History and Physical, chart, labs and discussed the procedure including the risks, benefits and alternatives for the proposed anesthesia with the patient or authorized representative who has indicated his/her understanding and acceptance.   Dental advisory given  Plan Discussed with: CRNA  Anesthesia Plan Comments:         Anesthesia Quick Evaluation

## 2017-12-05 NOTE — Op Note (Signed)
[facility] Patient Name: Brooke [name] Procedure Date: 12/05/2017 MRN: [id number] Attending MD: Jeani Hawking , MD Date of Birth: [birth year] CSN: [id number] Age: 42 Admit Type: Outpatient Procedure:                Upper GI endoscopy Indications:              Dysphagia Providers:                Jeani Hawking, MD, Roselie Awkward, RN, Harrington Challenger,                            Technician Referring MD:              Medicines:                Propofol per Anesthesia Complications:            No immediate complications. Estimated Blood Loss:     Estimated blood loss was minimal. Procedure:                Pre-Anesthesia Assessment:                           - Prior to the procedure, a History and Physical                            was performed, and patient medications and                            allergies were reviewed. The patient's tolerance of                            previous anesthesia was also reviewed. The risks                            and benefits of the procedure and the sedation                            options and risks were discussed with the patient.                            All questions were answered, and informed consent                            was obtained. Prior Anticoagulants: The patient has                            taken no previous anticoagulant or antiplatelet                            agents. ASA Grade Assessment: III - A patient with                            severe systemic disease. After reviewing the risks                            and  benefits, the patient was deemed in                            satisfactory condition to undergo the procedure.                           - Sedation was administered by an anesthesia                            professional. Deep sedation was attained.                           After obtaining informed consent, the endoscope was                            passed under direct vision. Throughout the                            procedure, the patient's blood pressure, pulse, and                            oxygen saturations were monitored continuously. The                            GIF-H190 ([id number] Olympus adult endoscope was                            introduced through the mouth, and advanced to the                            second part of duodenum. The upper GI endoscopy was                            accomplished without difficulty. The patient                            tolerated the procedure well. 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 It       was not evident with passage of the endoscope. It was identified after       the Savary dilation. This stenosis measured 1.6 cm (inner diameter) x       less than one cm (in length). The stenosis was traversed. A guidewire       was placed and the scope was withdrawn. Dilation was performed with a       Savary dilator with no resistance at 18 mm. The dilation site was       examined following endoscope reinsertion and showed complete resolution       of luminal narrowing. Estimated blood loss was minimal. There was no       post procedureal crepitus noted.      The stomach was normal.      The examined duodenum was normal. Impression:               -  No endoscopic esophageal abnormality to explain                            patient's dysphagia. Esophagus dilated. Dilated.                           - Normal stomach.                           - Normal examined duodenum.                           - No specimens collected. Moderate Sedation:      N/A- Per Anesthesia Care Recommendation:           - Patient has a contact number available for                            emergencies. The signs and symptoms of potential                            delayed complications were discussed with the                             patient. Return to normal activities tomorrow.                            Written discharge instructions were provided to the                            patient.                           - Resume previous diet.                           - Continue present medications.                           - Return to GI clinic in 4 weeks. Procedure Code(s):        --- Professional ---                           [phone], Esophagogastroduodenoscopy, flexible,                            transoral; with insertion of guide wire followed by                            passage of dilator(s) through esophagus over guide                            wire Diagnosis Code(s):        --- Professional ---                           R13.10, Dysphagia, unspecified CPT copyright 2018 American Medical Association. All  rights reserved. The codes documented in this report are preliminary and upon coder review may  be revised to meet current compliance requirements. Jeani Hawking, MD Jeani Hawking, MD 12/05/2017 2:15:27 PM This report has been signed electronically. Number of Addenda: 0

## 2017-12-05 NOTE — Anesthesia Postprocedure Evaluation (Signed)
Anesthesia Post Note  Patient: Brooke [name]  Procedure(s) Performed: ESOPHAGOGASTRODUODENOSCOPY (EGD) WITH PROPOFOL (N/A ) SAVORY DILATION (N/A )     Patient location during evaluation: Endoscopy Anesthesia Type: MAC Level of consciousness: awake and alert Pain management: pain level controlled Vital Signs Assessment: post-procedure vital signs reviewed and stable Respiratory status: spontaneous breathing, nonlabored ventilation, respiratory function stable and patient connected to nasal cannula oxygen Cardiovascular status: stable and blood pressure returned to baseline Postop Assessment: no apparent nausea or vomiting Anesthetic complications: no    Last Vitals:  Vitals:   12/05/17 1415 12/05/17 1420  BP: (!) 95/52 99/64  Pulse: 66 66  Resp: 16 12  Temp:  36.6 C  SpO2: 100% 100%    Last Pain:  Vitals:   12/05/17 1420  TempSrc: Oral  PainSc:                  Gumaro Brightbill L Kennetha Pearman

## 2017-12-05 NOTE — H&P (Signed)
  Brooke [name] HPI: The patient complains about progressive dysphagia over the past year.  The patient finds it difficult to tolerate any solid food PO.  An EGD was not possible in the ASC setting as no vascular access was possible.  She was brought to the hospital in an attempt to obtain a central line for the EGD with dilation.  Past Medical History:  Diagnosis Date  . Allergic rhinitis   . Anxiety   . Chiari malformation   . DDD (degenerative disc disease), cervical   . DDD (degenerative disc disease), cervical   . Degenerative disc disease, lumbar   . Depression   . Fibromyalgia   . Hyperlipemia   . Migraine   . Scoliosis   . WPW (Wolff-Parkinson-White syndrome)     Past Surgical History:  Procedure Laterality Date  . CESAREAN SECTION    . CYSTOSCOPY W/ URETERAL STENT PLACEMENT Right 04/25/2015   Procedure: CYSTOSCOPY WITH RETROGRADE PYELOGRAM/URETERAL STENT PLACEMENT;  Surgeon: Ihor Gully, MD;  Location: [facility];  Service: Urology;  Laterality: Right;  . ears Bilateral   . EYE SURGERY Bilateral   . herneated bowel repair    . HERNIA REPAIR    . HOLMIUM LASER APPLICATION Right 04/25/2015   Procedure: HOLMIUM LASER APPLICATION;  Surgeon: Ihor Gully, MD;  Location: [facility];  Service: Urology;  Laterality: Right;  . KNEE SURGERY Bilateral    Right Knee x 1 - Left x3  . TUBAL LIGATION    . vein removed Left    arm    Family History  Problem Relation Age of Onset  . Migraines Mother   . CAD Father   . Migraines Daughter     Social History:  reports that she has been smoking cigarettes. She has been smoking about 0.25 packs per day. She has never used smokeless tobacco. She reports that she does not drink alcohol or use drugs.  Allergies:  Allergies  Allergen Reactions  . Digoxin And Related Nausea And Vomiting    Elevated 'white count'  . Toradol [Ketorolac Tromethamine] Other (See Comments)    Uncontrollable 'shakes'  . Latex Hives and Swelling  . Other Other  (See Comments)    Any nasal sprays cause serious migranes  . Ajovy [Fremanezumab-Vfrm] Rash  . Tape Itching and Rash    Please use "paper" tape    Medications: Scheduled: Continuous:  No results found for this or any previous visit (from the past 24 hour(s)).   No results found.  ROS:  As stated above in the HPI otherwise negative.  Blood pressure (!) 133/96, pulse 78, temperature 98.2 F (36.8 C), temperature source Oral, resp. rate 17, height 5\' 4"  (1.626 m), weight 82.6 kg, SpO2 100 %.    PE: Gen: NAD, Alert and Oriented HEENT:  [place]/AT, EOMI Neck: Supple, no LAD Lungs: CTA Bilaterally CV: RRR without M/G/R ABM: Soft, NTND, +BS Ext: No C/C/E  Assessment/Plan: 1) EGD with dilation for her dysphagia.  Marwin Primmer D 12/05/2017, 12:40 PM

## 2017-12-06 ENCOUNTER — Encounter (HOSPITAL_COMMUNITY): Payer: Self-pay | Admitting: Gastroenterology

## 2017-12-14 ENCOUNTER — Ambulatory Visit
Admission: RE | Admit: 2017-12-14 | Discharge: 2017-12-14 | Disposition: A | Discharge status: Home / Self Care | Payer: Medicaid Other | Source: Ambulatory Visit | Attending: Nurse Practitioner | Admitting: Nurse Practitioner

## 2017-12-14 DIAGNOSIS — R51 Headache: Secondary | ICD-10-CM

## 2017-12-14 DIAGNOSIS — R519 Headache, unspecified: Secondary | ICD-10-CM

## 2017-12-14 DIAGNOSIS — G43719 Chronic migraine without aura, intractable, without status migrainosus: Secondary | ICD-10-CM

## 2017-12-18 ENCOUNTER — Encounter: Payer: Self-pay | Admitting: *Deleted

## 2017-12-18 ENCOUNTER — Telehealth: Payer: Self-pay | Admitting: Neurology

## 2017-12-18 NOTE — Telephone Encounter (Signed)
Attempted to reach patient to discuss results.  Her phone had the following message:  "At the subscriber's request, this phone does not accept incoming calls".  She has pending appt with Dr. Terrace Arabia on 01/09/18.  Her MRI results will be reviewed at her follow up.  Additionally, I mailed her an unable to contact letter to her home address.

## 2017-12-18 NOTE — Telephone Encounter (Signed)
Please call patient MRI of the brain showed mild abnormality, single isolated right deep frontal gliosis, most consistent with chronic migraine headaches small vessel disease,   IMPRESSION: Borderline abnormal MRI scan of the brain showing a solitary right frontal white matter hyperintensity with the differential discussed above.  No other significant abnormalities are noted.

## 2017-12-26 ENCOUNTER — Institutional Professional Consult (permissible substitution): Payer: Medicaid Other | Admitting: Neurology

## 2017-12-26 ENCOUNTER — Encounter: Payer: Self-pay | Admitting: Neurology

## 2018-01-02 ENCOUNTER — Encounter (HOSPITAL_COMMUNITY): Payer: Self-pay

## 2018-01-02 ENCOUNTER — Ambulatory Visit (HOSPITAL_COMMUNITY)
Admission: EM | Admit: 2018-01-02 | Discharge: 2018-01-02 | Disposition: A | Discharge status: Home / Self Care | Payer: Medicaid Other | Attending: Family Medicine | Admitting: Family Medicine

## 2018-01-02 DIAGNOSIS — H9192 Unspecified hearing loss, left ear: Secondary | ICD-10-CM | POA: Diagnosis not present

## 2018-01-02 DIAGNOSIS — Z87898 Personal history of other specified conditions: Secondary | ICD-10-CM

## 2018-01-02 DIAGNOSIS — H6122 Impacted cerumen, left ear: Secondary | ICD-10-CM | POA: Diagnosis not present

## 2018-01-02 NOTE — ED Provider Notes (Addendum)
[facility]    CSN: [id number] Arrival date & time: 01/02/18  1950     History   Chief Complaint Chief Complaint  Patient presents with  . Left Ear Problem    HPI Brooke [name] is a 42 y.o. female.   HPI  Patient is here for left ear pressure and decreased hearing.  She thinks she has wax buildup.  She would like to have this taken care of.  No pain.  No drainage.  No respiratory symptoms.  No prior history of ear problems. She also inquires if I can order a mammogram for her.  She called her primary care doctor and asked for mammogram.  She has not heard back yet.  I told her that since she did have a primary care doctor who is likely working on this for her, I did not think duplicating that order would be in her best interest.  I did advise her to call the primary care doctor's office first thing tomorrow morning  Past Medical History:  Diagnosis Date  . Allergic rhinitis   . Anxiety   . Chiari malformation   . DDD (degenerative disc disease), cervical   . DDD (degenerative disc disease), cervical   . Degenerative disc disease, lumbar   . Depression   . Fibromyalgia   . Hyperlipemia   . Migraine   . Scoliosis   . WPW (Wolff-Parkinson-White syndrome)     Patient Active Problem List   Diagnosis Date Noted  . Somnolence, daytime 11/22/2017  . Snoring 11/22/2017  . Chronic migraine 05/30/2016  . Polypharmacy 05/30/2016  . Atrophy of quadriceps femoris muscle 05/26/2016  . Patellar subluxation, left, sequela 05/26/2016  . Status migrainosus 08/21/2015  . Intractable pain 04/26/2015  . Hydronephrosis determined by ultrasound 04/26/2015  . Right flank pain   . Ureteral calculus, right 04/25/2015    Class: Acute  . Kidney stone 04/20/2015  . Obstruction of right ureteropelvic junction due to stone 04/20/2015  . UTI (lower urinary tract infection) 04/20/2015  . Fibromyalgia 04/20/2015  . Chronic pain disorder 04/20/2015  . Depression   . Anxiety   .  Intractable chronic migraine without aura and without status migrainosus 03/26/2014  . Migraine 12/24/2013  . Persistent headaches 09/12/2012  . 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 Tobacco abuse 09/12/2012    Past Surgical History:  Procedure Laterality Date  . CESAREAN SECTION    . CYSTOSCOPY W/ URETERAL STENT PLACEMENT Right 04/25/2015   Procedure: CYSTOSCOPY WITH RETROGRADE PYELOGRAM/URETERAL STENT PLACEMENT;  Surgeon: Ihor GullyMark Ottelin, MD;  Location: [facility];  Service: Urology;  Laterality: Right;  . ears Bilateral   . ESOPHAGOGASTRODUODENOSCOPY (EGD) WITH PROPOFOL N/A 12/05/2017   Procedure: ESOPHAGOGASTRODUODENOSCOPY (EGD) WITH PROPOFOL;  Surgeon: Jeani HawkingHung, Patrick, MD;  Location: [facility];  Service: Endoscopy;  Laterality: N/A;  . EYE SURGERY Bilateral   . herneated bowel repair    . HERNIA REPAIR    . HOLMIUM LASER APPLICATION Right 04/25/2015   Procedure: HOLMIUM LASER APPLICATION;  Surgeon: Ihor GullyMark Ottelin, MD;  Location: [facility];  Service: Urology;  Laterality: Right;  . KNEE SURGERY Bilateral    Right Knee x 1 - Left x3  . SAVORY DILATION N/A 12/05/2017   Procedure: SAVORY DILATION;  Surgeon: Jeani HawkingHung, Patrick, MD;  Location: [facility];  Service: Endoscopy;  Laterality: N/A;  . TUBAL LIGATION    . vein removed Left    arm    OB History   None      Home Medications  Prior to Admission medications   Medication Sig Start Date End Date Taking? Authorizing Provider  baclofen (LIORESAL) 10 MG tablet Take 1 tablet (10 mg total) by mouth 2 (two) times daily. 02/16/17   Janne Napoleon, NP  butalbital-acetaminophen-caffeine (FIORICET, ESGIC) [phone] MG tablet Take one tablet every 8 hours as needed for migraine.  No more than 12 tabs per month.  No early refills. 11/01/17   Levert Feinstein, MD  diphenhydrAMINE (BENADRYL) 25 mg capsule Take 25 mg by mouth daily as needed for allergies.    [provider]  Erenumab-aooe (AIMOVIG) 70 MG/ML SOAJ Inject 70 mg into the skin every 30 (thirty)  days. 11/01/17   Levert Feinstein, MD  levonorgestrel (MIRENA) 20 MCG/24HR IUD 1 each by Intrauterine route once.    [provider]  loratadine (CLARITIN) 10 MG tablet Take 10 mg by mouth daily.    [provider]  Multiple Vitamins-Minerals (MULTIVITAMIN WITH MINERALS) tablet Take 1 tablet by mouth daily.    [provider]  ondansetron (ZOFRAN ODT) 4 MG disintegrating tablet Take 1 tablet (4 mg total) by mouth every 8 (eight) hours as needed. 04/26/17   Levert Feinstein, MD  Oxycodone HCl 10 MG TABS Take 10 mg by mouth 5 (five) times daily. Take one tablet five times daily.     [provider]  SUMAtriptan (IMITREX) 6 MG/0.5ML SOLN injection Inject 0.5 mLs (6 mg total) into the skin every 2 (two) hours as needed for migraine or headache. May repeat in 2 hours if headache persists or recurs. 04/26/17   Levert Feinstein, MD  tiZANidine (ZANAFLEX) 4 MG tablet Take 1 tablet (4 mg total) by mouth every 6 (six) hours as needed for muscle spasms. 08/23/17   Levert Feinstein, MD    Family History Family History  Problem Relation Age of Onset  . Migraines Mother   . CAD Father   . Migraines Daughter     Social History Social History   Tobacco Use  . Smoking status: Current Every Day Smoker    Packs/day: 0.25    Types: Cigarettes  . Smokeless tobacco: Never Used  Substance Use Topics  . Alcohol use: No    Alcohol/week: 0.0 standard drinks  . Drug use: No     Allergies   Digoxin and related; Toradol [ketorolac tromethamine]; Latex; Other; Ajovy [fremanezumab-vfrm]; and Tape   Review of Systems Review of Systems  Constitutional: Negative for chills and fever.  HENT: Positive for hearing loss. Negative for dental problem, ear discharge, ear pain and sore throat.   Eyes: Negative for pain and visual disturbance.  Respiratory: Negative for cough and shortness of breath.   Cardiovascular: Negative for chest pain and palpitations.  Gastrointestinal: Negative for abdominal pain and  vomiting.  Genitourinary: Negative for dysuria and hematuria.  Musculoskeletal: Negative for arthralgias and back pain.  Skin: Negative for color change and rash.  Neurological: Negative for seizures and syncope.  Psychiatric/Behavioral: Negative for behavioral problems and dysphoric mood.  All other systems reviewed and are negative.    Physical Exam Triage Vital Signs ED Triage Vitals  Enc Vitals Group     BP 01/02/18 2014 (!) 139/113     Pulse Rate 01/02/18 2014 (!) 106     Resp 01/02/18 2014 20     Temp 01/02/18 2014 98 F (36.7 C)     Temp Source 01/02/18 2014 Oral     SpO2 01/02/18 2014 98 %     Weight --  Height --      Head Circumference --      Peak Flow --      Pain Score 01/02/18 2015 2     Pain Loc --      Pain Edu? --      Excl. in GC? --    No data found.  Updated Vital Signs BP (!) 139/113 (BP Location: Left Arm)   Pulse (!) 106   Temp 98 F (36.7 C) (Oral)   Resp 20   SpO2 98%      Physical Exam  Constitutional: She appears well-developed and well-nourished. No distress.  HENT:  Head: Normocephalic and atraumatic.  Right Ear: External ear normal.  Mouth/Throat: Oropharynx is clear and moist.  Left TM is occluded by cerumen  Eyes: Pupils are equal, round, and reactive to light. Conjunctivae and EOM are normal.  Neck: Normal range of motion. Neck supple.  Cardiovascular: Normal rate, regular rhythm and normal heart sounds.  Pulmonary/Chest: Effort normal and breath sounds normal. No respiratory distress.  Abdominal: Soft. She exhibits no distension.  Musculoskeletal: Normal range of motion. She exhibits no edema.  Neurological: She is alert.  Skin: Skin is warm and dry.  Psychiatric: She has a normal mood and affect. Her behavior is normal.   After curette and flush ear is clear  UC Treatments / Results  Labs (all labs ordered are listed, but only abnormal results are displayed) Labs Reviewed - No data to  display  EKG None  Radiology No results found.  Procedures Procedures (including critical care time)  Medications Ordered in UC Medications - No data to display  Initial Impression / Assessment and Plan / UC Course  I have reviewed the triage vital signs and the nursing notes.  Pertinent labs & imaging results that were available during my care of the patient were reviewed by me and considered in my medical decision making (see chart for details).      Final Clinical Impressions(s) / UC Diagnoses   Final diagnoses:  None   Discharge Instructions   None    ED Prescriptions    None     Controlled Substance Prescriptions [place] Controlled Substance Registry consulted? Not Applicable   Eustace Moore, MD 01/02/18 2049    Eustace Moore, MD 01/02/18 2050    Eustace Moore, MD 01/02/18 2051

## 2018-01-02 NOTE — ED Triage Notes (Signed)
Pt presents with not being able to hear otu of left ear and some discomfort.

## 2018-01-02 NOTE — Discharge Instructions (Signed)
Do not use q tip cotton swab in ear If you have wax buildup, use ear drops from pharmacy Call your PCP in [place]

## 2018-01-09 ENCOUNTER — Emergency Department (HOSPITAL_COMMUNITY): Payer: Medicaid Other

## 2018-01-09 ENCOUNTER — Ambulatory Visit: Payer: Self-pay | Admitting: Neurology

## 2018-01-09 ENCOUNTER — Emergency Department (HOSPITAL_COMMUNITY)
Admission: EM | Admit: 2018-01-09 | Discharge: 2018-01-09 | Disposition: A | Discharge status: Home / Self Care | Payer: Medicaid Other | Attending: Emergency Medicine | Admitting: Emergency Medicine

## 2018-01-09 DIAGNOSIS — F17210 Nicotine dependence, cigarettes, uncomplicated: Secondary | ICD-10-CM | POA: Diagnosis not present

## 2018-01-09 DIAGNOSIS — R1084 Generalized abdominal pain: Secondary | ICD-10-CM | POA: Insufficient documentation

## 2018-01-09 DIAGNOSIS — K649 Unspecified hemorrhoids: Secondary | ICD-10-CM | POA: Diagnosis not present

## 2018-01-09 DIAGNOSIS — K5900 Constipation, unspecified: Secondary | ICD-10-CM | POA: Insufficient documentation

## 2018-01-09 DIAGNOSIS — Z79899 Other long term (current) drug therapy: Secondary | ICD-10-CM | POA: Diagnosis not present

## 2018-01-09 DIAGNOSIS — E785 Hyperlipidemia, unspecified: Secondary | ICD-10-CM | POA: Insufficient documentation

## 2018-01-09 DIAGNOSIS — Z91040 Latex allergy status: Secondary | ICD-10-CM | POA: Insufficient documentation

## 2018-01-09 DIAGNOSIS — R112 Nausea with vomiting, unspecified: Secondary | ICD-10-CM | POA: Diagnosis not present

## 2018-01-09 DIAGNOSIS — N3000 Acute cystitis without hematuria: Secondary | ICD-10-CM | POA: Insufficient documentation

## 2018-01-09 LAB — COMPREHENSIVE METABOLIC PANEL
ALK PHOS: 63 U/L (ref 38–126)
ALT: 23 U/L (ref 0–44)
AST: 24 U/L (ref 15–41)
Albumin: 4.3 g/dL (ref 3.5–5.0)
Anion gap: 12 (ref 5–15)
BUN: 9 mg/dL (ref 6–20)
CALCIUM: 9 mg/dL (ref 8.9–10.3)
CHLORIDE: 106 mmol/L (ref 98–111)
CO2: 21 mmol/L — AB (ref 22–32)
CREATININE: 0.76 mg/dL (ref 0.44–1.00)
Glucose, Bld: 93 mg/dL (ref 70–99)
Potassium: 3.9 mmol/L (ref 3.5–5.1)
SODIUM: 139 mmol/L (ref 135–145)
Total Bilirubin: 1 mg/dL (ref 0.3–1.2)
Total Protein: 8.2 g/dL — ABNORMAL HIGH (ref 6.5–8.1)

## 2018-01-09 LAB — URINALYSIS, ROUTINE W REFLEX MICROSCOPIC
Bilirubin Urine: NEGATIVE
Glucose, UA: NEGATIVE mg/dL
KETONES UR: 20 mg/dL — AB
Nitrite: NEGATIVE
PROTEIN: NEGATIVE mg/dL
Specific Gravity, Urine: >1.046 — ABNORMAL HIGH (ref 1.005–1.030)
pH: 5 (ref 5.0–8.0)

## 2018-01-09 LAB — CBC
HEMATOCRIT: 44.9 % (ref 36.0–46.0)
Hemoglobin: 14.5 g/dL (ref 12.0–15.0)
MCH: 29.8 pg (ref 26.0–34.0)
MCHC: 32.3 g/dL (ref 30.0–36.0)
MCV: 92.4 fL (ref 80.0–100.0)
Platelets: 202 10*3/uL (ref 150–400)
RBC: 4.86 MIL/uL (ref 3.87–5.11)
RDW: 13.2 % (ref 11.5–15.5)
WBC: 10.1 10*3/uL (ref 4.0–10.5)
nRBC: 0 % (ref 0.0–0.2)

## 2018-01-09 LAB — I-STAT BETA HCG BLOOD, ED (MC, WL, AP ONLY)

## 2018-01-09 LAB — LIPASE, BLOOD: LIPASE: 23 U/L (ref 11–51)

## 2018-01-09 MED ORDER — IOPAMIDOL (ISOVUE-300) INJECTION 61%
INTRAVENOUS | Status: AC
  Administered 2018-01-09: 100 mL
  Filled 2018-01-09: qty 100

## 2018-01-09 MED ORDER — ONDANSETRON 4 MG PO TBDP: ORAL_TABLET | ORAL | 0 refills | Status: DC

## 2018-01-09 MED ORDER — DICYCLOMINE HCL 10 MG/ML IM SOLN
20.0000 mg | Freq: Once | INTRAMUSCULAR | Status: AC
  Administered 2018-01-09: 20 mg via INTRAMUSCULAR
  Filled 2018-01-09: qty 2

## 2018-01-09 MED ORDER — FLEET ENEMA 7-19 GM/118ML RE ENEM
1.0000 | ENEMA | Freq: Once | RECTAL | Status: AC
  Administered 2018-01-09: 1 via RECTAL
  Filled 2018-01-09: qty 1

## 2018-01-09 MED ORDER — HYDROCORTISONE 2.5 % RE CREA: 1.0000 "application " | TOPICAL_CREAM | Freq: Two times a day (BID) | RECTAL | 0 refills | Status: DC

## 2018-01-09 MED ORDER — SULFAMETHOXAZOLE-TRIMETHOPRIM 800-160 MG PO TABS: 1.0000 | ORAL_TABLET | Freq: Two times a day (BID) | ORAL | 0 refills | Status: AC

## 2018-01-09 MED ORDER — ONDANSETRON HCL 4 MG/2ML IJ SOLN
4.0000 mg | Freq: Once | INTRAMUSCULAR | Status: AC
  Administered 2018-01-09: 4 mg via INTRAVENOUS
  Filled 2018-01-09: qty 2

## 2018-01-09 MED ORDER — LIDOCAINE HCL URETHRAL/MUCOSAL 2 % EX GEL
1.0000 "application " | Freq: Once | CUTANEOUS | Status: AC
  Administered 2018-01-09: 1 via TOPICAL
  Filled 2018-01-09: qty 5

## 2018-01-09 MED ORDER — SODIUM CHLORIDE 0.9 % IV BOLUS
1000.0000 mL | Freq: Once | INTRAVENOUS | Status: AC
  Administered 2018-01-09: 1000 mL via INTRAVENOUS

## 2018-01-09 MED ORDER — SODIUM CHLORIDE (PF) 0.9 % IJ SOLN
INTRAMUSCULAR | Status: AC
  Filled 2018-01-09: qty 50

## 2018-01-09 NOTE — ED Notes (Signed)
Family at bedside. 

## 2018-01-09 NOTE — ED Notes (Signed)
Pt self administered the fleets enema and held it in for "a couple of minutes."

## 2018-01-09 NOTE — ED Notes (Signed)
Pt reports the pain in her rectum decreased after lidocaine

## 2018-01-09 NOTE — ED Provider Notes (Signed)
[facility]-EMERGENCY DEPT Provider Note   CSN: [id number] Arrival date & time: 01/09/18  1319     History   Chief Complaint Chief Complaint  Patient presents with  . Abdominal Pain    HPI Brooke [name] is a 42 y.o. female.  Brooke [name] is a 42 y.o. Female with fibromyalgia, chronic back pain, Chiari malformation, migraines, scoliosis, WPW and anxiety, who presents to the emergency department for evaluation of constipation and abdominal pain.  She reports that for the past 2 weeks she has been constipated and unable to have a bowel movement and has had worsening generalized abdominal pain associated with this.  She reports 2 days ago she was trying very hard to pass a bowel movement and straining and developed hemorrhoids, swollen and painful and she has been unable to improve swelling with over-the-counter occasions.  She has had a few episodes of bleeding from hemorrhoids when trying to have a bowel movement.  She reports this morning she started having some nausea and vomiting, denies hematemesis.  No fevers or chills.  She denies any urinary symptoms.  She reports she has been on narcotic pain medication for several years, is not on any bowel regimen, but is never had this much trouble with constipation before.  She has tried MiraLAX, Dulcolax and Colace without improvement, has not tried anything else to treat her symptoms.  She has seen Dr. Elnoria HowardHung with GI in the past for esophageal issues but does not have any when she sees regarding this constipation, and has not been evaluated in the past 2 weeks.     Past Medical History:  Diagnosis Date  . Allergic rhinitis   . Anxiety   . Chiari malformation   . DDD (degenerative disc disease), cervical   . DDD (degenerative disc disease), cervical   . Degenerative disc disease, lumbar   . Depression   . Fibromyalgia   . Hyperlipemia   . Migraine   . Scoliosis   . WPW (Wolff-Parkinson-White syndrome)      Patient Active Problem List   Diagnosis Date Noted  . Somnolence, daytime 11/22/2017  . Snoring 11/22/2017  . Chronic migraine 05/30/2016  . Polypharmacy 05/30/2016  . Atrophy of quadriceps femoris muscle 05/26/2016  . Patellar subluxation, left, sequela 05/26/2016  . Status migrainosus 08/21/2015  . Intractable pain 04/26/2015  . Hydronephrosis determined by ultrasound 04/26/2015  . Right flank pain   . Ureteral calculus, right 04/25/2015    Class: Acute  . Kidney stone 04/20/2015  . Obstruction of right ureteropelvic junction due to stone 04/20/2015  . UTI (lower urinary tract infection) 04/20/2015  . Fibromyalgia 04/20/2015  . Chronic pain disorder 04/20/2015  . Depression   . Anxiety   . Intractable chronic migraine without aura and without status migrainosus 03/26/2014  . Migraine 12/24/2013  . Persistent headaches 09/12/2012  . 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 Tobacco abuse 09/12/2012    Past Surgical History:  Procedure Laterality Date  . CESAREAN SECTION    . CYSTOSCOPY W/ URETERAL STENT PLACEMENT Right 04/25/2015   Procedure: CYSTOSCOPY WITH RETROGRADE PYELOGRAM/URETERAL STENT PLACEMENT;  Surgeon: Ihor GullyMark Ottelin, MD;  Location: [facility];  Service: Urology;  Laterality: Right;  . ears Bilateral   . ESOPHAGOGASTRODUODENOSCOPY (EGD) WITH PROPOFOL N/A 12/05/2017   Procedure: ESOPHAGOGASTRODUODENOSCOPY (EGD) WITH PROPOFOL;  Surgeon: Jeani HawkingHung, Patrick, MD;  Location: [facility];  Service: Endoscopy;  Laterality: N/A;  . EYE SURGERY Bilateral   . herneated bowel repair    . HERNIA REPAIR    .  HOLMIUM LASER APPLICATION Right 04/25/2015   Procedure: HOLMIUM LASER APPLICATION;  Surgeon: Ihor Gully, MD;  Location: [facility];  Service: Urology;  Laterality: Right;  . KNEE SURGERY Bilateral    Right Knee x 1 - Left x3  . SAVORY DILATION N/A 12/05/2017   Procedure: SAVORY DILATION;  Surgeon: Jeani Hawking, MD;  Location: [facility];  Service: Endoscopy;  Laterality: N/A;  . TUBAL  LIGATION    . vein removed Left    arm     OB History   None      Home Medications    Prior to Admission medications   Medication Sig Start Date End Date Taking? Authorizing Provider  baclofen (LIORESAL) 10 MG tablet Take 1 tablet (10 mg total) by mouth 2 (two) times daily. Patient taking differently: Take 10 mg by mouth 3 (three) times daily.  02/16/17  Yes Neese, Hope M, NP  butalbital-acetaminophen-caffeine (FIORICET, ESGIC) [phone] MG tablet Take one tablet every 8 hours as needed for migraine.  No more than 12 tabs per month.  No early refills. 11/01/17  Yes Levert Feinstein, MD  diphenhydrAMINE (BENADRYL) 25 mg capsule Take 25 mg by mouth daily as needed for allergies.   Yes [provider]  Erenumab-aooe (AIMOVIG) 70 MG/ML SOAJ Inject 70 mg into the skin every 30 (thirty) days. 11/01/17  Yes Levert Feinstein, MD  levonorgestrel (MIRENA) 20 MCG/24HR IUD 1 each by Intrauterine route once.   Yes [provider]  loratadine (CLARITIN) 10 MG tablet Take 10 mg by mouth daily.   Yes [provider]  Multiple Vitamins-Minerals (MULTIVITAMIN WITH MINERALS) tablet Take 1 tablet by mouth daily.   Yes [provider]  Oxycodone HCl 10 MG TABS Take 10 mg by mouth 5 (five) times daily. Take one tablet five times daily.    Yes [provider]  hydrocortisone (ANUSOL-HC) 2.5 % rectal cream Place 1 application rectally 2 (two) times daily. 01/09/18   Dartha Lodge, PA-C  ondansetron (ZOFRAN ODT) 4 MG disintegrating tablet 4mg  ODT q4 hours prn nausea/vomit 01/09/18   Dartha Lodge, PA-C  sulfamethoxazole-trimethoprim (BACTRIM DS,SEPTRA DS) 800-160 MG tablet Take 1 tablet by mouth 2 (two) times daily for 7 days. 01/09/18 01/16/18  Dartha Lodge, PA-C  SUMAtriptan (IMITREX) 6 MG/0.5ML SOLN injection Inject 0.5 mLs (6 mg total) into the skin every 2 (two) hours as needed for migraine or headache. May repeat in 2 hours if headache persists or recurs. Patient not  taking: Reported on 01/09/2018 04/26/17   Levert Feinstein, MD  tiZANidine (ZANAFLEX) 4 MG tablet Take 1 tablet (4 mg total) by mouth every 6 (six) hours as needed for muscle spasms. Patient not taking: Reported on 01/09/2018 08/23/17   Levert Feinstein, MD    Family History Family History  Problem Relation Age of Onset  . Migraines Mother   . CAD Father   . Migraines Daughter     Social History Social History   Tobacco Use  . Smoking status: Current Every Day Smoker    Packs/day: 0.25    Types: Cigarettes  . Smokeless tobacco: Never Used  Substance Use Topics  . Alcohol use: No    Alcohol/week: 0.0 standard drinks  . Drug use: No     Allergies   Digoxin and related; Toradol [ketorolac tromethamine]; Latex; Other; Ajovy [fremanezumab-vfrm]; and Tape   Review of Systems Review of Systems  Constitutional: Negative for chills and fever.  HENT: Negative.   Eyes: Negative for visual disturbance.  Respiratory: Negative for cough and shortness of breath.   Cardiovascular: Negative for chest pain.  Gastrointestinal: Positive for abdominal pain, anal bleeding, constipation, nausea, rectal pain and vomiting. Negative for diarrhea.  Genitourinary: Negative for dysuria, flank pain, frequency, hematuria, vaginal bleeding and vaginal discharge.  Musculoskeletal: Negative for arthralgias and myalgias.  Skin: Negative for color change and rash.  Neurological: Negative for dizziness, syncope and light-headedness.     Physical Exam Updated Vital Signs BP 100/71 (BP Location: Right Arm)   Pulse 90   Temp 98.8 F (37.1 C) (Oral)   Resp 16   SpO2 99%   Physical Exam  Constitutional: She is oriented to person, place, and time. She appears well-developed and well-nourished. She does not appear ill.  Appears uncomfortable but is not in acute distress  HENT:  Head: Normocephalic and atraumatic.  Mouth/Throat: Oropharynx is clear and moist.  Eyes: Right eye exhibits no discharge. Left eye  exhibits no discharge.  Cardiovascular: Normal rate, regular rhythm, normal heart sounds and intact distal pulses. Exam reveals no gallop and no friction rub.  No murmur heard. Pulmonary/Chest: Effort normal and breath sounds normal. No respiratory distress.  Respirations equal and unlabored, patient able to speak in full sentences, lungs clear to auscultation bilaterally  Abdominal: Normal appearance. There is generalized tenderness. There is guarding.  Abdomen is soft, minimally distended, bowel sounds are present throughout, there is generalized tenderness with guarding throughout the abdomen, no rigidity or rebound tenderness, no CVA tenderness bilaterally  Genitourinary:  Genitourinary Comments: Rectal exam reveals 3 small external hemorrhoids, palpable hard stool within the rectal vault, but no clear impaction, patient was very uncomfortable throughout rectal exam, did not tolerate any attempted manual disimpaction.  Neurological: She is alert and oriented to person, place, and time. Coordination normal.  Skin: Skin is warm and dry. Capillary refill takes less than 2 seconds. She is not diaphoretic.  Psychiatric: She has a normal mood and affect. Her behavior is normal.  Nursing note and vitals reviewed.    ED Treatments / Results  Labs (all labs ordered are listed, but only abnormal results are displayed) Labs Reviewed  COMPREHENSIVE METABOLIC PANEL - Abnormal; Notable for the following components:      Result Value   CO2 21 (*)    Total Protein 8.2 (*)    All other components within normal limits  URINALYSIS, ROUTINE W REFLEX MICROSCOPIC - Abnormal; Notable for the following components:   Specific Gravity, Urine >1.046 (*)    Hgb urine dipstick MODERATE (*)    Ketones, ur 20 (*)    Leukocytes, UA LARGE (*)    Bacteria, UA RARE (*)    All other components within normal limits  LIPASE, BLOOD  CBC  I-STAT BETA HCG BLOOD, ED ([facility], [facility], [facility] ONLY)    EKG None  Radiology Ct  Abdomen Pelvis W Contrast  Result Date: 01/09/2018 CLINICAL DATA:  Abdominal pain and constipation.  Vomiting. EXAM: CT ABDOMEN AND PELVIS WITH CONTRAST TECHNIQUE: Multidetector CT imaging of the abdomen and pelvis was performed using the standard protocol following bolus administration of intravenous contrast. CONTRAST:  100 mL ISOVUE-300 IOPAMIDOL (ISOVUE-300) INJECTION 61% COMPARISON:  Jul 18, 2015 FINDINGS: Lower chest: Lung bases are clear. Hepatobiliary: There is a hemangioma along the periphery of the right lobe of the liver measuring 1.8 x 1.1 cm, also present previously. There is mild fatty infiltration near the fissure for the ligamentum teres. There is a presumed granuloma in the right lobe of the liver,  midportion. Gallbladder is absent. There is no appreciable biliary duct dilatation. Pancreas: There is no evident pancreatic mass or inflammatory focus. Spleen: No splenic lesions are apparent. Adrenals/Urinary Tract: Adrenals bilaterally appear normal. Kidneys bilaterally show no evident mass or hydronephrosis on either side. There is no evident renal or ureteral calculus on either side. Urinary bladder is midline with wall thickness within normal limits. Stomach/Bowel: Rectum is distended with stool. Rectal wall does not appear significantly thickened. There is no perirectal stranding. There is moderate stool elsewhere in the colon. There is no appreciable bowel wall or mesenteric thickening. There is no evident bowel obstruction. There is no free air or portal venous air. Vascular/Lymphatic: There is aortic atherosclerosis. No aneurysm evident. Major mesenteric arterial vessels appear patent. There is no adenopathy in the abdomen or pelvis. Reproductive: Uterus is anteverted. Intrauterine device is positioned within the endometrium. There is no pelvic mass evident. Other: Appendix appears normal. There is no evident abscess or ascites in the abdomen or pelvis. Musculoskeletal: There are no blastic  or lytic bone lesions. There is mild lumbar levoscoliosis. No intramuscular lesions are evident. IMPRESSION: 1. No evident bowel obstruction. Note that rectum focally is distended with stool. Moderate stool elsewhere in colon. 2.  No abscess in the abdomen or pelvis.  Appendix appears normal. 3.  No evident renal or ureteral calculus.  No hydronephrosis. 4.  Intrauterine device positioned within the endometrium. 5.  Gallbladder absent. 6.  There is aortic atherosclerosis. Aortic Atherosclerosis (ICD10-I70.0). Electronically Signed   By: Bretta Bang III M.D.   On: 01/09/2018 18:24     Procedures Procedures (including critical care time)  Medications Ordered in ED Medications  sodium chloride 0.9 % bolus 1,000 mL (0 mLs Intravenous Stopped 01/09/18 1736)  ondansetron (ZOFRAN) injection 4 mg (4 mg Intravenous Given 01/09/18 1637)  dicyclomine (BENTYL) injection 20 mg (20 mg Intramuscular Given 01/09/18 1704)  iopamidol (ISOVUE-300) 61 % injection (100 mLs  Contrast Given 01/09/18 1743)  sodium phosphate (FLEET) 7-19 GM/118ML enema 1 enema (1 enema Rectal Given 01/09/18 1945)  lidocaine (XYLOCAINE) 2 % jelly 1 application (1 application Topical Given 01/09/18 1919)     Initial Impression / Assessment and Plan / ED Course  I have reviewed the triage vital signs and the nursing notes.  Pertinent labs & imaging results that were available during my care of the patient were reviewed by me and considered in my medical decision making (see chart for details).  Patient presents for evaluation of generalized abdominal pain, reports 2 weeks of persistent constipation, she has been straining to try and have bowel movement now has hemorrhoids, and has had a few short episodes of bleeding while trying to have a BM.  On exam patient has normal vitals and appears uncomfortable but in no acute distress.  Abdomen is mildly distended with generalized tenderness and guarding, no focal area of pain.  Rectal  exam reveals a few small external hemorrhoids, they do not appear to be thrombosed, rectal exam is extremely painful for patient, palpable hard stool but patient did not tolerate any disimpaction.  Constipation could be due to patient recently stopping her fibromyalgia medications, as well as being on chronic narcotics although patient reports she has never had issues with severe constipation before.  Will get abdominal labs and CT abdomen pelvis to rule out obstruction, diverticulitis or other cause for acute constipation.  Labs overall reassuring, no leukocytosis, despite few episodes of rectal bleeding hemoglobin is 14.5, no acute electrolyte derangements, normal renal  and liver function, normal lipase, negative pregnancy, urinalysis does show large leukocytes with 21-50 WBCs and bacteria present, patient does report history of UTIs, will plan to treat with Keflex.  CT shows no evidence of bowel obstruction, rectum is focally distended with stool and there is moderate stool elsewhere in the colon.  No abscess or infectious process noted in the abdomen, no renal calculi or hydronephrosis in the setting of UTI.  IUD is in normal position, surgically absent gallbladder.  Given patient's level of discomfort and distended rectum noted on CT we will give fleets enema here in the emergency department.  After enema patient was able to pass small amount of stool reports some improvement in her discomfort, at this time I feel she is stable for discharge home we will have her complete MiraLAX cleanout, will send home with Anusol for hemorrhoids, nausea medication and Bactrim for UTI as patient reports severe yeast infections with Keflex.  Will have patient follow-up with primary care and GI.  Strict return precautions discussed.  Patient expresses understanding and is in agreement with this plan.  Final Clinical Impressions(s) / ED Diagnoses   Final diagnoses:  Constipation, unspecified constipation type   Generalized abdominal pain  Non-intractable vomiting with nausea, unspecified vomiting type  Hemorrhoids, unspecified hemorrhoid type  Acute cystitis without hematuria    ED Discharge Orders         Ordered    sulfamethoxazole-trimethoprim (BACTRIM DS,SEPTRA DS) 800-160 MG tablet  2 times daily     01/09/18 2023    ondansetron (ZOFRAN ODT) 4 MG disintegrating tablet     01/09/18 2023    hydrocortisone (ANUSOL-HC) 2.5 % rectal cream  2 times daily     01/09/18 2023           Legrand Rams 01/12/18 0901    Azalia Bilis, MD 01/12/18 1555

## 2018-01-09 NOTE — Discharge Instructions (Signed)
Take antibiotics as directed for urinary tract infection.  Your CT scan showed no evidence of obstruction or diverticulitis, these use 8 capfuls of MiraLAX in 32 ounces of Gatorade and drink this over a few hours, and then continue using 1-2 capfuls of MiraLAX daily to help keep things moving, I hope that this will help with your constipation now that you have been able to pass some stool here in the emergency department.  Use Anusol to help with hemorrhoids.  If you have worsening bleeding return to the emergency department.  It is very important that you follow-up with your GI doctor especially if constipation is not resolving.  You may use Zofran as needed for nausea.  Make sure you are drinking plenty of fluids.

## 2018-01-09 NOTE — ED Notes (Signed)
Pt notified chart IV will be started using the ultrasound. Matt Quick RN introduced to pt.

## 2018-01-09 NOTE — ED Triage Notes (Signed)
Per EMS, pt complains of abdominal pain and constipation x 2 weeks. Pt developed hemorrhoids yesterday. Pt has not eaten for the past few days. Pt vomiting this morning at ~3AM.   BP 138/64 HR 88 RR 16 CBG 94

## 2018-03-14 ENCOUNTER — Other Ambulatory Visit: Payer: Self-pay | Admitting: Neurology

## 2018-05-06 ENCOUNTER — Other Ambulatory Visit: Payer: Self-pay | Admitting: Neurology

## 2018-05-15 ENCOUNTER — Telehealth: Payer: Self-pay | Admitting: Neurology

## 2018-05-15 NOTE — Telephone Encounter (Signed)
Pt. States she needs her medications refilled and would like to have a virtual visit.

## 2018-05-15 NOTE — Telephone Encounter (Signed)
cancel

## 2018-05-16 ENCOUNTER — Encounter: Payer: Self-pay | Admitting: Neurology

## 2018-05-16 ENCOUNTER — Ambulatory Visit (INDEPENDENT_AMBULATORY_CARE_PROVIDER_SITE_OTHER): Payer: Medicaid Other | Admitting: Neurology

## 2018-05-16 DIAGNOSIS — G43719 Chronic migraine without aura, intractable, without status migrainosus: Secondary | ICD-10-CM | POA: Diagnosis not present

## 2018-05-16 MED ORDER — ERENUMAB-AOOE 140 MG/ML ~~LOC~~ SOAJ: 140.0000 mg | SUBCUTANEOUS | 11 refills | Status: DC

## 2018-05-16 MED ORDER — TIZANIDINE HCL 4 MG PO TABS: 4.0000 mg | ORAL_TABLET | Freq: Four times a day (QID) | ORAL | 6 refills | Status: DC | PRN

## 2018-05-16 MED ORDER — BUTALBITAL-APAP-CAFFEINE 50-325-40 MG PO TABS: ORAL_TABLET | ORAL | 5 refills | Status: DC

## 2018-05-16 MED ORDER — ZOLMITRIPTAN 5 MG PO TABS: 5.0000 mg | ORAL_TABLET | ORAL | 6 refills | Status: DC | PRN

## 2018-05-16 NOTE — Telephone Encounter (Signed)
She has been scheduled for a video meeting today at 10:30am.

## 2018-05-16 NOTE — Progress Notes (Signed)
[facility]  PATIENT: Brooke [name] DOB: Dec 20, 1975   HISTORY OF PRESENT ILLNESS: Brooke [name] a 43 year old right-handed female, referred by her primary care physician Dr. Julio Sicks for evaluation of diffuse body achy pain, migraine  She carries a diagnosis of chronic migraine since 1983,, fibromyalgia since 2011, she complains of chronic neck pain, low back pain, joints pain, radiating pain to her left arm,  She moved from [place] to [place] in November 2013, has been under the [facility] pain management. Over the years, she has tried different medications,   Previously she has tried and failed Topamax, trigger point injection, which actually made her headache worse while she was in [place], she reported frequent emergency room visit, 3 times each month, receiving Dilaudid, She was also treated with Opana, fentanyl patch, Demerol in the past. she is now taking Norco,from her pain management  She reported long-standing history of migraine, her typical migraine are right retro-orbital area severe pounding headache with associated light noise sensitivity, resting helps, lasting few hours,  She has tried Fioricet, Flexeril, Norco,without help, previously she has tried different triptans, including Imitrex, Zomig, Maxalt, without helping,  She has tried and failed preventive medications Topamax, Depakote, Inderal, nortriptyline, Flexeril,  Has tried different abortive treatment, including Imitrex injection, sometimes make her headache worse, Zomig, Maxalt, Imitrex tablets,   Trigger for her headaches are environmental allergy, certain food, such as spicy food, acidic food,  She complains of few years history of bilateral feet parestheisa.increased after bearing weight , reported abnormal EMG nerve conduction study from outside facility in May 2015 consistent with peripheral neuropathy  We have reviewed MRI of cervical, and lumbar spine at [facility]  health in June 2015, there was evidence of multilevel degenerative disc disease, there was no significant canal, or foraminal stenosis  She is now currently taking Nucynta ER 150 mg every day for chronic migraine headache, she also daily Excedrin Migraine for her headaches, up to 4 tablets each day,   Migraine Frequency: daily since 2015, increased frequency and severity since Nucynta ER dosage was increased from 100 to 150mg  dose was increased in June 2017. She has severe headache was associated light, noise sensitivity, had to stay in dark quiet room, have her sunglasses even indoor.  Migraine Last from 5 hours to 12 hours.  UPDATE May 30 2016: We have discussed about Botox injection at her last visit in June 2017, but it never followed through, She reported more than 25 days of migraine in one month, this has been ongoing since last 10 years, She has been on chronic narcotics treatment, recently had major change of her pain medications, she is now taking percocet 10/325mg 4 tablets a day,since February 2018, she is still on long-acting narcotics Embeda50/2 mg 1 capsule daily, he is also taking clonazepam 1 mg twice a day, BuSpar 15 mg twice a day, baclofen 10 mg twice a day, Lyrica 75 mg twice a day,  She lying in the dark trying to help her pain, she end up spending most of her time in bed,she lives with her family, she also smokes, is applying for disability.  She complains 8 out of 10 bilateral frontal area throbbing headache with associated light noise sensitivity, and times with associated nausea, dizziness, lasting hours to up to one week.  Update November 28th 2018 About 6 years ago in 2012, she had multiple Botox injections every 3 months at [place], responded very well, She is now complains of daily moderate to severe migraine headaches, bilateral  frontal, parietal region, at least 6 out of 10, on polypharmacy treatment, Excedrin Migraine daily for migraine headaches,   Previously she has tried and failed multiple preventive medications, Topamax and Depakote antiepileptic medications Inderal beta blocker Nortriptyline tricyclic antidepressant Flexeril muscle relaxant She has failed Zomig Relpax and Imitrex acutely which are triptans  UPDATE August 03 2017:YY She complains 25/30 days of headaches, has to come to the office multiple times for iv infusion, report 10% improvement with BOTOX injection. ng on laste any wehere 12 hours aday,   aimovig injeciton has helped her, less the severity she is not curled ou pint he ball cryings,    Observations/Objective: I have reviewed problem lists, medications, allergie TELEVISIT On May 16 2018:  Virtual Visit via Video  I connected with Brooke [name] on 05/16/18 at  by Video and verified that I am speaking with the correct person using two identifiers.   I discussed the limitations, risks, security and privacy concerns of performing an evaluation and management service by Video and the availability of in person appointments. I also discussed with the patient that there may be a patient responsible charge related to this service. The patient expressed understanding and agreed to proceed.  History of Present Illness: She complain of increased headache at the base of her head, it happens on a daily basis, spreading forward to become holoacranial moderate to severe headaches, she staying dark, dealing with her headache on a daily basis, is on polypharmacy treatment, is on oxycodone 10 mg 5 times a day from her pain management for her diagnosis of fibromyalgia, low back pain, is referred for spinal cord stimulator,  She also reported that she is diagnosed with severe allergy, is starting treatment soon,  I personally reviewed MRI of the brain on December 16, 2018, single isolated right frontal subcortical gliosis, no acute abnormality.  For abortive treatment she has previously tried Maxalt, Imitrex, Relpax with no  significant help, she is taking oxycodone, and Fioricet as needed was helpful   Assessment and Plan: Chronic daily migraine  Increase Aimovig from 70 to  140 mg every month as preventive medications  Zomig 5 mg as needed, may mix with tizanidine, Benadryl, heating pad, Zofran as a migraine cocktail,  I provided 25 minutes of non-face-to-face time during this encounter.       Levert Feinstein, M.D. Ph.D.  [facility] [address] [place], [place] [address] Phone: [phone] Fax:      [phone]

## 2018-05-17 ENCOUNTER — Ambulatory Visit: Payer: Self-pay | Admitting: Neurology

## 2018-05-17 ENCOUNTER — Encounter

## 2018-05-17 ENCOUNTER — Telehealth: Payer: Self-pay | Admitting: *Deleted

## 2018-05-17 NOTE — Telephone Encounter (Signed)
Submitted PA Zolmitriptan on [facility]. Confirmation #:[id number] W. Waiting on determination.

## 2018-07-30 ENCOUNTER — Telehealth: Payer: Self-pay | Admitting: Neurology

## 2018-07-30 MED ORDER — UBROGEPANT 50 MG PO TABS: 50.0000 mg | ORAL_TABLET | ORAL | 11 refills | Status: DC | PRN

## 2018-07-30 NOTE — Telephone Encounter (Signed)
Called patient and advised her of new prescription Dr Krista Blue sent in. She verbalized understanding, appreciation.

## 2018-07-30 NOTE — Telephone Encounter (Addendum)
Called patient who continues to take Aimovig and breakthrough medications, and they were helping with migraines until she began immunotherapy for multiple allergies. She began immunotherapy on 05/23/18, taking injections every other day. The injections cause bad migraines with nausea. She will be on it, taking injections every other day x 1 year.  She is asking if Alvie Heidelberg is a good alternative treatment. I advised will send note to Dr Krista Blue, and she'll get a call back. Patient verbalized understanding, appreciation.

## 2018-07-30 NOTE — Telephone Encounter (Signed)
Pt is asking for a call to discuss coming off of zolmitriptan (ZOMIG) 5 MG tablet and going on Ubrelvy. Please call

## 2018-07-30 NOTE — Addendum Note (Signed)
Addended by: Marcial Pacas on: 07/30/2018 03:58 PM   Modules accepted: Orders

## 2018-07-30 NOTE — Telephone Encounter (Signed)
Please let patient know that I have called in Ubrelvy 50mg  as needed to her. 12 tabs with 11 refills.

## 2018-08-29 ENCOUNTER — Other Ambulatory Visit: Payer: Self-pay | Admitting: *Deleted

## 2018-08-29 MED ORDER — ONDANSETRON 4 MG PO TBDP: ORAL_TABLET | ORAL | 5 refills | Status: AC

## 2018-09-27 ENCOUNTER — Telehealth: Payer: Self-pay | Admitting: Neurology

## 2018-09-27 NOTE — Telephone Encounter (Signed)
Pt states that since she woke up this morning she heard a bone crushing sound in her head and since awake she has not been able to move her neck due to her neck being locked up. Pt asked if she should go to the ED.  Pt was advised if she feels the need to go she is encouraged to go for urgent care.  Pt would still like a call from Dr Krista Blue

## 2018-09-27 NOTE — Telephone Encounter (Signed)
Agree for her to go to urgent care, I have called, and left message for patient

## 2018-10-04 ENCOUNTER — Other Ambulatory Visit: Payer: Self-pay | Admitting: Neurology

## 2018-10-31 ENCOUNTER — Other Ambulatory Visit: Payer: Self-pay | Admitting: Neurology

## 2018-11-05 ENCOUNTER — Other Ambulatory Visit: Payer: Self-pay

## 2018-11-05 MED ORDER — BUTALBITAL-APAP-CAFFEINE 50-325-40 MG PO TABS: ORAL_TABLET | ORAL | 1 refills | Status: DC

## 2018-11-05 NOTE — Telephone Encounter (Addendum)
I called the pharmacy and she received refills on the followings dates:  3/25, 4/21, 5/18, 6/13, 7/19 and 8/15.  One month refill x 1 sent to MD for approval.  She will need to keep her pending appt on 12/11/2018.  The patient is aware.

## 2018-11-05 NOTE — Addendum Note (Signed)
Addended by: Noberto Retort C on: 11/05/2018 05:00 PM   Modules accepted: Orders

## 2018-11-05 NOTE — Telephone Encounter (Signed)
Pt call about her floricet being denied and that she needed an appt. I schedule her with Judson Roch NP on 10/20 which was her first available.I stated message will be sent to Sarah NP or nurse on whether a temporary refill can be done until appt. I also change the address at the request of the pt.I advise her mask is required, no kids and one visitor.

## 2018-12-10 NOTE — Progress Notes (Signed)
PATIENT: Brooke [name] DOB: [birth year]  REASON FOR VISIT: follow up HISTORY FROM: patient  HISTORY OF PRESENT ILLNESS: Today 12/11/18  HISTORY  HISTORY OF PRESENT ILLNESS: Brooke Spradlinis a 43 year old right-handed female, referred by her primary care physician Dr. Julio Sicks for evaluation of diffuse body achy pain, migraine  She carries a diagnosis of chronic migraine since 1983,, fibromyalgia since 2011, she complains of chronic neck pain, low back pain, joints pain, radiating pain to her left arm,  She moved from [place] to [place] in November 2013, has been under the [facility] pain management. Over the years, she has tried different medications,   Previously she has tried and failed Topamax, trigger point injection, which actually made her headache worse while she was in [place], she reported frequent emergency room visit, 3 times each month, receiving Dilaudid, She was also treated with Opana, fentanyl patch, Demerol in the past. she is now taking Norco,from her pain management  She reported long-standing history of migraine, her typical migraine are right retro-orbital area severe pounding headache with associated light noise sensitivity, resting helps, lasting few hours,  She has tried Fioricet, Flexeril, Norco,without help, previously she has tried different triptans, including Imitrex, Zomig, Maxalt, without helping,  She has tried and failed preventive medications Topamax, Depakote, Inderal, nortriptyline, Flexeril,  Has tried different abortive treatment, including Imitrex injection, sometimes make her headache worse, Zomig, Maxalt, Imitrex tablets,   Trigger for her headaches are environmental allergy, certain food, such as spicy food, acidic food,  She complains of few years history of bilateral feet parestheisa.increased after bearing weight , reported abnormal EMG nerve conduction study from outside facility in May 2015 consistent with peripheral  neuropathy  We have reviewed MRI of cervical, and lumbar spine at [facility] in June 2015, there was evidence of multilevel degenerative disc disease, there was no significant canal, or foraminal stenosis  She is now currently taking Nucynta ER 150 mg every day for chronic migraine headache, she also daily Excedrin Migraine for her headaches, up to 4 tablets each day,   Migraine Frequency: daily since 2015, increased frequency and severity since Nucynta ER dosage was increased from 100 to  dose was increased in June 2017. She has severe headache was associated light, noise sensitivity, had to stay in dark quiet room, have her sunglasses even indoor.  Migraine Last from 5 hours to 12 hours.  UPDATE May 30 2016: We have discussed about Botox injection at her last visit in June 2017, but it never followed through, She reported more than 25 days of migraine in one month, this has been ongoing since last 10 years, She has been on chronic narcotics treatment, recently had major change of her pain medications, she is now taking percocet 10/325mg 4 tablets a day,since February 2018, she is still on long-acting narcotics Embeda50/2 mg 1 capsule daily, he is also taking clonazepam 1 mg twice a day, BuSpar 15 mg twice a day, baclofen 10 mg twice a day, Lyrica 75 mg twice a day,  She lying in the dark trying to help her pain, she end up spending most of her time in bed,she lives with her family, she also smokes, is applying for disability.  She complains 8 out of 10 bilateral frontal area throbbing headache with associated light noise sensitivity, and times with associated nausea, dizziness, lasting hours to up to one week.  Update November 28th 2018 About 6 years ago in 2012, she had multiple Botox injections every 3 months at [place], responded  very well, She is now complains of daily moderate to severe migraine headaches, bilateral frontal, parietal region, at least 6 out of 10, on  polypharmacy treatment, Excedrin Migraine daily for migraine headaches,  Previously she has tried and failed multiple preventive medications, Topamax and Depakote antiepileptic medications Inderal beta blocker Nortriptyline tricyclic antidepressant Flexeril muscle relaxant She has failed Zomig Relpax and Imitrex acutely which are triptans  UPDATE August 03 2017:YY She complains 25/30 days of headaches, has to come to the office multiple times for iv infusion, report 10% improvement with BOTOX injection. ng on laste any wehere 12 hours aday,   aimovig injeciton has helped her, less the severity she is not curled ou pint he ball cryings,   Update December 11, 2018 SS: Here today for follow-up on her migraines.  The increase in Aimovig 140 mg, has been beneficial.  She still complains of daily migraine headache, but headache used to occur 3 times a day.  She has found the Ubrelvy to be helpful for her headaches if she is able to lie down, otherwise, Fioricet has good benefit and will relieve her headache.  She has tried Botox in the past without benefit.  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  She is requesting referral to an ENT specialist.  She does see pain management for chronic pain.  She says she is now has less stress in her life now that she is out of a bad relationship.  She is on disability, she does drive a car, she is now living with family.  REVIEW OF SYSTEMS: Out of a complete 14 system review of symptoms, the patient complains only of the following symptoms, and all other reviewed systems are negative.  Headache  ALLERGIES: Allergies  Allergen Reactions  . Digoxin Nausea And Vomiting    Elevated 'white count'  . Digoxin And Related Nausea And Vomiting    Elevated 'white count'  . Toradol [Ketorolac Tromethamine] Other (See Comments)     Uncontrollable 'shakes'  . Latex Hives and Swelling  . Other Other (See Comments)    Any nasal sprays cause serious migranes  . Ajovy [Fremanezumab-Vfrm] Rash  . Tape Itching and Rash    Please use "paper" tape Please use "paper" tape    HOME MEDICATIONS: Outpatient Medications Prior to Visit  Medication Sig Dispense Refill  . baclofen (LIORESAL) 10 MG tablet Take 1 tablet (10 mg total) by mouth 2 (two) times daily. (Patient taking differently: Take 10 mg by mouth 3 (three) times daily. ) 20 each 0  . butalbital-acetaminophen-caffeine (FIORICET) 50-325-40 MG tablet TAKE 1 TABLET BY MOUTH EVERY 8 HOURS AS NEEDED FOR MIGRAINE( NO MORE THAN 12 TABLETS PER MONTH); NO EARLY REFILLS 12 tablet 1  . diphenhydrAMINE (BENADRYL) 25 mg capsule Take 25 mg by mouth daily as needed for allergies.    Dorise Hiss (AIMOVIG) 140 MG/ML SOAJ Inject 140 mg into the skin every 30 (thirty) days. 1 pen 11  . hydrocortisone (ANUSOL-HC) 2.5 % rectal cream Place 1 application rectally 2 (two) times daily. 30 g 0  . hydrOXYzine (VISTARIL) 100 MG capsule Take 100 mg by mouth 3 (three) times daily as needed for itching.    Marland Kitchen levonorgestrel (MIRENA) 20 MCG/24HR IUD 1 each by Intrauterine route once.    . loratadine (CLARITIN) 10 MG tablet Take 10 mg by mouth daily.    Marland Kitchen  MELOXICAM PO Take by mouth daily.    . Multiple Vitamins-Minerals (MULTIVITAMIN WITH MINERALS) tablet Take 1 tablet by mouth daily.    . ondansetron (ZOFRAN ODT) 4 MG disintegrating tablet 4mg  ODT q4 hours prn nausea/vomit 10 tablet 5  . Oxycodone HCl 10 MG TABS Take 10 mg by mouth 5 (five) times daily. Take one tablet five times daily.     . pregabalin (LYRICA) 75 MG capsule Take by mouth 3 (three) times daily.    Marland Kitchen. tiZANidine (ZANAFLEX) 4 MG tablet Take 1 tablet (4 mg total) by mouth every 6 (six) hours as needed for muscle spasms. 30 tablet 6  . Ubrogepant (UBRELVY) 50 MG TABS Take 50 mg by mouth as needed. 12 tablet 11  . UNABLE TO FIND Med  Name: Allergy injections. (Does herself.)    . zolmitriptan (ZOMIG) 5 MG tablet Take 1 tablet (5 mg total) by mouth as needed for migraine. As need, 2 hours apart, maximum 2 tabs in 24 hours. 12 tablet 6   No facility-administered medications prior to visit.     PAST MEDICAL HISTORY: Past Medical History:  Diagnosis Date  . Allergic rhinitis   . Anxiety   . Chiari malformation   . DDD (degenerative disc disease), cervical   . DDD (degenerative disc disease), cervical   . Degenerative disc disease, lumbar   . Depression   . Fibromyalgia   . Hyperlipemia   . Migraine   . Scoliosis   . WPW (Wolff-Parkinson-White syndrome)     PAST SURGICAL HISTORY: Past Surgical History:  Procedure Laterality Date  . CESAREAN SECTION    . CYSTOSCOPY W/ URETERAL STENT PLACEMENT Right 04/25/2015   Procedure: CYSTOSCOPY WITH RETROGRADE PYELOGRAM/URETERAL STENT PLACEMENT;  Surgeon: Ihor GullyMark Ottelin, MD;  Location: [facility];  Service: Urology;  Laterality: Right;  . ears Bilateral   . ESOPHAGOGASTRODUODENOSCOPY (EGD) WITH PROPOFOL N/A 12/05/2017   Procedure: ESOPHAGOGASTRODUODENOSCOPY (EGD) WITH PROPOFOL;  Surgeon: Jeani HawkingHung, Patrick, MD;  Location: [facility];  Service: Endoscopy;  Laterality: N/A;  . EYE SURGERY Bilateral   . herneated bowel repair    . HERNIA REPAIR    . HOLMIUM LASER APPLICATION Right 04/25/2015   Procedure: HOLMIUM LASER APPLICATION;  Surgeon: Ihor GullyMark Ottelin, MD;  Location: [facility];  Service: Urology;  Laterality: Right;  . KNEE SURGERY Bilateral    Right Knee x 1 - Left x3  . SAVORY DILATION N/A 12/05/2017   Procedure: SAVORY DILATION;  Surgeon: Jeani HawkingHung, Patrick, MD;  Location: [facility];  Service: Endoscopy;  Laterality: N/A;  . TUBAL LIGATION    . vein removed Left    arm    FAMILY HISTORY: Family History  Problem Relation Age of Onset  . Migraines Mother   . CAD Father   . Migraines Daughter     SOCIAL HISTORY: Social History   Socioeconomic History  . Marital status:  Divorced    Spouse name: Not on file  . Number of children: 2  . Years of education: 3112  . Highest education level: Not on file  Occupational History    Comment: Not working  Social Needs  . Financial resource strain: Not on file  . Food insecurity    Worry: Not on file    Inability: Not on file  . Transportation needs    Medical: Not on file    Non-medical: Not on file  Tobacco Use  . Smoking status: Current Every Day Smoker    Packs/day: 0.25    Types: Cigarettes  .  Smokeless tobacco: Never Used  Substance and Sexual Activity  . Alcohol use: No    Alcohol/week: 0.0 standard drinks  . Drug use: No  . Sexual activity: Not on file  Lifestyle  . Physical activity    Days per week: Not on file    Minutes per session: Not on file  . Stress: Not on file  Relationships  . Social Herbalist on phone: Not on file    Gets together: Not on file    Attends religious service: Not on file    Active member of club or organization: Not on file    Attends meetings of clubs or organizations: Not on file    Relationship status: Not on file  . Intimate partner violence    Fear of current or ex partner: Not on file    Emotionally abused: Not on file    Physically abused: Not on file    Forced sexual activity: Not on file  Other Topics Concern  . Not on file  Social History Narrative   Patient lives at home with her two children and her mother.   Unemployed. - Patient trying to get social security.   Education high school.   Right handed.   Caffeine soda's four soda's daily.   PHYSICAL EXAM  Vitals:   12/11/18 1336  BP: 130/88  Pulse: 82  Temp: 98 F (36.7 C)  Height: 5\' 4"  (1.626 m)   Body mass index is 31.24 kg/m.  Generalized: Well developed, in no acute distress   Neurological examination  Mentation: Alert oriented to time, place, history taking. Follows all commands speech and language fluent Cranial nerve II-XII: Pupils were equal round reactive to  light. Extraocular movements were full, visual field were full on confrontational test. Facial sensation and strength were normal.  Head turning and shoulder shrug  were normal and symmetric. Motor: The motor testing reveals 5 over 5 strength of all 4 extremities. Good symmetric motor tone is noted throughout.  Sensory: Sensory testing is intact to soft touch on all 4 extremities. No evidence of extinction is noted.  Coordination: Cerebellar testing reveals good finger-nose-finger and heel-to-shin bilaterally.  Gait and station: Gait is normal. Tandem gait is normal.  Reflexes: Deep tendon reflexes are symmetric and normal bilaterally.   DIAGNOSTIC DATA (LABS, IMAGING, TESTING) - I reviewed patient records, labs, notes, testing and imaging myself where available.  Lab Results  Component Value Date   WBC 10.1 01/09/2018   HGB 14.5 01/09/2018   HCT 44.9 01/09/2018   MCV 92.4 01/09/2018   PLT 202 01/09/2018      Component Value Date/Time   NA 139 01/09/2018 1411   K 3.9 01/09/2018 1411   CL 106 01/09/2018 1411   CO2 21 (L) 01/09/2018 1411   GLUCOSE 93 01/09/2018 1411   BUN 9 01/09/2018 1411   CREATININE 0.76 01/09/2018 1411   CALCIUM 9.0 01/09/2018 1411   PROT 8.2 (H) 01/09/2018 1411   ALBUMIN 4.3 01/09/2018 1411   AST 24 01/09/2018 1411   ALT 23 01/09/2018 1411   ALKPHOS 63 01/09/2018 1411   BILITOT 1.0 01/09/2018 1411   GFRNONAA >60 01/09/2018 1411   GFRAA >60 01/09/2018 1411   No results found for: CHOL, HDL, LDLCALC, LDLDIRECT, TRIG, CHOLHDL No results found for: HGBA1C No results found for: VITAMINB12 No results found for: TSH  ASSESSMENT AND PLAN 43 y.o. year old female  has a past medical history of Allergic rhinitis, Anxiety, Chiari malformation,  DDD (degenerative disc disease), cervical, DDD (degenerative disc disease), cervical, Degenerative disc disease, lumbar, Depression, Fibromyalgia, Hyperlipemia, Migraine, Scoliosis, and WPW (Wolff-Parkinson-White syndrome).  here with:  1.  Chronic daily migraine -Continues to report daily migraine headache, but improvement, used to be 3 migraines daily -MRI of the brain in October 2019 showed single isolated right deep frontal gliosis, most consistent with chronic migraine headache small vessel disease -Continue Aimovig 140 mg monthly injection -Continue Ubrelvy, Fiorcet, may mix with tizanidine, Benadryl, heating pad, Zofran as migraine cocktail, she remains on oxycodone from pain management -ENT referral for chronic left sinus pressure, report of maxillary mass needing evaluation 5 years ago, she has significant allergies -If ENT evaluation is not beneficial, we may consider a referral to headache clinic in the future, she has tried Botox without benefit -Follow-up in 6 months or sooner if needed  I spent 15 minutes with the patient. 50% of this time was spent discussing her plan of care.  Margie Ege, AGNP-C, DNP 12/11/2018, 1:39 PM [facility] [address] [place], [place] [address] [phone]

## 2018-12-11 ENCOUNTER — Encounter: Payer: Self-pay | Admitting: Neurology

## 2018-12-11 ENCOUNTER — Other Ambulatory Visit: Payer: Self-pay

## 2018-12-11 ENCOUNTER — Ambulatory Visit: Payer: Medicaid Other | Admitting: Neurology

## 2018-12-11 VITALS — BP 130/88 | HR 82 | Temp 98.0°F | Ht 64.0 in

## 2018-12-11 DIAGNOSIS — I639 Cerebral infarction, unspecified: Secondary | ICD-10-CM | POA: Diagnosis not present

## 2018-12-11 DIAGNOSIS — G43611 Persistent migraine aura with cerebral infarction, intractable, with status migrainosus: Secondary | ICD-10-CM

## 2018-12-11 NOTE — Patient Instructions (Signed)
1. Continue Aimovig 2. Continue Roselyn Meier, Fiorcet as needed 3. I will place referral for ENT 4. Return in 6 months

## 2018-12-12 NOTE — Progress Notes (Signed)
I have reviewed and agreed above plan. 

## 2018-12-14 ENCOUNTER — Other Ambulatory Visit: Payer: Self-pay

## 2018-12-14 DIAGNOSIS — Z20822 Contact with and (suspected) exposure to covid-19: Secondary | ICD-10-CM

## 2018-12-15 LAB — NOVEL CORONAVIRUS, NAA: SARS-CoV-2, NAA: NOT DETECTED

## 2018-12-30 ENCOUNTER — Encounter (HOSPITAL_COMMUNITY): Payer: Self-pay

## 2018-12-30 ENCOUNTER — Ambulatory Visit (HOSPITAL_COMMUNITY)
Admission: EM | Admit: 2018-12-30 | Discharge: 2018-12-30 | Disposition: A | Discharge status: Home / Self Care | Payer: Medicaid Other | Attending: Family Medicine | Admitting: Family Medicine

## 2018-12-30 ENCOUNTER — Other Ambulatory Visit: Payer: Self-pay | Admitting: Neurology

## 2018-12-30 ENCOUNTER — Other Ambulatory Visit: Payer: Self-pay

## 2018-12-30 DIAGNOSIS — B3731 Acute candidiasis of vulva and vagina: Secondary | ICD-10-CM

## 2018-12-30 DIAGNOSIS — B373 Candidiasis of vulva and vagina: Secondary | ICD-10-CM

## 2018-12-30 MED ORDER — FLUCONAZOLE 150 MG PO TABS: 150.0000 mg | ORAL_TABLET | Freq: Every day | ORAL | 0 refills | Status: DC

## 2018-12-30 NOTE — Discharge Instructions (Signed)
Use diflucan for the infection Repeat if needed

## 2018-12-30 NOTE — ED Provider Notes (Signed)
[facility]    CSN: [id number] Arrival date & time: 12/30/18  1619      History   Chief Complaint Chief Complaint  Patient presents with  . Vaginal Itching    HPI Brooke [name] is a 43 y.o. female.   HPI  Patient just finished a week of Augmentin for sinus infection.  She now has vaginal itching and thick discharge.  She is certain it is a yeast infection.  She desires Diflucan  Past Medical History:  Diagnosis Date  . Allergic rhinitis   . Anxiety   . Chiari malformation   . DDD (degenerative disc disease), cervical   . DDD (degenerative disc disease), cervical   . Degenerative disc disease, lumbar   . Depression   . Fibromyalgia   . Hyperlipemia   . Migraine   . Scoliosis   . WPW (Wolff-Parkinson-White syndrome)     Patient Active Problem List   Diagnosis Date Noted  . Somnolence, daytime 11/22/2017  . Snoring 11/22/2017  . Chronic migraine 05/30/2016  . Polypharmacy 05/30/2016  . Atrophy of quadriceps femoris muscle 05/26/2016  . Patellar subluxation, left, sequela 05/26/2016  . Status migrainosus 08/21/2015  . Intractable pain 04/26/2015  . Hydronephrosis determined by ultrasound 04/26/2015  . Right flank pain   . Ureteral calculus, right 04/25/2015    Class: Acute  . Kidney stone 04/20/2015  . Obstruction of right ureteropelvic junction due to stone 04/20/2015  . UTI (lower urinary tract infection) 04/20/2015  . Fibromyalgia 04/20/2015  . Chronic pain disorder 04/20/2015  . Depression   . Anxiety   . Intractable chronic migraine without aura and without status migrainosus 03/26/2014  . Migraine 12/24/2013  . Persistent headaches 09/12/2012  . 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 Tobacco abuse 09/12/2012    Past Surgical History:  Procedure Laterality Date  . CESAREAN SECTION    . CYSTOSCOPY W/ URETERAL STENT PLACEMENT Right 04/25/2015   Procedure: CYSTOSCOPY WITH RETROGRADE PYELOGRAM/URETERAL STENT PLACEMENT;  Surgeon: Ihor GullyMark Ottelin, MD;   Location: [facility];  Service: Urology;  Laterality: Right;  . ears Bilateral   . ESOPHAGOGASTRODUODENOSCOPY (EGD) WITH PROPOFOL N/A 12/05/2017   Procedure: ESOPHAGOGASTRODUODENOSCOPY (EGD) WITH PROPOFOL;  Surgeon: Jeani HawkingHung, Patrick, MD;  Location: [facility];  Service: Endoscopy;  Laterality: N/A;  . EYE SURGERY Bilateral   . herneated bowel repair    . HERNIA REPAIR    . HOLMIUM LASER APPLICATION Right 04/25/2015   Procedure: HOLMIUM LASER APPLICATION;  Surgeon: Ihor GullyMark Ottelin, MD;  Location: [facility];  Service: Urology;  Laterality: Right;  . KNEE SURGERY Bilateral    Right Knee x 1 - Left x3  . SAVORY DILATION N/A 12/05/2017   Procedure: SAVORY DILATION;  Surgeon: Jeani HawkingHung, Patrick, MD;  Location: [facility];  Service: Endoscopy;  Laterality: N/A;  . TUBAL LIGATION    . vein removed Left    arm    OB History   No obstetric history on file.      Home Medications    Prior to Admission medications   Medication Sig Start Date End Date Taking? Authorizing Provider  baclofen (LIORESAL) 10 MG tablet Take 1 tablet (10 mg total) by mouth 2 (two) times daily. Patient taking differently: Take 10 mg by mouth 3 (three) times daily.  02/16/17   Janne NapoleonNeese, Hope M, NP  butalbital-acetaminophen-caffeine (FIORICET) 50-325-40 MG tablet TAKE 1 TABLET BY MOUTH EVERY 8 HOURS AS NEEDED FOR MIGRAINE( NO MORE THAN 12 TABLETS PER MONTH); NO EARLY REFILLS 11/05/18   Levert FeinsteinYan, Yijun,  MD  diphenhydrAMINE (BENADRYL) 25 mg capsule Take 25 mg by mouth daily as needed for allergies.    [provider]  Erenumab-aooe (AIMOVIG) 140 MG/ML SOAJ Inject 140 mg into the skin every 30 (thirty) days. 05/16/18   Levert Feinstein, MD  fluconazole (DIFLUCAN) 150 MG tablet Take 1 tablet (150 mg total) by mouth daily. Repeat in 1 week if needed 12/30/18   Eustace Moore, MD  hydrOXYzine (VISTARIL) 100 MG capsule Take 100 mg by mouth 3 (three) times daily as needed for itching.    [provider]  levonorgestrel (MIRENA) 20 MCG/24HR  IUD 1 each by Intrauterine route once.    [provider]  loratadine (CLARITIN) 10 MG tablet Take 10 mg by mouth daily.    [provider]  MELOXICAM PO Take by mouth daily.    [provider]  Multiple Vitamins-Minerals (MULTIVITAMIN WITH MINERALS) tablet Take 1 tablet by mouth daily.    [provider]  ondansetron (ZOFRAN ODT) 4 MG disintegrating tablet 4mg  ODT q4 hours prn nausea/vomit 08/29/18   10/30/18, MD  Oxycodone HCl 10 MG TABS Take 10 mg by mouth 5 (five) times daily. Take one tablet five times daily.     [provider]  pregabalin (LYRICA) 75 MG capsule Take by mouth 3 (three) times daily.    [provider]  tiZANidine (ZANAFLEX) 4 MG tablet Take 1 tablet (4 mg total) by mouth every 6 (six) hours as needed for muscle spasms. 05/16/18   05/18/18, MD  Ubrogepant (UBRELVY) 50 MG TABS Take 50 mg by mouth as needed. 07/30/18   09/29/18, MD  UNABLE TO FIND Med Name: Allergy injections. (Does herself.)    [provider]  zolmitriptan (ZOMIG) 5 MG tablet Take 1 tablet (5 mg total) by mouth as needed for migraine. As need, 2 hours apart, maximum 2 tabs in 24 hours. 05/16/18   05/18/18, MD    Family History Family History  Problem Relation Age of Onset  . Migraines Mother   . CAD Father   . Migraines Daughter     Social History Social History   Tobacco Use  . Smoking status: Current Every Day Smoker    Packs/day: 0.25    Types: Cigarettes  . Smokeless tobacco: Never Used  Substance Use Topics  . Alcohol use: No    Alcohol/week: 0.0 standard drinks  . Drug use: No     Allergies   Digoxin, Digoxin and related, Toradol [ketorolac tromethamine], Latex, Other, Ajovy [fremanezumab-vfrm], and Tape   Review of Systems Review of Systems  Constitutional: Negative for chills and fever.  HENT: Negative for ear pain and sore throat.   Eyes: Negative for pain and visual disturbance.  Respiratory: Negative for cough  and shortness of breath.   Cardiovascular: Negative for chest pain and palpitations.  Gastrointestinal: Negative for abdominal pain and vomiting.  Genitourinary: Positive for vaginal discharge. Negative for dysuria and hematuria.  Musculoskeletal: Negative for arthralgias and back pain.  Skin: Negative for color change and rash.  Neurological: Negative for seizures and syncope.  All other systems reviewed and are negative.    Physical Exam Triage Vital Signs ED Triage Vitals  Enc Vitals Group     BP 12/30/18 1645 118/77     Pulse Rate 12/30/18 1645 77     Resp 12/30/18 1645 15     Temp 12/30/18 1645 99 F (37.2 C)     Temp Source 12/30/18 1645 Oral  SpO2 12/30/18 1645 99 %     Weight --      Height --      Head Circumference --      Peak Flow --      Pain Score 12/30/18 1642 0     Pain Loc --      Pain Edu? --      Excl. in [place]? --    No data found.  Updated Vital Signs BP 118/77 (BP Location: Right Arm)   Pulse 77   Temp 99 F (37.2 C) (Oral)   Resp 15   SpO2 99%   Visual Acuity Right Eye Distance:   Left Eye Distance:   Bilateral Distance:    Right Eye Near:   Left Eye Near:    Bilateral Near:     Physical Exam Constitutional:      General: She is not in acute distress.    Appearance: She is well-developed.  HENT:     Head: Normocephalic and atraumatic.  Eyes:     Conjunctiva/sclera: Conjunctivae normal.     Pupils: Pupils are equal, round, and reactive to light.  Neck:     Musculoskeletal: Normal range of motion.  Cardiovascular:     Rate and Rhythm: Normal rate.  Pulmonary:     Effort: Pulmonary effort is normal. No respiratory distress.  Abdominal:     General: There is no distension.     Palpations: Abdomen is soft.  Genitourinary:    Comments: Exam deferred Musculoskeletal: Normal range of motion.  Skin:    General: Skin is warm and dry.  Neurological:     Mental Status: She is alert.  Psychiatric:        Mood and Affect: Mood  normal.        Behavior: Behavior normal.      UC Treatments / Results  Labs (all labs ordered are listed, but only abnormal results are displayed) Labs Reviewed - No data to display  EKG   Radiology No results found.  Procedures Procedures (including critical care time)  Medications Ordered in UC Medications - No data to display  Initial Impression / Assessment and Plan / UC Course  I have reviewed the triage vital signs and the nursing notes.  Pertinent labs & imaging results that were available during my care of the patient were reviewed by me and considered in my medical decision making (see chart for details).      Final Clinical Impressions(s) / UC Diagnoses   Final diagnoses:  Yeast vaginitis     Discharge Instructions     Use diflucan for the infection Repeat if needed   ED Prescriptions    Medication Sig Dispense Auth. Provider   fluconazole (DIFLUCAN) 150 MG tablet Take 1 tablet (150 mg total) by mouth daily. Repeat in 1 week if needed 2 tablet Raylene Everts, MD     PDMP not reviewed this encounter.   Raylene Everts, MD 12/30/18 [phone]

## 2018-12-30 NOTE — ED Triage Notes (Signed)
Pt present with vaginal itchy x 2 days. Pt denies vaginal discharge. 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

## 2019-01-23 ENCOUNTER — Telehealth: Payer: Self-pay | Admitting: Neurology

## 2019-01-23 NOTE — Telephone Encounter (Signed)
Pt called wanting to know if there is anyway she can be switched from [place] to Nurtec. Please advise.

## 2019-01-24 MED ORDER — NURTEC 75 MG PO TBDP: 75.0000 mg | ORAL_TABLET | ORAL | 5 refills | Status: DC | PRN

## 2019-01-24 NOTE — Telephone Encounter (Signed)
Spoke with the patient and she was very appreciative for the phone call. ENT referral shows that they will need to call and schedule an appt with her. No other questions or concerns at this time.

## 2019-01-24 NOTE — Telephone Encounter (Signed)
Yes okay to switch to Nurtec. D/c ubrelvy. Take 75 mg ODT as needed for migraine. Max: 1 tablet in 24 hours. Rx sent, check about saving card for patient and let her know. She sent message about referral for headache clinic, the last note says referral to ENT, if not helpful would send to headache clinic. Check on this, thanks.

## 2019-01-28 ENCOUNTER — Other Ambulatory Visit: Payer: Self-pay | Admitting: *Deleted

## 2019-01-28 MED ORDER — TIZANIDINE HCL 4 MG PO TABS: 4.0000 mg | ORAL_TABLET | Freq: Four times a day (QID) | ORAL | 6 refills | Status: DC | PRN

## 2019-02-25 ENCOUNTER — Other Ambulatory Visit: Payer: Self-pay | Admitting: *Deleted

## 2019-02-25 MED ORDER — BUTALBITAL-APAP-CAFFEINE 50-325-40 MG PO TABS: ORAL_TABLET | ORAL | 2 refills | Status: DC

## 2019-02-27 ENCOUNTER — Telehealth: Payer: Self-pay | Admitting: Neurology

## 2019-02-27 ENCOUNTER — Other Ambulatory Visit: Payer: Self-pay | Admitting: *Deleted

## 2019-02-27 DIAGNOSIS — G43611 Persistent migraine aura with cerebral infarction, intractable, with status migrainosus: Secondary | ICD-10-CM

## 2019-02-27 MED ORDER — BUTALBITAL-APAP-CAFFEINE 50-325-40 MG PO TABS: ORAL_TABLET | ORAL | 0 refills | Status: DC

## 2019-02-27 NOTE — Telephone Encounter (Addendum)
I called and spoke to pt and relayed that I did phone in new prescription for her relating to fiorcet and that she could pick up tomorrow.  I relayed that Dr. Terrace Arabia has referred her to headache clinic (will try to expediate this for her).  She has rebound headaches and Dr. Terrace Arabia has tried all she know to do at this time.  This is her last fill of iforcet.  I spoke to [employer] at [employer] and gave verbal order.  Pt verbalized understanding.

## 2019-02-27 NOTE — Telephone Encounter (Signed)
Drug registry checked fiorcet not on this. I called and spoke to pharmacist.  Pt last received 01/28/19 and then prior to that 01/03/19 for #12 tabs.  Are you ok to rewrite the prescription (without the line cannot refill until the 03/03/18).

## 2019-02-27 NOTE — Telephone Encounter (Signed)
I called pt and and relayed that this was the last fiorcet rx that she will be given.  Will try to expediate the referral to headache clinic.  Ok to get prescription of fiorcet tomorrow. calld pharmacy.

## 2019-02-27 NOTE — Telephone Encounter (Signed)
I have E prescribed Fioricet 12 tablets on February 25, 2019, this will be her last Fioricet prescription from our office,  I have referred her to headache clinic

## 2019-02-27 NOTE — Telephone Encounter (Signed)
Pt called wanting to know why she has to wait till the 11th to fill her butalbital-acetaminophen-caffeine (FIORICET) 50-325-40 MG tablet when last month she filled it on the 5th. Please advise.

## 2019-02-27 NOTE — Telephone Encounter (Signed)
I called pt and she states that she filled 01-28-19 for #12 tabs and is having daily headaches.  She saw ENT, Dr. Jenne Pane and negative for sinus issues.  She would like to get referral to headache clinic, also asking for refill of the fiorcet. I explained that MK/RN had refilled/ counted and was noted that -03-03-18 would be next fill date.  Please advise.

## 2019-02-27 NOTE — Addendum Note (Signed)
Addended by: Guy Begin on: 02/27/2019 04:50 PM   Modules accepted: Orders

## 2019-02-28 ENCOUNTER — Telehealth: Payer: Self-pay | Admitting: Neurology

## 2019-02-28 NOTE — Telephone Encounter (Signed)
Gunnar Fusi from [facility] called stating that they are not scheduling/accepting Medicaid pt's at this time.

## 2019-03-05 NOTE — Telephone Encounter (Signed)
Sent to [facility] [employer] telephone  [phone] - fax [phone] . Patient is aware.

## 2019-04-08 ENCOUNTER — Telehealth: Payer: Self-pay | Admitting: Neurology

## 2019-04-08 DIAGNOSIS — G8929 Other chronic pain: Secondary | ICD-10-CM

## 2019-04-08 DIAGNOSIS — G43611 Persistent migraine aura with cerebral infarction, intractable, with status migrainosus: Secondary | ICD-10-CM

## 2019-04-08 NOTE — Telephone Encounter (Signed)
Patient called stating she would like to know if she can get the nurtec doubled or if she can be put back on ubrevy. Patient also states she has not heard back from the migraine clinic.

## 2019-04-08 NOTE — Telephone Encounter (Signed)
She is at the full dose of Nurtec. Either Nurtec or Ubrelvy, not both. Can we refer her to another headache clinic who does accept medicaid. Both Nurtec and Ubrelvy are CGRP medications.

## 2019-04-08 NOTE — Telephone Encounter (Signed)
[facility],  They do not take primary medicaid.  They called and LMVM for pt.  Who else would you recommend?   Also address other nurtec or ubrelvy.

## 2019-04-09 MED ORDER — UBRELVY 50 MG PO TABS: 50.0000 mg | ORAL_TABLET | ORAL | 3 refills | Status: DC | PRN

## 2019-04-09 NOTE — Telephone Encounter (Signed)
I called pt and she states did not receive call or message that [facility] not take primary med[facility].  She still has medicaid.  I stated that [facility] or [facility] is other option.  She choose [facility].  I will send referral. She asked for any other options for her migraines.  Nurtec not lasting her #8 tabs are being used up in the month.  I relayed that Nurtec, Bernita Raisin are both CGRP meds and can only take one. She states works but does not have enough to last her for the month.  She is taking mobic, oxzycodone, lyrica, tizanidine prn(cocktail) aimovig, No zomig.  I relayed that she was being sent to other headache specialist as we had exhausted what we had to offer.  I would forward to SS/NP and let her know.

## 2019-04-09 NOTE — Telephone Encounter (Signed)
Agree refer to headache clinic.

## 2019-04-09 NOTE — Addendum Note (Signed)
Addended by: Guy Begin on: 04/09/2019 02:08 PM   Modules accepted: Orders

## 2019-04-09 NOTE — Addendum Note (Signed)
Addended by: Glean Salvo on: 04/09/2019 02:24 PM   Modules accepted: Orders

## 2019-04-09 NOTE — Addendum Note (Signed)
Addended by: Guy Begin on: 04/09/2019 10:18 AM   Modules accepted: Orders

## 2019-04-12 ENCOUNTER — Ambulatory Visit (HOSPITAL_COMMUNITY)
Admission: EM | Admit: 2019-04-12 | Discharge: 2019-04-12 | Disposition: A | Discharge status: Home / Self Care | Payer: Medicaid Other | Attending: Family Medicine | Admitting: Family Medicine

## 2019-04-12 ENCOUNTER — Encounter (HOSPITAL_COMMUNITY): Payer: Self-pay

## 2019-04-12 ENCOUNTER — Other Ambulatory Visit: Payer: Self-pay

## 2019-04-12 DIAGNOSIS — L509 Urticaria, unspecified: Secondary | ICD-10-CM | POA: Diagnosis not present

## 2019-04-12 DIAGNOSIS — R21 Rash and other nonspecific skin eruption: Secondary | ICD-10-CM

## 2019-04-12 MED ORDER — CLOBETASOL PROPIONATE 0.05 % EX GEL: CUTANEOUS | 0 refills | Status: AC

## 2019-04-12 MED ORDER — CARBINOXAMINE MALEATE 4 MG PO TABS: 4.0000 mg | ORAL_TABLET | Freq: Three times a day (TID) | ORAL | 0 refills | Status: DC

## 2019-04-12 MED ORDER — DEXAMETHASONE SODIUM PHOSPHATE 10 MG/ML IJ SOLN
INTRAMUSCULAR | Status: AC
  Filled 2019-04-12: qty 1

## 2019-04-12 MED ORDER — DEXAMETHASONE SODIUM PHOSPHATE 10 MG/ML IJ SOLN
10.0000 mg | Freq: Once | INTRAMUSCULAR | Status: AC
  Administered 2019-04-12: 10 mg via INTRAMUSCULAR

## 2019-04-12 MED ORDER — PREDNISONE 10 MG (21) PO TBPK: ORAL_TABLET | Freq: Every day | ORAL | 0 refills | Status: AC

## 2019-04-12 NOTE — ED Provider Notes (Signed)
[facility]    CSN: [id number] Arrival date & time: 04/12/19  4818      History   Chief Complaint Chief Complaint  Patient presents with  . Rash    HPI Brooke [name] is a 44 y.o. female.   Patient reports that she is not doing home injections of immunotherapy.  She reports that she is on her final vial, but cannot recall exactly what she is injecting.  Reports that she gets her immunotherapy injections from her pain clinic.  Reports rash, itching, redness, that is generalized to cover her body.  Reports that she is taking her hydroxyzine and Benadryl with no relief.  Reports that she has not slept in about 2 days from being so itchy.  Denies fever, chills, headache, sore throat, wheezing, chest tightness, shortness of breath.  ROS as per HPI  The history is provided by the patient.    Past Medical History:  Diagnosis Date  . Allergic rhinitis   . Anxiety   . Chiari malformation   . DDD (degenerative disc disease), cervical   . DDD (degenerative disc disease), cervical   . Degenerative disc disease, lumbar   . Depression   . Fibromyalgia   . Hyperlipemia   . Migraine   . Scoliosis   . WPW (Wolff-Parkinson-White syndrome)     Patient Active Problem List   Diagnosis Date Noted  . Somnolence, daytime 11/22/2017  . Snoring 11/22/2017  . Chronic migraine 05/30/2016  . Polypharmacy 05/30/2016  . Atrophy of quadriceps femoris muscle 05/26/2016  . Patellar subluxation, left, sequela 05/26/2016  . Status migrainosus 08/21/2015  . Intractable pain 04/26/2015  . Hydronephrosis determined by ultrasound 04/26/2015  . Ureteral calculus, right 04/25/2015    Class: Acute  . Kidney stone 04/20/2015  . Fibromyalgia 04/20/2015  . Chronic pain disorder 04/20/2015  . Depression   . Anxiety   . Intractable chronic migraine without aura and without status migrainosus 03/26/2014  . Persistent headaches 09/12/2012  . 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 Tobacco abuse 09/12/2012     Past Surgical History:  Procedure Laterality Date  . CESAREAN SECTION    . CYSTOSCOPY W/ URETERAL STENT PLACEMENT Right 04/25/2015   Procedure: CYSTOSCOPY WITH RETROGRADE PYELOGRAM/URETERAL STENT PLACEMENT;  Surgeon: Ihor Gully, MD;  Location: [facility];  Service: Urology;  Laterality: Right;  . ears Bilateral   . ESOPHAGOGASTRODUODENOSCOPY (EGD) WITH PROPOFOL N/A 12/05/2017   Procedure: ESOPHAGOGASTRODUODENOSCOPY (EGD) WITH PROPOFOL;  Surgeon: Jeani Hawking, MD;  Location: [facility];  Service: Endoscopy;  Laterality: N/A;  . EYE SURGERY Bilateral   . herneated bowel repair    . HERNIA REPAIR    . HOLMIUM LASER APPLICATION Right 04/25/2015   Procedure: HOLMIUM LASER APPLICATION;  Surgeon: Ihor Gully, MD;  Location: [facility];  Service: Urology;  Laterality: Right;  . KNEE SURGERY Bilateral    Right Knee x 1 - Left x3  . SAVORY DILATION N/A 12/05/2017   Procedure: SAVORY DILATION;  Surgeon: Jeani Hawking, MD;  Location: [facility];  Service: Endoscopy;  Laterality: N/A;  . TUBAL LIGATION    . vein removed Left    arm    OB History   No obstetric history on file.      Home Medications    Prior to Admission medications   Medication Sig Start Date End Date Taking? Authorizing Provider  hydrOXYzine (VISTARIL) 100 MG capsule Take 100 mg by mouth 3 (three) times daily as needed for itching.   Yes [provider]  butalbital-acetaminophen-caffeine (FIORICET) 50-325-40 MG tablet TAKE 1 TABLET BY MOUTH EVERY 8 HOURS AS NEEDED FOR MIGRAINE, NOT TO EXCEED 12 TABLETS PER MONTH 02/27/19   Marcial Pacas, MD  Carbinoxamine Maleate 4 MG TABS Take 1 tablet (4 mg total) by mouth every 8 (eight) hours for 10 days. 04/12/19 04/22/19  Faustino Congress, NP  clobetasol (TEMOVATE) 0.05 % GEL Apply to affected area twice daily as needed for rash itching and burning 04/12/19   Faustino Congress, NP  diphenhydrAMINE (BENADRYL) 25 mg capsule Take 25 mg by mouth daily as needed for allergies.     [provider]  Erenumab-aooe (AIMOVIG) 140 MG/ML SOAJ Inject 140 mg into the skin every 30 (thirty) days. 05/16/18   Marcial Pacas, MD  fluconazole (DIFLUCAN) 150 MG tablet Take 1 tablet (150 mg total) by mouth daily. Repeat in 1 week if needed 12/30/18   Raylene Everts, MD  levonorgestrel [facility]) 20 MCG/24HR IUD 1 each by Intrauterine route once.    [provider]  loratadine (CLARITIN) 10 MG tablet Take 10 mg by mouth daily.    [provider]  MELOXICAM PO Take by mouth daily.    [provider]  Multiple Vitamins-Minerals (MULTIVITAMIN WITH MINERALS) tablet Take 1 tablet by mouth daily.    [provider]  ondansetron (ZOFRAN ODT) 4 MG disintegrating tablet 4mg  ODT q4 hours prn nausea/vomit 08/29/18   Marcial Pacas, MD  Oxycodone HCl 10 MG TABS Take 10 mg by mouth 5 (five) times daily. Take one tablet five times daily.     [provider]  predniSONE (STERAPRED UNI-PAK 21 TAB) 10 MG (21) TBPK tablet Take by mouth daily for 6 days. Take 6 tablets on day 1, 5 tablets on day 2, 4 tablets on day 3, 3 tablets on day 4, 2 tablets on day 5, 1 tablet on day 6 04/12/19 04/18/19  Faustino Congress, NP  pregabalin (LYRICA) 75 MG capsule Take by mouth 3 (three) times daily.    [provider]  Rimegepant Sulfate (NURTEC) 75 MG TBDP Take 75 mg by mouth as needed (Take 1 tablet as needed for migraine, max 1 tablet in 24 hours). 01/24/19   Suzzanne Cloud, NP  tiZANidine (ZANAFLEX) 4 MG tablet Take 1 tablet (4 mg total) by mouth every 6 (six) hours as needed for muscle spasms. 01/28/19   Marcial Pacas, MD  Ubrogepant (UBRELVY) 50 MG TABS Take 50 mg by mouth as needed (may repeat once 2 hrs after initial dose). 04/09/19   Suzzanne Cloud, NP  UNABLE TO FIND Med Name: Allergy injections. (Does herself.)    [provider]  zolmitriptan (ZOMIG) 5 MG tablet Take 1 tablet (5 mg total) by mouth as needed for migraine. As need, 2 hours apart, maximum 2 tabs  in 24 hours. 05/16/18   Marcial Pacas, MD    Family History Family History  Problem Relation Age of Onset  . Migraines Mother   . CAD Father   . Migraines Daughter     Social History Social History   Tobacco Use  . Smoking status: Current Every Day Smoker    Packs/day: 0.30    Types: Cigarettes  . Smokeless tobacco: Never Used  Substance Use Topics  . Alcohol use: No    Alcohol/week: 0.0 standard drinks  . Drug use: No     Allergies   Digoxin, Digoxin and related, Toradol [ketorolac tromethamine], Latex, Other, Ajovy [fremanezumab-vfrm], and Tape   Review of Systems Review of Systems  Physical Exam Triage Vital Signs ED Triage Vitals  Enc Vitals Group     BP      Pulse      Resp      Temp      Temp src      SpO2      Weight      Height      Head Circumference      Peak Flow      Pain Score      Pain Loc      Pain Edu?      Excl. in GC?    No data found.  Updated Vital Signs BP 135/88 (BP Location: Left Arm)   Pulse 67   Temp 98.5 F (36.9 C) (Oral)   Resp 15   SpO2 99%   Visual Acuity Right Eye Distance:   Left Eye Distance:   Bilateral Distance:    Right Eye Near:   Left Eye Near:    Bilateral Near:     Physical Exam Vitals and nursing note reviewed.  Constitutional:      General: She is not in acute distress.    Appearance: She is well-developed.  HENT:     Head: Normocephalic and atraumatic.     Right Ear: Tympanic membrane normal.     Left Ear: Tympanic membrane normal.     Nose: Nose normal.     Mouth/Throat:     Mouth: Mucous membranes are moist.  Eyes:     Conjunctiva/sclera: Conjunctivae normal.  Cardiovascular:     Rate and Rhythm: Normal rate and regular rhythm.     Heart sounds: No murmur.  Pulmonary:     Effort: Pulmonary effort is normal. No respiratory distress.     Breath sounds: Normal breath sounds. No stridor. No wheezing, rhonchi or rales.  Chest:     Chest wall: No tenderness.  Abdominal:     General:  Bowel sounds are normal. There is no distension.     Palpations: Abdomen is soft.     Tenderness: There is no abdominal tenderness.  Musculoskeletal:        General: Normal range of motion.     Cervical back: Normal range of motion and neck supple.  Skin:    General: Skin is warm and dry.     Capillary Refill: Capillary refill takes less than 2 seconds.     Findings: Rash present.     Comments: Generalized erythema, patient scratching at herself throughout the exam.  Neurological:     General: No focal deficit present.     Mental Status: She is alert and oriented to person, place, and time.  Psychiatric:        Mood and Affect: Mood normal.        Behavior: Behavior normal.      UC Treatments / Results  Labs (all labs ordered are listed, but only abnormal results are displayed) Labs Reviewed - No data to display  EKG   Radiology No results found.  Procedures Procedures (including critical care time)  Medications Ordered in UC Medications  dexamethasone (DECADRON) injection 10 mg (10 mg Intramuscular Given 04/12/19 0930)    Initial Impression / Assessment and Plan / UC Course  I have reviewed the triage vital signs and the nursing notes.  Pertinent labs & imaging results that were available during my care of the patient were reviewed by me and considered in my medical decision making (see chart for details).  Patient has intense pruritus.  Erythematous generalized rash.  Decadron given in office today.  Carbinoxamine, Pred pack, clobetasol prescribed for this patient.  Instructed to stop immunotherapy until she can follow-up with an allergist.  Phone number and information given on how to do this.  Instructed on when to use EpiPen.  Instructed on when to go to the ER. Final Clinical Impressions(s) / UC Diagnoses   Final diagnoses:  Rash  Hives  Urticaria     Discharge Instructions     STOP your immunotherapy injections. Take prednisone and use clobetasol as  prescribed. May use eucerin cream as well.   Follow up with allergist.  Go to ER for increased itching, shortness of breath, chest tightness, or use epipen.    ED Prescriptions    Medication Sig Dispense Auth. Provider   predniSONE (STERAPRED UNI-PAK 21 TAB) 10 MG (21) TBPK tablet Take by mouth daily for 6 days. Take 6 tablets on day 1, 5 tablets on day 2, 4 tablets on day 3, 3 tablets on day 4, 2 tablets on day 5, 1 tablet on day 6 21 tablet Moshe Cipro, NP   clobetasol (TEMOVATE) 0.05 % GEL Apply to affected area twice daily as needed for rash itching and burning 60 g Moshe Cipro, NP   Carbinoxamine Maleate 4 MG TABS Take 1 tablet (4 mg total) by mouth every 8 (eight) hours for 10 days. 30 tablet Moshe Cipro, NP     I have reviewed the PDMP during this encounter.   Moshe Cipro, NP 04/12/19 1008

## 2019-04-12 NOTE — ED Triage Notes (Signed)
Patient presents to Urgent Care with complaints of multiple itchy rashes since about 4 nights ago. Patient reports she does home injections for immunotherapy and since she started her fourth vial (brought them with  Her), the itching has been intense, slight redness and irritation noted to legs, pt states it is on her head and back as well.

## 2019-04-12 NOTE — Discharge Instructions (Addendum)
STOP your immunotherapy injections. Take prednisone and use clobetasol as prescribed. May use eucerin cream as well.   Follow up with allergist.  Go to ER for increased itching, shortness of breath, chest tightness, or use epipen.

## 2019-05-01 ENCOUNTER — Ambulatory Visit: Payer: Medicaid Other | Admitting: Obstetrics and Gynecology

## 2019-05-06 ENCOUNTER — Encounter: Payer: Self-pay | Admitting: Obstetrics and Gynecology

## 2019-05-06 ENCOUNTER — Other Ambulatory Visit: Payer: Self-pay

## 2019-05-06 ENCOUNTER — Other Ambulatory Visit (HOSPITAL_COMMUNITY)
Admission: RE | Admit: 2019-05-06 | Discharge: 2019-05-06 | Disposition: A | Discharge status: Home / Self Care | Payer: Medicaid Other | Source: Ambulatory Visit | Attending: Obstetrics and Gynecology | Admitting: Obstetrics and Gynecology

## 2019-05-06 ENCOUNTER — Ambulatory Visit (INDEPENDENT_AMBULATORY_CARE_PROVIDER_SITE_OTHER): Payer: Medicaid Other | Admitting: Obstetrics and Gynecology

## 2019-05-06 VITALS — BP 131/83 | HR 94 | Ht 64.0 in | Wt 182.0 lb

## 2019-05-06 DIAGNOSIS — Z01419 Encounter for gynecological examination (general) (routine) without abnormal findings: Secondary | ICD-10-CM | POA: Diagnosis present

## 2019-05-06 DIAGNOSIS — Z30433 Encounter for removal and reinsertion of intrauterine contraceptive device: Secondary | ICD-10-CM | POA: Diagnosis not present

## 2019-05-06 MED ORDER — LEVONORGESTREL 20 MCG/24HR IU IUD: INTRAUTERINE_SYSTEM | Freq: Once | INTRAUTERINE | Status: AC

## 2019-05-06 NOTE — Progress Notes (Signed)
Subjective:     Brooke [name] is a 44 y.o. female P2 with BMI 31 who is here for a comprehensive physical exam. The patient reports no problems. Patient is sexually active using BTL and Mirena for contraception. Mirena IUD being used for cycle control and migraine headaches associated with menses. Patient reports amenorrhea with IUD. She is without complaints. She denies pelvic pain or abnormal discharge.   Past Medical History:  Diagnosis Date  . Allergic rhinitis   . Anxiety   . Chiari malformation   . DDD (degenerative disc disease), cervical   . DDD (degenerative disc disease), cervical   . Degenerative disc disease, lumbar   . Depression   . Fibromyalgia   . Hyperlipemia   . Migraine   . Scoliosis   . WPW (Wolff-Parkinson-White syndrome)    Past Surgical History:  Procedure Laterality Date  . CESAREAN SECTION    . CYSTOSCOPY W/ URETERAL STENT PLACEMENT Right 04/25/2015   Procedure: CYSTOSCOPY WITH RETROGRADE PYELOGRAM/URETERAL STENT PLACEMENT;  Surgeon: Kathie Rhodes, MD;  Location: [facility];  Service: Urology;  Laterality: Right;  . ears Bilateral   . ESOPHAGOGASTRODUODENOSCOPY (EGD) WITH PROPOFOL N/A 12/05/2017   Procedure: ESOPHAGOGASTRODUODENOSCOPY (EGD) WITH PROPOFOL;  Surgeon: Carol Ada, MD;  Location: [facility];  Service: Endoscopy;  Laterality: N/A;  . EYE SURGERY Bilateral   . herneated bowel repair    . HERNIA REPAIR    . HOLMIUM LASER APPLICATION Right 04/02/4763   Procedure: HOLMIUM LASER APPLICATION;  Surgeon: Kathie Rhodes, MD;  Location: [facility];  Service: Urology;  Laterality: Right;  . KNEE SURGERY Bilateral    Right Knee x 1 - Left x3  . SAVORY DILATION N/A 12/05/2017   Procedure: SAVORY DILATION;  Surgeon: Carol Ada, MD;  Location: [facility];  Service: Endoscopy;  Laterality: N/A;  . TUBAL LIGATION    . vein removed Left    arm   Family History  Problem Relation Age of Onset  . Migraines Mother   . CAD Father   . Migraines Daughter      Social History   Socioeconomic History  . Marital status: Divorced    Spouse name: Not on file  . Number of children: 2  . Years of education: 34  . Highest education level: Not on file  Occupational History    Comment: Not working  Tobacco Use  . Smoking status: Current Every Day Smoker    Packs/day: 0.30    Types: Cigarettes  . Smokeless tobacco: Never Used  Substance and Sexual Activity  . Alcohol use: No    Alcohol/week: 0.0 standard drinks  . Drug use: No  . Sexual activity: Not on file  Other Topics Concern  . Not on file  Social History Narrative   Patient lives at home with her two children and her mother.   Unemployed. - Patient trying to get social security.   Education high school.   Right handed.   Caffeine soda's four soda's daily.   Social Determinants of Health   Financial Resource Strain:   . Difficulty of Paying Living Expenses:   Food Insecurity:   . Worried About Charity fundraiser in the Last Year:   . Arboriculturist in the Last Year:   Transportation Needs:   . Film/video editor (Medical):   Marland Kitchen Lack of Transportation (Non-Medical):   Physical Activity:   . Days of Exercise per Week:   . Minutes of Exercise per Session:   Stress:   .  Feeling of Stress :   Social Connections:   . Frequency of Communication with Friends and Family:   . Frequency of Social Gatherings with Friends and Family:   . Attends Religious Services:   . Active Member of Clubs or Organizations:   . Attends Banker Meetings:   Marland Kitchen Marital Status:   Intimate Partner Violence:   . Fear of Current or Ex-Partner:   . Emotionally Abused:   Marland Kitchen Physically Abused:   . Sexually Abused:    Health Maintenance  Topic Date Due  . HIV Screening  Never done  . TETANUS/TDAP  Never done  . PAP SMEAR-Modifier  Never done  . INFLUENZA VACCINE  Never done       Review of Systems Pertinent items noted in HPI and remainder of comprehensive ROS otherwise  negative.   Objective:  Blood pressure 131/83, pulse 94, height 5\' 4"  (1.626 m), weight 182 lb (82.6 kg).     GENERAL: Well-developed, well-nourished female in no acute distress.  HEENT: Normocephalic, atraumatic. Sclerae anicteric.  NECK: Supple. Normal thyroid.  LUNGS: Clear to auscultation bilaterally.  HEART: Regular rate and rhythm. BREASTS: Symmetric in size. No palpable masses or lymphadenopathy, skin changes, or nipple drainage. ABDOMEN: Soft, nontender, nondistended. No organomegaly. PELVIC: Normal external female genitalia. Vagina is pink and rugated.  Normal discharge. Normal appearing cervix with IUD strings extending 1 cm from os. Uterus is normal in size. No adnexal mass or tenderness. EXTREMITIES: No cyanosis, clubbing, or edema, 2+ distal pulses.    Assessment:    Healthy female exam.      Plan:    pap smear collected Screening mammogram ordered  IUD Removal  Patient identified, informed consent performed, consent signed.  Patient was in the dorsal lithotomy position, normal external genitalia was noted.  A speculum was placed in the patient's vagina, normal discharge was noted, no lesions. The cervix was visualized, no lesions, no abnormal discharge.  The strings of the IUD were grasped and pulled using ring forceps. The IUD was removed in its entirety. In preparation for IUD insertion, the cervix was cleaned with Betadine x 2, grasped anteriorly with a single tooth tenaculum.  Uterus sounded to 8 cm.  Mirena IUD placed per manufacturer's recommendations.  Strings trimmed to 3 cm. Tenaculum was removed, good hemostasis noted.  Patient tolerated procedure well.   Patient given post procedure instructions and Mirena care card with expiration date.  Patient is asked to check IUD strings periodically and follow up in 4-6 weeks for IUD check.   See After Visit Summary for Counseling Recommendations

## 2019-05-10 LAB — CYTOLOGY - PAP
Comment: NEGATIVE
Comment: NEGATIVE
Comment: NEGATIVE
Diagnosis: UNDETERMINED — AB
HPV 16: POSITIVE — AB
HPV 18 / 45: NEGATIVE
High risk HPV: POSITIVE — AB

## 2019-05-14 ENCOUNTER — Telehealth: Payer: Self-pay

## 2019-05-14 NOTE — Telephone Encounter (Signed)
Pt notified of abnormal pap and need for [place]  Pt stated [place] Pt advised to establish care and get records faxed Pt agreeable and voiced understanding Upcoming appt for string check cancelled  Pt confirmed ok to cancel.

## 2019-05-28 ENCOUNTER — Ambulatory Visit: Payer: Medicaid Other

## 2019-06-06 ENCOUNTER — Ambulatory Visit: Payer: Medicaid Other | Admitting: Certified Nurse Midwife

## 2019-06-11 ENCOUNTER — Encounter: Payer: Self-pay | Admitting: Neurology

## 2019-06-11 ENCOUNTER — Other Ambulatory Visit: Payer: Self-pay

## 2019-06-11 ENCOUNTER — Telehealth: Payer: Self-pay | Admitting: Neurology

## 2019-06-11 ENCOUNTER — Ambulatory Visit: Payer: Medicaid Other | Admitting: Neurology

## 2019-06-11 VITALS — BP 118/82 | HR 62 | Temp 97.2°F | Ht 64.0 in | Wt 177.0 lb

## 2019-06-11 DIAGNOSIS — G43709 Chronic migraine without aura, not intractable, without status migrainosus: Secondary | ICD-10-CM | POA: Diagnosis not present

## 2019-06-11 DIAGNOSIS — IMO0002 Reserved for concepts with insufficient information to code with codable children: Secondary | ICD-10-CM

## 2019-06-11 MED ORDER — AIMOVIG 140 MG/ML ~~LOC~~ SOAJ: 140.0000 mg | SUBCUTANEOUS | 11 refills | Status: AC

## 2019-06-11 MED ORDER — NURTEC 75 MG PO TBDP: 75.0000 mg | ORAL_TABLET | ORAL | 5 refills | Status: DC | PRN

## 2019-06-11 MED ORDER — TIZANIDINE HCL 4 MG PO TABS: 4.0000 mg | ORAL_TABLET | Freq: Four times a day (QID) | ORAL | 6 refills | Status: AC | PRN

## 2019-06-11 NOTE — Telephone Encounter (Signed)
Pt called to inform she was advised by pharmacy a qty of 12 cannot be dispensed due to the way medication is packaged but can be dispensed by 8 or 16

## 2019-06-11 NOTE — Progress Notes (Signed)
PATIENT: Brooke [name] DOB: [birth year]  REASON FOR VISIT: follow up HISTORY FROM: patient  HISTORY OF PRESENT ILLNESS: Today 06/11/19  HISTORY  HISTORY OF PRESENT ILLNESS: Brooke Spradlinis a 44 year old right-handed female, referred by her primary care physician Dr. Julio Sicks for evaluation of diffuse body achy pain, migraine  She carries a diagnosis of chronic migraine since 1983,, fibromyalgia since 2011, she complains of chronic neck pain, low back pain, joints pain, radiating pain to her left arm,  She moved from [place] to [place] in November 2013, has been under the [facility] pain management. Over the years, she has tried different medications,   Previously she has tried and failed Topamax, trigger point injection, which actually made her headache worse while she was in [place], she reported frequent emergency room visit, 3 times each month, receiving Dilaudid, She was also treated with Opana, fentanyl patch, Demerol in the past. she is now taking Norco,from her pain management  She reported long-standing history of migraine, her typical migraine are right retro-orbital area severe pounding headache with associated light noise sensitivity, resting helps, lasting few hours,  She has tried Fioricet, Flexeril, Norco,without help, previously she has tried different triptans, including Imitrex, Zomig, Maxalt, without helping,  She has tried and failed preventive medications Topamax, Depakote, Inderal, nortriptyline, Flexeril,  Has tried different abortive treatment, including Imitrex injection, sometimes make her headache worse, Zomig, Maxalt, Imitrex tablets,   Trigger for her headaches are environmental allergy, certain food, such as spicy food, acidic food,  She complains of few years history of bilateral feet parestheisa.increased after bearing weight , reported abnormal EMG nerve conduction study from outside facility in May 2015 consistent with peripheral  neuropathy  We have reviewed MRI of cervical, and lumbar spine at [facility] in June 2015, there was evidence of multilevel degenerative disc disease, there was no significant canal, or foraminal stenosis  She is now currently taking Nucynta ER 150 mg every day for chronic migraine headache, she also daily Excedrin Migraine for her headaches, up to 4 tablets each day,   Migraine Frequency: daily since 2015, increased frequency and severity since Nucynta ER dosage was increased from 100 to 150mg  dose was increased in June 2017. She has severe headache was associated light, noise sensitivity, had to stay in dark quiet room, have her sunglasses even indoor.  Migraine Last from 5 hours to 12 hours.  UPDATE May 30 2016: We have discussed about Botox injection at her last visit in June 2017, but it never followed through, She reported more than 25 days of migraine in one month, this has been ongoing since last 10 years, She has been on chronic narcotics treatment, recently had major change of her pain medications, she is now taking percocet 10/325mg 4 tablets a day,since February 2018, she is still on long-acting narcotics Embeda50/2 mg 1 capsule daily, he is also taking clonazepam 1 mg twice a day, BuSpar 15 mg twice a day, baclofen 10 mg twice a day, Lyrica 75 mg twice a day,  She lying in the dark trying to help her pain, she end up spending most of her time in bed,she lives with her family, she also smokes, is applying for disability.  She complains 8 out of 10 bilateral frontal area throbbing headache with associated light noise sensitivity, and times with associated nausea, dizziness, lasting hours to up to one week.  Update November 28th 2018 About 6 years ago in 2012, she had multiple Botox injections every 3 months at 2013, responded  very well, She is now complains of daily moderate to severe migraine headaches, bilateral frontal, parietal region, at least 6 out of 10, on  polypharmacy treatment, Excedrin Migraine daily for migraine headaches,  Previously she has tried and failed multiple preventive medications, Topamax and Depakote antiepileptic medications Inderal beta blocker Nortriptyline tricyclic antidepressant Flexeril muscle relaxant She has failed Zomig Relpax and Imitrex acutely which are triptans  UPDATE August 03 2017:YY She complains 25/30 days of headaches, has to come to the office multiple times for iv infusion, report 10% improvement with BOTOX injection. ng on laste any wehere 12 hours aday,   aimovig injeciton has helped her, less the severity she is not curled ou pint he ball cryings,  Update December 11, 2018 SS: Here today for follow-up on her migraines.  The increase in Aimovig 140 mg, has been beneficial.  She still complains of daily migraine headache, but headache used to occur 3 times a day.  She has found the Ubrelvy to be helpful for her headaches if she is able to lie down, otherwise, Fioricet has good benefit and will relieve her headache.  She has tried Botox in the past without benefit.  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  She is requesting referral to an ENT specialist.  She does see pain management for chronic pain.  She says she is now has less stress in her life now that she is out of a bad relationship.  She is on disability, she does drive a car, she is now living with family.  Update June 11, 2019 SS: Here today for follow-up accompanied by her fianc.  She remains on Aimovig 140 mg, takes Nurtec, Benadryl, and tizanidine for acute headache, better benefit with Nurtec than Ubrelvy.  She has good benefit with Nurtec, but only gets 8 tablets a month.  She is also on oxycodone, Lyrica for chronic pain.  She is going to be moving to [place], will establish with a new neurologist.   She made an appointment here with a headache clinic, but not until 2022, she did see ENT, did not feel sinuses were related to her migraines.  She indicates she continues to have 18 headache days a month.  She previously did not benefit from Botox.  REVIEW OF SYSTEMS: Out of a complete 14 system review of symptoms, the patient complains only of the following symptoms, and all other reviewed systems are negative.  Headache  ALLERGIES: Allergies  Allergen Reactions  . Digoxin Nausea And Vomiting    Elevated 'white count'  . Digoxin And Related Nausea And Vomiting    Elevated 'white count'  . Toradol [Ketorolac Tromethamine] Other (See Comments)    Uncontrollable 'shakes'  . Latex Hives and Swelling  . Other Other (See Comments)    Any nasal sprays cause serious migranes  . Ajovy [Fremanezumab-Vfrm] Rash  . Tape Itching and Rash    Please use "paper" tape Please use "paper" tape    HOME MEDICATIONS: Outpatient Medications Prior to Visit  Medication Sig Dispense Refill  . clobetasol (TEMOVATE) 0.05 % GEL Apply to affected area twice daily as needed for rash itching and burning 60 g 0  . diphenhydrAMINE (BENADRYL) 25 mg capsule Take 25 mg by mouth daily as needed for allergies.    . hydrOXYzine (VISTARIL) 100 MG capsule Take 100 mg by mouth 3 (three)  times daily as needed for itching.    Marland Kitchen levonorgestrel (MIRENA) 20 MCG/24HR IUD 1 each by Intrauterine route once.    . loratadine (CLARITIN) 10 MG tablet Take 10 mg by mouth daily.    . MELOXICAM PO Take by mouth daily.    . Multiple Vitamins-Minerals (MULTIVITAMIN WITH MINERALS) tablet Take 1 tablet by mouth daily.    . ondansetron (ZOFRAN ODT) 4 MG disintegrating tablet 4mg  ODT q4 hours prn nausea/vomit 10 tablet 5  . Oxycodone HCl 10 MG TABS Take 15 mg by mouth in the morning, at noon, in the evening, and at bedtime. Take one tablet five times daily.     . pregabalin (LYRICA) 75 MG capsule Take by mouth 3 (three) times daily.    (AIMOVIG) 140 MG/ML SOAJ Inject 140 mg into the skin every 30 (thirty) days. 1 pen 11  . Rimegepant Sulfate (NURTEC) 75 MG TBDP Take 75 mg by mouth as needed (Take 1 tablet as needed for migraine, max 1 tablet in 24 hours). 8 tablet 5  . tiZANidine (ZANAFLEX) 4 MG tablet Take 1 tablet (4 mg total) by mouth every 6 (six) hours as needed for muscle spasms. 30 tablet 6  . Ubrogepant (UBRELVY) 50 MG TABS Take 50 mg by mouth as needed (may repeat once 2 hrs after initial dose). 12 tablet 3  . zolmitriptan (ZOMIG) 5 MG tablet Take 1 tablet (5 mg total) by mouth as needed for migraine. As need, 2 hours apart, maximum 2 tabs in 24 hours. 12 tablet 6  . butalbital-acetaminophen-caffeine (FIORICET) 50-325-40 MG tablet TAKE 1 TABLET BY MOUTH EVERY 8 HOURS AS NEEDED FOR MIGRAINE, NOT TO EXCEED 12 TABLETS PER MONTH 12 tablet 0  . fluconazole (DIFLUCAN) 150 MG tablet Take 1 tablet (150 mg total) by mouth daily. Repeat in 1 week if needed 2 tablet 0  . UNABLE TO FIND Med Name: Allergy injections. (Does herself.)     No facility-administered medications prior to visit.    PAST MEDICAL HISTORY: Past Medical History:  Diagnosis Date  . Allergic rhinitis   . Anxiety   . Chiari malformation   . DDD (degenerative disc disease), cervical   . DDD (degenerative disc disease), cervical   . Degenerative disc disease, lumbar   . Depression   . Fibromyalgia   . Hyperlipemia   . Migraine   . Scoliosis   . WPW (Wolff-Parkinson-White syndrome)     PAST SURGICAL HISTORY: Past Surgical History:  Procedure Laterality Date  . CESAREAN SECTION    . CYSTOSCOPY W/ URETERAL STENT PLACEMENT Right 04/25/2015   Procedure: CYSTOSCOPY WITH RETROGRADE PYELOGRAM/URETERAL STENT PLACEMENT;  Surgeon: 06/25/2015, MD;  Location: [facility];  Service: Urology;  Laterality: Right;  . ears Bilateral   . ESOPHAGOGASTRODUODENOSCOPY (EGD) WITH PROPOFOL N/A 12/05/2017   Procedure: ESOPHAGOGASTRODUODENOSCOPY (EGD) WITH  PROPOFOL;  Surgeon: 12/07/2017, MD;  Location: [facility];  Service: Endoscopy;  Laterality: N/A;  . EYE SURGERY Bilateral   . herneated bowel repair    . HERNIA REPAIR    . HOLMIUM LASER APPLICATION Right 04/25/2015   Procedure: HOLMIUM LASER APPLICATION;  Surgeon: 06/25/2015, MD;  Location: [facility];  Service: Urology;  Laterality: Right;  . KNEE SURGERY Bilateral    Right Knee x 1 - Left x3  . SAVORY DILATION N/A 12/05/2017   Procedure: SAVORY DILATION;  Surgeon: 12/07/2017, MD;  Location: [facility];  Service: Endoscopy;  Laterality: N/A;  . TUBAL LIGATION    .  vein removed Left    arm    FAMILY HISTORY: Family History  Problem Relation Age of Onset  . Migraines Mother   . CAD Father   . Migraines Daughter     SOCIAL HISTORY: Social History   Socioeconomic History  . Marital status: Divorced    Spouse name: Not on file  . Number of children: 2  . Years of education: 64  . Highest education level: Not on file  Occupational History    Comment: Not working  Tobacco Use  . Smoking status: Current Every Day Smoker    Packs/day: 0.30    Types: Cigarettes  . Smokeless tobacco: Never Used  Substance and Sexual Activity  . Alcohol use: No    Alcohol/week: 0.0 standard drinks  . Drug use: No  . Sexual activity: Not on file  Other Topics Concern  . Not on file  Social History Narrative   Patient lives at home with her two children and her mother.   Unemployed. - Patient trying to get social security.   Education high school.   Right handed.   Caffeine soda's four soda's daily.   Social Determinants of Health   Financial Resource Strain:   . Difficulty of Paying Living Expenses:   Food Insecurity:   . Worried About Charity fundraiser in the Last Year:   . Arboriculturist in the Last Year:   Transportation Needs:   . Film/video editor (Medical):   Marland Kitchen Lack of Transportation (Non-Medical):   Physical Activity:   . Days of Exercise per Week:   .  Minutes of Exercise per Session:   Stress:   . Feeling of Stress :   Social Connections:   . Frequency of Communication with Friends and Family:   . Frequency of Social Gatherings with Friends and Family:   . Attends Religious Services:   . Active Member of Clubs or Organizations:   . Attends Archivist Meetings:   Marland Kitchen Marital Status:   Intimate Partner Violence:   . Fear of Current or Ex-Partner:   . Emotionally Abused:   Marland Kitchen Physically Abused:   . Sexually Abused:    PHYSICAL EXAM  Vitals:   06/11/19 1302  BP: 118/82  Pulse: 62  Temp: (!) 97.2 F (36.2 C)  Weight: 177 lb (80.3 kg)  Height: 5\' 4"  (1.626 m)   Body mass index is 30.38 kg/m.  Generalized: Well developed, in no acute distress   Neurological examination  Mentation: Alert oriented to time, place, history taking. Follows all commands speech and language fluent Cranial nerve II-XII: Pupils were equal round reactive to light. Extraocular movements were full, visual field were full on confrontational test. Facial sensation and strength were normal.  Head turning and shoulder shrug  were normal and symmetric. Motor: The motor testing reveals 5 over 5 strength of all 4 extremities. Good symmetric motor tone is noted throughout.  Sensory: Sensory testing is intact to soft touch on all 4 extremities. No evidence of extinction is noted.  Coordination: Cerebellar testing reveals good finger-nose-finger and heel-to-shin bilaterally.  Gait and station: Slow to rise from seated position, gait is antalgic due to back pain Reflexes: Deep tendon reflexes are symmetric and normal bilaterally.   DIAGNOSTIC DATA (LABS, IMAGING, TESTING) - I reviewed patient records, labs, notes, testing and imaging myself where available.  Lab Results  Component Value Date   WBC 10.1 01/09/2018   HGB 14.5 01/09/2018   HCT 44.9 01/09/2018  MCV 92.4 01/09/2018   PLT 202 01/09/2018      Component Value Date/Time   NA 139 01/09/2018  1411   K 3.9 01/09/2018 1411   CL 106 01/09/2018 1411   CO2 21 (L) 01/09/2018 1411   GLUCOSE 93 01/09/2018 1411   BUN 9 01/09/2018 1411   CREATININE 0.76 01/09/2018 1411   CALCIUM 9.0 01/09/2018 1411   PROT 8.2 (H) 01/09/2018 1411   ALBUMIN 4.3 01/09/2018 1411   AST 24 01/09/2018 1411   ALT 23 01/09/2018 1411   ALKPHOS 63 01/09/2018 1411   BILITOT 1.0 01/09/2018 1411   GFRNONAA >60 01/09/2018 1411   GFRAA >60 01/09/2018 1411   No results found for: CHOL, HDL, LDLCALC, LDLDIRECT, TRIG, CHOLHDL No results found for: ZOXW9UHGBA1C No results found for: VITAMINB12 No results found for: TSH   ASSESSMENT AND PLAN 44 y.o. year old female  has a past medical history of Allergic rhinitis, Anxiety, Chiari malformation, DDD (degenerative disc disease), cervical, DDD (degenerative disc disease), cervical, Degenerative disc disease, lumbar, Depression, Fibromyalgia, Hyperlipemia, Migraine, Scoliosis, and WPW (Wolff-Parkinson-White syndrome). here with:  1.  Chronic daily migraine -Reports 18 headache days a month -Has previously tried and failed Botox, Topamax, Fioricet, Flexeril, Imitrex, Zomig, Maxalt, Depakote, Inderal, nortriptyline -MRI of the brain in October 2019 showed single isolated right deep frontal gliosis, most consistent with chronic migraine headache, small vessel disease -[place] will be reloc[place] to [place], will establish with another neurologist -Continue Aimovig 140 mg monthly injection -Continue Nurtec (gave 12 tablets), tizanidine, Benadryl for acute headache, she remains on oxycodone and Lyrica from pain management -She will follow-up here on as-needed basis, she will be relocating, refills were sent  I spent 20 minutes of face-to-face and non-face-to-face time with patient.  This included previsit chart review, lab review, study review, order entry, electronic health record documentation, patient education.  Margie EgeSarah Shadiamond Koska, AGNP-C, DNP 06/11/2019, 1:43 PM [facility] [address], [address] [place], [place] [id number] [phone]

## 2019-06-11 NOTE — Telephone Encounter (Signed)
Pt called stating that the pharmacy informed her that her Rimegepant Sulfate (NURTEC) 75 MG TBDP is needing a PA Please advise.

## 2019-06-11 NOTE — Patient Instructions (Signed)
Continue current medications  Establish with a neurologist in [place] Will send refills today

## 2019-06-11 NOTE — Telephone Encounter (Signed)
I called [place] [employer], spoke with Judeth Cornfield. Completed PA for nurtec on phone. PA has to undergo clinical review. Can check status in 24 hours. PA # [id number].

## 2019-06-12 NOTE — Telephone Encounter (Signed)
[place] tracks portal says Nurtec denied but no reason has been given.

## 2019-06-12 NOTE — Telephone Encounter (Signed)
I let the pt know we have a denial but no reason noted. Pt stated she thinks it has to do with the quantity. She said the pharmacy can dispense 8 or 16. If pt cannot get Nurtec she would be willing to try Ubrelvy. Pt aware this would require PA as well. Will see what Maralyn Sago NP says.

## 2019-06-12 NOTE — Telephone Encounter (Signed)
Pt called wanting to know what the update is on her Rimegepant Sulfate (NURTEC) 75 MG TBDP Please advise.

## 2019-06-13 MED ORDER — NURTEC 75 MG PO TBDP: 75.0000 mg | ORAL_TABLET | ORAL | 5 refills | Status: DC | PRN

## 2019-06-13 NOTE — Telephone Encounter (Signed)
She can have 16 tablets a month of Nurtec.

## 2019-06-13 NOTE — Addendum Note (Signed)
Addended by: Bertram Savin on: 06/13/2019 10:02 AM   Modules accepted: Orders

## 2019-06-13 NOTE — Telephone Encounter (Signed)
I changed the Nurtec quantity to 16 tablets and sent back to [facility].

## 2019-06-17 NOTE — Telephone Encounter (Signed)
I am guessing Nurtec was denied, as we requested more tablets per patient request.  At this point, she has previously tried and failed Fioricet, Flexeril, Imitrex, Zomig, Relpax, Maxalt, Ubrelvy.  She is already taking oxycodone and Lyrica.  We may try diclofenac potassium, if she has not already tried. Should we retry Nurtec at the quantity of tablets of 8 that was approved before? Nothing seems to have been especially helpful

## 2019-06-17 NOTE — Telephone Encounter (Signed)
Pt has called to inform she has received notification from Medicare that they are denying her Nurtec because she has more than 13 headaches a month.  Pt is asking for a call to discuss other options.

## 2019-06-18 MED ORDER — NURTEC 75 MG PO TBDP: 75.0000 mg | ORAL_TABLET | ORAL | 5 refills | Status: DC | PRN

## 2019-06-18 NOTE — Addendum Note (Signed)
Addended by: Glean Salvo on: 06/18/2019 10:42 AM   Modules accepted: Orders

## 2019-06-18 NOTE — Telephone Encounter (Signed)
Pt called back wanting to know the update on her migraine medication. Pt states Medicaid will not cover 8 tablets and she is really needing something for her pain. Please advise.

## 2019-06-18 NOTE — Telephone Encounter (Signed)
I called [place] [employer] and spoke with Shawan(?). She stated the reason for the denial was based on the clinical criteria questions. The medication will not be approved when the pt has more than 15 headache days per month. She said this would insinuate that the pt would need to be on a preventive. I let her know the patient is on one and has tried and failed others but unfortunately it is still denied because of their specific criteria. There was nothing further I could do at the time of the call.

## 2019-06-18 NOTE — Telephone Encounter (Signed)
I sent the rx for nurtec, 8 tablets

## 2019-06-18 NOTE — Telephone Encounter (Signed)
Pt has called to f/u.  Brooke [name] was unable to speak with pt but phone rep was asked to send phone note and to really the following to [id number] let her know we may try the 8 tablets instead and see if medicaid approves

## 2019-06-19 NOTE — Telephone Encounter (Signed)
I spoke with the patient. We discussed that she had tried and failed many medications and I relayed Sarah's message about recommendations to establish with local neurologist. Pt is indeed now in [place]. She said she cannot even call anyone to get an appointment until after the 1st of May. We discussed that unfortunately Nurtec is not approved because she did not meet the criteria. Pt was understandably frustrated. She is willing to the try the Diclofenac Sodium. Unfortunately the call connection was interrupted and there was silence followed by call drop. I called the pt back again but still there was a bad connection. The call air dropped again. I called pt back once more and we were able to complete the call. Will send message to Maralyn Sago NP for Diclofenac trial.

## 2019-06-19 NOTE — Telephone Encounter (Signed)
I believe she has moved at this point, I suggest she establish with a neurologist in her new location.  At this point, she has previously tried and failed Fioricet, Flexeril, Imitrex, Zomig, Relpax, Maxalt, Ubrelvy.  She is already prescribed oxycodone and Lyrica.  She could try diclofenac potassium if she can tolerate NSAIDs.  We could retry ordering Bernita Raisin, however I suspect it will be the same as Nurtec.  She has previously tried and failed Botox. If she lived here, we could possibly try to help her with sample Nurtec.

## 2019-06-20 MED ORDER — UBRELVY 50 MG PO TABS: 50.0000 mg | ORAL_TABLET | ORAL | 11 refills | Status: AC | PRN

## 2019-06-20 NOTE — Addendum Note (Signed)
Addended by: Glean Salvo on: 06/20/2019 11:48 AM   Modules accepted: Orders

## 2019-06-20 NOTE — Telephone Encounter (Signed)
I will try Ubrelvy. Rx sent, Nurtec discontinued.

## 2019-06-20 NOTE — Telephone Encounter (Signed)
I spoke with patient and let her know Brooke [name] discontinued Nurtec and has ordered Brooke [name] to try again. Her questions were answered. Will be on lookout for PA if this is needed.

## 2019-06-20 NOTE — Telephone Encounter (Signed)
I don't see where we completed a Ubrelvy PA so if she was getting it from the pharmacy then you could try to order that again. When I spoke with [place] [facility] on the phone the other day they did not provide me with any options regarding the denial of the Nurtec or options to have it looked again with explanation that she was already on preventives and still had 18 migraine days a month.

## 2019-06-20 NOTE — Telephone Encounter (Signed)
I went to send in the diclofenac, there is an allergy reported to Toradol, that is "high" for uncontrollable shakes. She may not be able to tolerate the diclofenac since both NSAIDS.  I called the patient, she does not understand why at one point her insurance was paying for Nurtec and Ubrelvy.  Is a possible, we could speak to someone at [facility] to provide the patient with clarification.  Or try to run the [place] again.  We talked about retrying other options for acute treatment, she said none of those were helpful. I am not sure I will be able to remedy this situation for her completely. She no longer lives here.     ,

## 2019-06-21 ENCOUNTER — Telehealth: Payer: Self-pay | Admitting: Neurology

## 2019-06-21 NOTE — Telephone Encounter (Signed)
Pt has called asking that Sarah,NP be aware that a prior auth is also needed on her Ubrogepant (UBRELVY) 50 MG TABS

## 2019-06-24 NOTE — Telephone Encounter (Addendum)
Completed and fax confirmation received.  [employer]ACK[facility] [phone]. determination pending.

## 2019-06-24 NOTE — Telephone Encounter (Signed)
Medicaid for completed for pt and to be signed by SS/NP for Ubrelvy.

## 2019-06-24 NOTE — Telephone Encounter (Signed)
Completed [employer] for UBRELVY.  Completed form and will fax when signed.

## 2019-06-25 NOTE — Telephone Encounter (Signed)
Received denial from [place] [facility]. Conf# [id number] for Ubrelvy.

## 2019-06-26 NOTE — Telephone Encounter (Addendum)
Spoke to pt. Relayed that denied by medicaid the ubrelvy and nurtec.  SS/NP offered any prior meds to get by until seen by her new pcp/neuro.  She had new [place] Medicaid ID [id number].  I told her that I did not know if we could do this since being in [place] and her now in [place].  She stated the other triptans did not work.  [facility] and see if they can transfer her medications and she get with her new medicaid card.

## 2019-06-26 NOTE — Telephone Encounter (Signed)
You may let the patient know. I am not sure what else can be done for her. She no longer lives here. I am willing to retry a previous medication if something else was more helpful. I think her best option is to get in with a provider in her new state.

## 2019-06-26 NOTE — Telephone Encounter (Signed)
Patient called stating she was informed a PA would need to be processed with [place] medicaid that would allow her to have enough until she is able to establish with a  Neurologist in [place]   Pt states a VM can be left

## 2019-06-27 NOTE — Telephone Encounter (Signed)
I called pt after speaking to [place] [employer] [phone] to try to get PA on Ubrel[employer] for pt.  Since providers are not contracted in [place] they would not be able to do PA.  I relayed to pt and she will try to get help pcp or neurology ASAP.  Pt appreciated call back.

## 2019-07-02 NOTE — Progress Notes (Signed)
I have reviewed and agreed above plan.
